# Patient Record
Sex: Female | Born: 1980 | Race: Black or African American | Hispanic: No | Smoking: Current every day smoker
Health system: Southern US, Community
[De-identification: ages and names within clinical notes are randomized; demographics above are authoritative.]

## PROBLEM LIST (undated history)

## (undated) DIAGNOSIS — D649 Anemia, unspecified: Secondary | ICD-10-CM

## (undated) DIAGNOSIS — Z973 Presence of spectacles and contact lenses: Secondary | ICD-10-CM

## (undated) DIAGNOSIS — F329 Major depressive disorder, single episode, unspecified: Secondary | ICD-10-CM

## (undated) DIAGNOSIS — N281 Cyst of kidney, acquired: Secondary | ICD-10-CM

## (undated) DIAGNOSIS — R222 Localized swelling, mass and lump, trunk: Secondary | ICD-10-CM

## (undated) DIAGNOSIS — F32A Depression, unspecified: Secondary | ICD-10-CM

## (undated) DIAGNOSIS — Z8619 Personal history of other infectious and parasitic diseases: Secondary | ICD-10-CM

## (undated) DIAGNOSIS — E7800 Pure hypercholesterolemia, unspecified: Secondary | ICD-10-CM

## (undated) DIAGNOSIS — R87629 Unspecified abnormal cytological findings in specimens from vagina: Secondary | ICD-10-CM

## (undated) DIAGNOSIS — K219 Gastro-esophageal reflux disease without esophagitis: Secondary | ICD-10-CM

## (undated) DIAGNOSIS — F419 Anxiety disorder, unspecified: Secondary | ICD-10-CM

## (undated) HISTORY — DX: Anxiety disorder, unspecified: F41.9

## (undated) HISTORY — PX: COLONOSCOPY: SHX174

## (undated) HISTORY — DX: Gastro-esophageal reflux disease without esophagitis: K21.9

## (undated) HISTORY — DX: Pure hypercholesterolemia, unspecified: E78.00

## (undated) HISTORY — PX: RESECTION OF ABDOMINAL MASS: SHX6450

## (undated) HISTORY — DX: Unspecified abnormal cytological findings in specimens from vagina: R87.629

## (undated) HISTORY — DX: Anemia, unspecified: D64.9

---

## 2010-03-25 HISTORY — PX: OTHER SURGICAL HISTORY: SHX169

## 2013-01-26 ENCOUNTER — Ambulatory Visit (INDEPENDENT_AMBULATORY_CARE_PROVIDER_SITE_OTHER): Payer: Medicaid Other | Admitting: Advanced Practice Midwife

## 2013-01-26 ENCOUNTER — Encounter: Payer: Self-pay | Admitting: Advanced Practice Midwife

## 2013-01-26 VITALS — BP 146/94 | HR 85 | Temp 97.5°F | Ht 64.0 in | Wt 253.0 lb

## 2013-01-26 DIAGNOSIS — Z6841 Body Mass Index (BMI) 40.0 and over, adult: Secondary | ICD-10-CM

## 2013-01-26 DIAGNOSIS — Z0000 Encounter for general adult medical examination without abnormal findings: Secondary | ICD-10-CM

## 2013-01-26 DIAGNOSIS — Z01419 Encounter for gynecological examination (general) (routine) without abnormal findings: Secondary | ICD-10-CM | POA: Insufficient documentation

## 2013-01-26 DIAGNOSIS — B009 Herpesviral infection, unspecified: Secondary | ICD-10-CM | POA: Insufficient documentation

## 2013-01-26 DIAGNOSIS — R319 Hematuria, unspecified: Secondary | ICD-10-CM

## 2013-01-26 DIAGNOSIS — N390 Urinary tract infection, site not specified: Secondary | ICD-10-CM

## 2013-01-26 LAB — POCT URINALYSIS DIPSTICK
Bilirubin, UA: NEGATIVE
Glucose, UA: NEGATIVE
Leukocytes, UA: NEGATIVE
Nitrite, UA: NEGATIVE
Protein, UA: NEGATIVE
Urobilinogen, UA: NEGATIVE

## 2013-01-26 NOTE — Progress Notes (Signed)
Patient is currently using birth control pills - but does not remember the name.  She also would like to check if she has a UTI.

## 2013-01-26 NOTE — Progress Notes (Addendum)
Patient ID: Ellen Booker, female   DOB: [birth year], 32 y.o.   MRN: [id number]  Chief Complaint  Patient presents with  . Gynecologic Exam    HPI Ellen Booker is a 32 y.o. female.  Here for annual exam and to establish care.  Gynecologic Exam The patient's primary symptoms include pelvic pain. Primary symptoms comment: Patient reports lower abdominal pain and hematuria 2 weeks ago w/ resolution. The problem has been resolved. She is not pregnant. Associated symptoms include abdominal pain and hematuria. Nothing aggravates the symptoms. She is sexually active. No, her partner does not have an STD. She uses oral contraceptives for contraception. Her menstrual history has been regular. Her past medical history is significant for a Cesarean section.   Patient reports recent weight gain of 15 lbs. Plans to exercise and diet for weight loss. Denies history of elevated BP.  Patient had her c-section for FTP, IOL for oligohydraminos. Has a 35 yo daughter at home. 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 Recently moved from [place] because of the cost of living.   Patient reports history of chronic BV and yeast infections. Reports resolution of BV following probiotic use. Still continues to get occasional yeast infections.  History reviewed. No pertinent past medical history.  Past Surgical History  Procedure Laterality Date  . Uterine fibroid surgery      Family History  Problem Relation Age of Onset  . Family history unknown: Yes    Social History History  Substance Use Topics  . Smoking status: Current Every Day Smoker -- 0.50 packs/day for 15 years    Types: Cigarettes  . Smokeless tobacco: Not on file  . Alcohol Use: Yes    No Known Allergies  Current Outpatient Prescriptions  Medication Sig Dispense Refill  . valACYclovir (VALTREX) 500 MG tablet Take 500 mg by mouth 2 (two) times daily.       No current facility-administered medications for this visit.    Review of  Systems Review of Systems  Constitutional: Negative.   HENT: Negative.   Eyes: Negative.   Respiratory: Negative.   Cardiovascular: Negative.   Gastrointestinal: Positive for abdominal pain.  Endocrine: Negative.   Genitourinary: Positive for hematuria and pelvic pain.  Musculoskeletal: Negative.   Skin: Negative.   Allergic/Immunologic: Negative.   Neurological: Negative.   Hematological: Negative.   Psychiatric/Behavioral: Negative.     Blood pressure 146/94, pulse 85, temperature 97.5 F (36.4 C), temperature source Oral, height 5\' 4"  (1.626 m), weight 253 lb (114.76 kg), last menstrual period 01/16/2013.  Repeat BP 127/81  Physical Exam Physical Exam  Constitutional: She is oriented to person, place, and time. She appears well-developed.  Eyes: Pupils are equal, round, and reactive to light.  Neck: Normal range of motion. Neck supple. No thyromegaly present.  Cardiovascular: Normal rate, regular rhythm and normal heart sounds.   Pulmonary/Chest: Effort normal and breath sounds normal.  Abdominal: Soft. Bowel sounds are normal.  Genitourinary: Vagina normal and uterus normal. No vaginal discharge found.  Musculoskeletal: Normal range of motion.  Neurological: She is alert and oriented to person, place, and time.  Skin: Skin is warm and dry.  Psychiatric: She has a normal mood and affect. Her behavior is normal. Judgment and thought content normal.    Data Reviewed Patient reports recent health panel labs drawn and WNL  Assessment    Patient Active Problem List   Diagnosis Date Noted  . 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 Well woman exam with routine  gynecological exam 01/26/2013  . BMI 40.0-44.9, adult 01/26/2013  . Elevated BP 01/26/2013   History of yeast infection Recent hematuria and pelvic pain w/ resolution of syptoms    Plan    Patient to RTC for BP check. If BP normal ok to refill OCP. Patient will bring in name of OCP.  Encouraged  weight loss and exercise Pap today, GC/CT Urine culture, patient to RTC if symptomatic Monitor BP, reviewed importance w/ patient.  Will renew Valtrex PRN. Reviewed methods to help prevent BV and yeast infections.   40 min spent with patient greater than 80% spent in counseling and coordination of care.      + UTI w/ culture. Called Macrobid to pharmacy 100 mg PO BID. Patient to use condoms while on OCP and antibiotic.   Ellen Booker 01/26/2013, 4:53 PM

## 2013-01-27 LAB — PAP IG, CT-NG, RFX HPV ASCU

## 2013-01-28 LAB — URINE CULTURE: Colony Count: 45000

## 2013-01-29 ENCOUNTER — Other Ambulatory Visit: Payer: Self-pay | Admitting: Advanced Practice Midwife

## 2013-01-29 DIAGNOSIS — N390 Urinary tract infection, site not specified: Secondary | ICD-10-CM | POA: Insufficient documentation

## 2013-01-29 MED ORDER — NITROFURANTOIN MONOHYD MACRO 100 MG PO CAPS: 100.0000 mg | ORAL_CAPSULE | Freq: Two times a day (BID) | ORAL | Status: DC

## 2013-01-29 MED ORDER — FLUCONAZOLE 150 MG PO TABS: 150.0000 mg | ORAL_TABLET | Freq: Once | ORAL | Status: DC

## 2013-01-29 NOTE — Addendum Note (Signed)
Addended byWilson Singer, Muzammil Bruins H on: 01/29/2013 05:53 PM   Modules accepted: Orders

## 2013-03-31 ENCOUNTER — Telehealth: Payer: Self-pay | Admitting: *Deleted

## 2013-03-31 ENCOUNTER — Ambulatory Visit (INDEPENDENT_AMBULATORY_CARE_PROVIDER_SITE_OTHER): Payer: Medicaid Other | Admitting: *Deleted

## 2013-03-31 VITALS — BP 133/81 | HR 92 | Temp 98.6°F

## 2013-03-31 DIAGNOSIS — Z0130 Encounter for examination of blood pressure without abnormal findings: Secondary | ICD-10-CM

## 2013-03-31 DIAGNOSIS — Z136 Encounter for screening for cardiovascular disorders: Secondary | ICD-10-CM

## 2013-03-31 MED ORDER — VALACYCLOVIR HCL 500 MG PO TABS: 500.0000 mg | ORAL_TABLET | Freq: Two times a day (BID) | ORAL | Status: DC

## 2013-03-31 MED ORDER — FLUCONAZOLE 150 MG PO TABS: 150.0000 mg | ORAL_TABLET | Freq: Once | ORAL | Status: DC

## 2013-03-31 NOTE — Progress Notes (Signed)
Pt in office today for BP check, 133/81

## 2013-05-31 ENCOUNTER — Other Ambulatory Visit: Payer: Self-pay | Admitting: *Deleted

## 2013-05-31 DIAGNOSIS — B379 Candidiasis, unspecified: Secondary | ICD-10-CM

## 2013-05-31 MED ORDER — FLUCONAZOLE 150 MG PO TABS: 150.0000 mg | ORAL_TABLET | Freq: Once | ORAL | Status: DC

## 2013-06-24 ENCOUNTER — Other Ambulatory Visit: Payer: Self-pay | Admitting: Advanced Practice Midwife

## 2013-06-24 DIAGNOSIS — IMO0001 Reserved for inherently not codable concepts without codable children: Secondary | ICD-10-CM

## 2013-06-24 MED ORDER — NORETHIN ACE-ETH ESTRAD-FE 1-20 MG-MCG PO TABS: 1.0000 | ORAL_TABLET | Freq: Every day | ORAL | Status: DC

## 2013-07-15 ENCOUNTER — Other Ambulatory Visit: Payer: Self-pay | Admitting: *Deleted

## 2013-07-15 DIAGNOSIS — IMO0001 Reserved for inherently not codable concepts without codable children: Secondary | ICD-10-CM

## 2013-07-15 DIAGNOSIS — B009 Herpesviral infection, unspecified: Secondary | ICD-10-CM

## 2013-07-15 MED ORDER — VALACYCLOVIR HCL 500 MG PO TABS: 500.0000 mg | ORAL_TABLET | Freq: Two times a day (BID) | ORAL | Status: DC

## 2013-07-15 MED ORDER — NORETHIN ACE-ETH ESTRAD-FE 1-20 MG-MCG PO TABS: 1.0000 | ORAL_TABLET | Freq: Every day | ORAL | Status: DC

## 2013-11-08 ENCOUNTER — Ambulatory Visit (INDEPENDENT_AMBULATORY_CARE_PROVIDER_SITE_OTHER): Payer: Medicaid Other | Admitting: Obstetrics

## 2013-11-08 ENCOUNTER — Other Ambulatory Visit: Payer: Self-pay | Admitting: Obstetrics

## 2013-11-08 ENCOUNTER — Encounter: Payer: Self-pay | Admitting: Obstetrics

## 2013-11-08 VITALS — BP 143/91 | HR 88 | Temp 98.4°F | Wt 257.0 lb

## 2013-11-08 DIAGNOSIS — N6459 Other signs and symptoms in breast: Secondary | ICD-10-CM

## 2013-11-08 DIAGNOSIS — N644 Mastodynia: Secondary | ICD-10-CM

## 2013-11-08 DIAGNOSIS — N6452 Nipple discharge: Secondary | ICD-10-CM | POA: Insufficient documentation

## 2013-11-08 DIAGNOSIS — N926 Irregular menstruation, unspecified: Secondary | ICD-10-CM | POA: Insufficient documentation

## 2013-11-08 DIAGNOSIS — N939 Abnormal uterine and vaginal bleeding, unspecified: Principal | ICD-10-CM

## 2013-11-08 NOTE — Progress Notes (Signed)
Patient ID: Ellen Booker, female   DOB: [birth year], 33 y.o.   MRN: [id number]  Chief Complaint  Patient presents with  . Breast Problem    breast leaking    HPI Ellen Booker is a 33 y.o. female.  Bleeding in between periods for last few months.  Also c/o breast pain and clear nipple discharge. HPI  History reviewed. No pertinent past medical history.  Past Surgical History  Procedure Laterality Date  . Uterine fibroid surgery      History reviewed. No pertinent family history.  Social History History  Substance Use Topics  . Smoking status: Current Every Day Smoker -- 0.50 packs/day for 15 years    Types: Cigarettes  . Smokeless tobacco: Not on file  . Alcohol Use: Yes    No Known Allergies  Current Outpatient Prescriptions  Medication Sig Dispense Refill  . norethindrone-ethinyl estradiol (JUNEL FE,GILDESS FE,LOESTRIN FE) 1-20 MG-MCG tablet Take 1 tablet by mouth daily.  1 Package  9  . valACYclovir (VALTREX) 500 MG tablet Take 1 tablet (500 mg total) by mouth 2 (two) times daily.  60 tablet  11  . fluconazole (DIFLUCAN) 150 MG tablet Take 1 tablet (150 mg total) by mouth once.  1 tablet  0   No current facility-administered medications for this visit.    Review of Systems Review of Systems Constitutional: negative for fatigue and weight loss Respiratory: negative for cough and wheezing Cardiovascular: negative for chest pain, fatigue and palpitations Gastrointestinal: negative for abdominal pain and change in bowel habits Genitourinary:  AUB, with more than 1 period per month Integument/breast: negative for nipple discharge Musculoskeletal:negative for myalgias Neurological: negative for gait problems and tremors Behavioral/Psych: negative for abusive relationship, depression Endocrine: negative for temperature intolerance     Blood pressure 143/91, pulse 88, temperature 98.4 F (36.9 C), weight 257 lb (116.574 kg), last menstrual period  10/19/2013.  Physical Exam Physical Exam General:   alert  Skin:   no rash or abnormalities  Lungs:   clear to auscultation bilaterally  Heart:   regular rate and rhythm, S1, S2 normal, no murmur, click, rub or gallop  Breasts:   normal without suspicious masses, skin or nipple changes or axillary nodes  Abdomen:  normal findings: no organomegaly, soft, non-tender and no hernia  Pelvis:  External genitalia: normal general appearance Urinary system: urethral meatus normal and bladder without fullness, nontender Vaginal: normal without tenderness, induration or masses Cervix: normal appearance Adnexa: normal bimanual exam Uterus: anteverted and non-tender, normal size      Data Reviewed Labs  Assessment    AUB. Mastodynia     Plan   Ultrasound ordered. Endometrial biopsy needed on next visit. F/U in 2 weeks.   Orders Placed This Encounter  Procedures  . WET PREP BY MOLECULAR PROBE  . GC/Chlamydia Probe Amp  . US Transvaginal Non-OB    Standing Status: Future     Number of Occurrences:      Standing Expiration Date: 01/09/2015    Order Specific Question:  Reason for Exam (SYMPTOM  OR DIAGNOSIS REQUIRED)    Answer:  AUB    Order Specific Question:  Preferred imaging location?    Answer:  [facility]  . US Pelvis Complete    Standing Status: Future     Number of Occurrences:      Standing Expiration Date: 01/09/2015    Order Specific Question:  Reason for Exam (SYMPTOM  OR DIAGNOSIS REQUIRED)    Answer:  AUB  Order Specific Question:  Preferred imaging location?    Answer[facility]  . MM Digital Screening    Standing Status: Future     Number of Occurrences:      Standing Expiration Date: 01/09/2015    Order Specific Question:  Reason for Exam (SYMPTOM  OR DIAGNOSIS REQUIRED)    Answer:  Breast pain and nipple discharge.    Order Specific Question:  Is the patient pregnant?    Answer:  No    Order Specific Question:  Preferred imaging location?     Answer[facility]  . POCT urine pregnancy   No orders of the defined types were placed in this encounter.        Faatima Tench A 11/08/2013, 5:36 PM

## 2013-11-09 LAB — WET PREP BY MOLECULAR PROBE
Candida species: NEGATIVE
GARDNERELLA VAGINALIS: NEGATIVE
Trichomonas vaginosis: NEGATIVE

## 2013-11-10 LAB — GC/CHLAMYDIA PROBE AMP
CT Probe RNA: NEGATIVE
GC Probe RNA: NEGATIVE

## 2013-11-11 ENCOUNTER — Telehealth: Payer: Self-pay | Admitting: *Deleted

## 2013-11-11 NOTE — Telephone Encounter (Signed)
Patient advised test results are normal.

## 2013-11-12 ENCOUNTER — Ambulatory Visit (HOSPITAL_COMMUNITY)
Admission: RE | Admit: 2013-11-12 | Discharge: 2013-11-12 | Disposition: A | Discharge status: Home / Self Care | Payer: Medicaid Other | Source: Ambulatory Visit | Attending: Obstetrics | Admitting: Obstetrics

## 2013-11-12 DIAGNOSIS — N938 Other specified abnormal uterine and vaginal bleeding: Secondary | ICD-10-CM | POA: Diagnosis not present

## 2013-11-12 DIAGNOSIS — N949 Unspecified condition associated with female genital organs and menstrual cycle: Secondary | ICD-10-CM | POA: Diagnosis present

## 2013-11-12 DIAGNOSIS — N926 Irregular menstruation, unspecified: Secondary | ICD-10-CM

## 2013-11-12 DIAGNOSIS — N925 Other specified irregular menstruation: Secondary | ICD-10-CM | POA: Insufficient documentation

## 2013-11-12 DIAGNOSIS — N939 Abnormal uterine and vaginal bleeding, unspecified: Secondary | ICD-10-CM

## 2013-12-01 ENCOUNTER — Ambulatory Visit: Payer: Medicaid Other | Admitting: Obstetrics

## 2013-12-01 ENCOUNTER — Ambulatory Visit: Payer: Medicaid Other | Admitting: Obstetrics & Gynecology

## 2013-12-08 ENCOUNTER — Ambulatory Visit (INDEPENDENT_AMBULATORY_CARE_PROVIDER_SITE_OTHER): Payer: Medicaid Other | Admitting: Obstetrics

## 2013-12-08 ENCOUNTER — Ambulatory Visit: Payer: Medicaid Other | Admitting: Obstetrics

## 2013-12-08 ENCOUNTER — Encounter: Payer: Self-pay | Admitting: Obstetrics

## 2013-12-08 VITALS — BP 155/91 | HR 92 | Temp 98.6°F | Ht 64.0 in | Wt 255.0 lb

## 2013-12-08 DIAGNOSIS — E669 Obesity, unspecified: Secondary | ICD-10-CM

## 2013-12-08 DIAGNOSIS — Z3041 Encounter for surveillance of contraceptive pills: Secondary | ICD-10-CM

## 2013-12-08 DIAGNOSIS — B3731 Acute candidiasis of vulva and vagina: Secondary | ICD-10-CM | POA: Insufficient documentation

## 2013-12-08 DIAGNOSIS — B373 Candidiasis of vulva and vagina: Secondary | ICD-10-CM

## 2013-12-08 DIAGNOSIS — I10 Essential (primary) hypertension: Secondary | ICD-10-CM

## 2013-12-08 MED ORDER — FLUCONAZOLE 150 MG PO TABS: 150.0000 mg | ORAL_TABLET | Freq: Once | ORAL | Status: DC

## 2013-12-09 ENCOUNTER — Other Ambulatory Visit: Payer: Self-pay | Admitting: Obstetrics

## 2013-12-09 ENCOUNTER — Encounter: Payer: Self-pay | Admitting: Obstetrics

## 2013-12-09 DIAGNOSIS — B9689 Other specified bacterial agents as the cause of diseases classified elsewhere: Secondary | ICD-10-CM

## 2013-12-09 DIAGNOSIS — N760 Acute vaginitis: Principal | ICD-10-CM

## 2013-12-09 DIAGNOSIS — I10 Essential (primary) hypertension: Secondary | ICD-10-CM | POA: Insufficient documentation

## 2013-12-09 DIAGNOSIS — E669 Obesity, unspecified: Secondary | ICD-10-CM | POA: Insufficient documentation

## 2013-12-09 LAB — WET PREP BY MOLECULAR PROBE
Candida species: POSITIVE — AB
GARDNERELLA VAGINALIS: POSITIVE — AB
TRICHOMONAS VAG: NEGATIVE

## 2013-12-09 MED ORDER — METRONIDAZOLE 500 MG PO TABS: 500.0000 mg | ORAL_TABLET | Freq: Two times a day (BID) | ORAL | Status: DC

## 2013-12-09 NOTE — Progress Notes (Addendum)
Subjective:    Ellen Booker is a 32 y.o. female who presents for contraception counseling. The patient has no complaints today. The patient is not currently sexually active. Pertinent past medical history: current smoker.  The information documented in the HPI was reviewed and verified.  Menstrual History: OB History   Grav Para Term Preterm Abortions TAB SAB Ect Mult Living   Patient's last menstrual period was 11/28/2013.    Past Medical History  Diagnosis Date   Helicobacter pylori (H. pylori)     Past Surgical History  Procedure Laterality Date   Uterine fibroid surgery      Current outpatient prescriptions:fluconazole (DIFLUCAN) 150 MG tablet, Take 1 tablet (150 mg total) by mouth once., Disp: 1 tablet, Rfl: 4;  norethindrone-ethinyl estradiol (JUNEL FE,GILDESS FE,LOESTRIN FE) 1-20 MG-MCG tablet, Take 1 tablet by mouth daily., Disp: 1 Package, Rfl: 9;  valACYclovir (VALTREX) 500 MG tablet, Take 1 tablet (500 mg total) by mouth 2 (two) times daily., Disp: 60 tablet, Rfl: 11 No Known Allergies  History  Substance Use Topics   Smoking status: Current Every Day Smoker -- 0.50 packs/day for 15 years    Types: Cigarettes   Smokeless tobacco: Never Used   Alcohol Use: Yes    History reviewed. No pertinent family history.     Review of Systems Constitutional: negative for weight loss Genitourinary:negative for abnormal menstrual periods and vaginal discharge   Objective:   BP 155/91  Pulse 92  Temp(Src) 98.6 F (37 C)  Ht  (1.626 m)  Wt 255 lb (115.667 kg)  BMI 43.75 kg/m2  LMP 11/28/2013   Lab Review Urine pregnancy test Labs reviewed yes Radiologic studies reviewed no    Assessment:    33 y.o., continuing OCP (estrogen/progesterone), contraindications smoker .  AUB Obesity Hypertension  Plan:   Stop OCP's Smoking cessation recommended.  All questions answered. Discussed healthy lifestyle modifications. Programmer, multimedia distributed. Combination E/P contraceptives not recommended with concurrent tobacco use.  Nonhormonal contraception, i. e. ParaGuard IUD would be a better choice.   Meds ordered this encounter  Medications   fluconazole (DIFLUCAN) 150 MG tablet    Sig: Take 1 tablet (150 mg total) by mouth once.    Dispense:  1 tablet    Refill:  4   Orders Placed This Encounter  Procedures   WET PREP BY MOLECULAR PROBE

## 2014-01-24 ENCOUNTER — Encounter: Payer: Self-pay | Admitting: Obstetrics

## 2014-02-01 ENCOUNTER — Encounter: Payer: Self-pay | Admitting: Obstetrics

## 2014-02-01 ENCOUNTER — Ambulatory Visit (INDEPENDENT_AMBULATORY_CARE_PROVIDER_SITE_OTHER): Payer: Medicaid Other | Admitting: Obstetrics

## 2014-02-01 VITALS — BP 137/85 | HR 80 | Temp 98.3°F | Ht 64.0 in | Wt 253.0 lb

## 2014-02-01 DIAGNOSIS — Z3041 Encounter for surveillance of contraceptive pills: Secondary | ICD-10-CM

## 2014-02-01 DIAGNOSIS — Z0000 Encounter for general adult medical examination without abnormal findings: Secondary | ICD-10-CM

## 2014-02-01 DIAGNOSIS — N946 Dysmenorrhea, unspecified: Secondary | ICD-10-CM | POA: Insufficient documentation

## 2014-02-01 DIAGNOSIS — B373 Candidiasis of vulva and vagina: Secondary | ICD-10-CM

## 2014-02-01 DIAGNOSIS — E669 Obesity, unspecified: Secondary | ICD-10-CM

## 2014-02-01 DIAGNOSIS — F17200 Nicotine dependence, unspecified, uncomplicated: Secondary | ICD-10-CM | POA: Insufficient documentation

## 2014-02-01 DIAGNOSIS — B3731 Acute candidiasis of vulva and vagina: Secondary | ICD-10-CM

## 2014-02-01 MED ORDER — PHENTERMINE HCL 37.5 MG PO CAPS: 37.5000 mg | ORAL_CAPSULE | ORAL | Status: DC

## 2014-02-01 MED ORDER — FLUCONAZOLE 150 MG PO TABS: 150.0000 mg | ORAL_TABLET | Freq: Once | ORAL | Status: DC

## 2014-02-01 MED ORDER — IBUPROFEN 800 MG PO TABS: 800.0000 mg | ORAL_TABLET | Freq: Three times a day (TID) | ORAL | Status: DC | PRN

## 2014-02-01 NOTE — Progress Notes (Signed)
Subjective:     Ellen Booker is a 33 y.o. female here for a routine exam.  Current complaints: Left inguinal pain with period .    Personal health questionnaire:  Is patient [name], have a family history of breast and/or ovarian cancer: no Is there a family history of uterine cancer diagnosed at age < 5250, gastrointestinal cancer, urinary tract cancer, family member who is a Personnel officerLynch syndrome-associated carrier: no Is the patient overweight and hypertensive, family history of diabetes, personal history of gestational diabetes or PCOS: no Is patient over 8555, have PCOS,  family history of premature CHD under age 165, diabetes, smoke, have hypertension or peripheral artery disease:  no At any time, has a partner hit, kicked or otherwise hurt or frightened you?: no Over the past 2 weeks, have you felt down, depressed or hopeless?: no Over the past 2 weeks, have you felt little interest or pleasure in doing things?:no   Gynecologic History Patient's last menstrual period was 01/24/2014. Contraception: none Last Pap: 2014. Results were: normal Last mammogram: n/a. Results were: n/a  Obstetric History OB History  Gravida Para Term Preterm AB SAB TAB Ectopic Multiple Living  1 1 1       1     # Outcome Date GA Lbr Len/2nd Weight Sex Delivery Anes PTL Lv  1 Term  [id number]    [id number]   Y      Past Medical History  Diagnosis Date  . Helicobacter pylori (H. pylori)     Past Surgical History  Procedure Laterality Date  . Uterine fibroid surgery      Current outpatient prescriptions: valACYclovir (VALTREX) 500 MG tablet, Take 1 tablet (500 mg total) by mouth 2 (two) times daily., Disp: 60 tablet, Rfl: 11;  fluconazole (DIFLUCAN) 150 MG tablet, Take 1 tablet (150 mg total) by mouth once., Disp: 1 tablet, Rfl: 4;  ibuprofen (ADVIL,MOTRIN) 800 MG tablet, Take 1 tablet (800 mg total) by mouth every 8 (eight) hours as needed., Disp: 30 tablet, Rfl: 5 phentermine 37.5 MG capsule, Take 1  capsule (37.5 mg total) by mouth every morning., Disp: 1 capsule, Rfl: 2 No Known Allergies  History  Substance Use Topics  . Smoking status: Current Every Day Smoker -- 0.50 packs/day for 15 years    Types: Cigarettes  . Smokeless tobacco: Never Used  . Alcohol Use: No    History reviewed. No pertinent family history.    Review of Systems  Constitutional: negative for fatigue and weight loss Respiratory: negative for cough and wheezing Cardiovascular: negative for chest pain, fatigue and palpitations Gastrointestinal: negative for abdominal pain and change in bowel habits Musculoskeletal:negative for myalgias Neurological: negative for gait problems and tremors Behavioral/Psych: negative for abusive relationship, depression Endocrine: negative for temperature intolerance   Genitourinary:negative for abnormal menstrual periods, genital lesions, hot flashes, sexual problems and vaginal discharge Integument/breast: negative for breast lump, breast tenderness, nipple discharge and skin lesion(s)    Objective:       BP 137/85 mmHg  Pulse 80  Temp(Src) 98.3 F (36.8 C)  Ht 5\' 4"  (1.626 m)  Wt 253 lb (114.76 kg)  BMI 43.41 kg/m2  LMP 01/24/2014 General:   alert  Skin:   no rash or abnormalities  Lungs:   clear to auscultation bilaterally  Heart:   regular rate and rhythm, S1, S2 normal, no murmur, click, rub or gallop  Breasts:   normal without suspicious masses, skin or nipple changes or axillary nodes  Abdomen:  normal findings: no  organomegaly, soft, non-tender and no hernia  Pelvis:  External genitalia: normal general appearance Urinary system: urethral meatus normal and bladder without fullness, nontender Vaginal: normal without tenderness, induration or masses Cervix: normal appearance Adnexa: normal bimanual exam Uterus: anteverted and non-tender, normal size   Lab Review Urine pregnancy test Labs reviewed yes Radiologic studies reviewed yes    Assessment:     Healthy female exam.    H/O AUB, with irregular cycles.  Dysmenorrhea  Obesity  Contraception.  Started on OCP's but has tobacco dependency.  Must D/C OCP's if she can't stop smoking.    Plan:    Education reviewed: calcium supplements, low fat, low cholesterol diet, safe sex/STD prevention, self breast exams, smoking cessation and weight bearing exercise. Contraception: OCP (estrogen/progesterone). Follow up in: 1 year.   Meds ordered this encounter  Medications  . ibuprofen (ADVIL,MOTRIN) 800 MG tablet    Sig: Take 1 tablet (800 mg total) by mouth every 8 (eight) hours as needed.    Dispense:  30 tablet    Refill:  5  . phentermine 37.5 MG capsule    Sig: Take 1 capsule (37.5 mg total) by mouth every morning.    Dispense:  1 capsule    Refill:  2  . fluconazole (DIFLUCAN) 150 MG tablet    Sig: Take 1 tablet (150 mg total) by mouth once.    Dispense:  1 tablet    Refill:  4   Orders Placed This Encounter  Procedures  . WET PREP BY MOLECULAR PROBE  . GC/Chlamydia Probe Amp

## 2014-02-02 ENCOUNTER — Other Ambulatory Visit: Payer: Self-pay | Admitting: Obstetrics

## 2014-02-02 DIAGNOSIS — B9689 Other specified bacterial agents as the cause of diseases classified elsewhere: Secondary | ICD-10-CM

## 2014-02-02 DIAGNOSIS — N760 Acute vaginitis: Principal | ICD-10-CM

## 2014-02-02 LAB — GC/CHLAMYDIA PROBE AMP
CT Probe RNA: NEGATIVE
GC Probe RNA: NEGATIVE

## 2014-02-02 LAB — WET PREP BY MOLECULAR PROBE
Candida species: NEGATIVE
GARDNERELLA VAGINALIS: NEGATIVE
Trichomonas vaginosis: NEGATIVE

## 2014-02-02 MED ORDER — METRONIDAZOLE 500 MG PO TABS: 500.0000 mg | ORAL_TABLET | Freq: Two times a day (BID) | ORAL | Status: DC

## 2014-02-03 LAB — PAP IG AND HPV HIGH-RISK: HPV DNA HIGH RISK: NOT DETECTED

## 2014-02-28 ENCOUNTER — Telehealth: Payer: Self-pay | Admitting: *Deleted

## 2014-02-28 DIAGNOSIS — B9689 Other specified bacterial agents as the cause of diseases classified elsewhere: Secondary | ICD-10-CM

## 2014-02-28 DIAGNOSIS — N760 Acute vaginitis: Principal | ICD-10-CM

## 2014-02-28 MED ORDER — METRONIDAZOLE 500 MG PO TABS: 500.0000 mg | ORAL_TABLET | Freq: Two times a day (BID) | ORAL | Status: DC

## 2014-02-28 NOTE — Telephone Encounter (Signed)
Patient states she is having a discharge with a foul odor. Patient states she believes it is BV. Per nursing protocol prescription to be sent to the phramacy for Metronidazole.  Patient aware and verbalized understanding.

## 2014-03-21 ENCOUNTER — Encounter: Payer: Self-pay | Admitting: *Deleted

## 2014-03-22 ENCOUNTER — Encounter: Payer: Self-pay | Admitting: Obstetrics & Gynecology

## 2014-04-27 ENCOUNTER — Ambulatory Visit: Payer: Medicaid Other | Admitting: Obstetrics

## 2014-04-27 ENCOUNTER — Telehealth: Payer: Self-pay | Admitting: *Deleted

## 2014-04-27 NOTE — Telephone Encounter (Signed)
Patient states she has an appointment today at 2:30 and she has just had a positive UPT. Patient wants to know if she can keep her appointment because she wants to discuss her options. Called patient advised to keep appoinment.

## 2014-05-19 ENCOUNTER — Ambulatory Visit (INDEPENDENT_AMBULATORY_CARE_PROVIDER_SITE_OTHER): Payer: Medicaid Other | Admitting: Obstetrics

## 2014-05-19 ENCOUNTER — Encounter: Payer: Self-pay | Admitting: Obstetrics

## 2014-05-19 VITALS — BP 140/92 | HR 92 | Temp 98.8°F | Ht 64.0 in | Wt 235.0 lb

## 2014-05-19 DIAGNOSIS — R109 Unspecified abdominal pain: Secondary | ICD-10-CM

## 2014-05-19 DIAGNOSIS — N946 Dysmenorrhea, unspecified: Secondary | ICD-10-CM

## 2014-05-19 LAB — POCT URINE PREGNANCY: PREG TEST UR: NEGATIVE

## 2014-05-20 ENCOUNTER — Encounter: Payer: Self-pay | Admitting: Obstetrics

## 2014-05-20 NOTE — Progress Notes (Signed)
Patient ID: Ellen Booker, female   DOB: 1980/12/31, 34 y.o.   MRN: [id number]  Chief Complaint  Patient presents with  . Problem    Abdominal Pain     HPI Ellen Booker is a 34 y.o. female.  Pelvic pain over the past few months.  H/O uterine fibroids.  S/P myomectomy.  H/O positive H. Pylori.  Pain is just before, during and after period. HPI  Past Medical History  Diagnosis Date  . Helicobacter pylori (H. pylori)     Past Surgical History  Procedure Laterality Date  . Uterine fibroid surgery      History reviewed. No pertinent family history.  Social History History  Substance Use Topics  . Smoking status: Current Every Day Smoker -- 0.50 packs/day for 15 years    Types: Cigarettes  . Smokeless tobacco: Never Used  . Alcohol Use: Yes    No Known Allergies  Current Outpatient Prescriptions  Medication Sig Dispense Refill  . fluconazole (DIFLUCAN) 150 MG tablet Take 1 tablet (150 mg total) by mouth once. 1 tablet 4  . metroNIDAZOLE (FLAGYL) 500 MG tablet Take 1 tablet (500 mg total) by mouth 2 (two) times daily. 14 tablet 2  . valACYclovir (VALTREX) 500 MG tablet Take 1 tablet (500 mg total) by mouth 2 (two) times daily. 60 tablet 11  . ibuprofen (ADVIL,MOTRIN) 800 MG tablet Take 1 tablet (800 mg total) by mouth every 8 (eight) hours as needed. (Patient not taking: Reported on 05/19/2014) 30 tablet 5  . phentermine 37.5 MG capsule Take 1 capsule (37.5 mg total) by mouth every morning. (Patient not taking: Reported on 05/19/2014) 1 capsule 2   No current facility-administered medications for this visit.    Review of Systems Review of Systems Constitutional: negative for fatigue and weight loss Respiratory: negative for cough and wheezing Cardiovascular: negative for chest pain, fatigue and palpitations Gastrointestinal: negative for abdominal pain and change in bowel habits Genitourinary:negative Integument/breast: negative for nipple  discharge Musculoskeletal:negative for myalgias Neurological: negative for gait problems and tremors Behavioral/Psych: negative for abusive relationship, depression Endocrine: negative for temperature intolerance     Blood pressure 140/92, pulse 92, temperature 98.8 F (37.1 C), height 5\' 4"  (1.626 m), weight 235 lb (106.595 kg), last menstrual period 05/02/2014.  Physical Exam Physical Exam General:   alert  Skin:   no rash or abnormalities  Lungs:   clear to auscultation bilaterally  Heart:   regular rate and rhythm, S1, S2 normal, no murmur, click, rub or gallop  Breasts:   normal without suspicious masses, skin or nipple changes or axillary nodes  Abdomen:  normal findings: no organomegaly, soft, non-tender and no hernia  Pelvis:  External genitalia: normal general appearance Urinary system: urethral meatus normal and bladder without fullness, nontender Vaginal: normal without tenderness, induration or masses Cervix: normal appearance Adnexa: normal bimanual exam Uterus: anteverted and non-tender, normal size      Data Reviewed Labs U/S  Assessment     Dysmenorrhea.  H/O fibroids.     Plan    Schedule ultrasound.   Orders Placed This Encounter  Procedures  . SureSwab, Vaginosis/Vaginitis Plus  . POCT urine pregnancy   No orders of the defined types were placed in this encounter.

## 2014-05-23 LAB — SURESWAB, VAGINOSIS/VAGINITIS PLUS
Atopobium vaginae: NOT DETECTED Log (cells/mL)
C. ALBICANS, DNA: NOT DETECTED
C. GLABRATA, DNA: NOT DETECTED
C. parapsilosis, DNA: NOT DETECTED
C. trachomatis RNA, TMA: NOT DETECTED
C. tropicalis, DNA: NOT DETECTED
LACTOBACILLUS SPECIES: NOT DETECTED Log (cells/mL)
MEGASPHAERA SPECIES: NOT DETECTED Log (cells/mL)
N. gonorrhoeae RNA, TMA: NOT DETECTED
T. VAGINALIS RNA, QL TMA: NOT DETECTED

## 2014-06-01 ENCOUNTER — Ambulatory Visit: Payer: Medicaid Other | Admitting: Obstetrics

## 2014-06-01 ENCOUNTER — Other Ambulatory Visit: Payer: Medicaid Other

## 2014-06-07 ENCOUNTER — Other Ambulatory Visit: Payer: Self-pay | Admitting: *Deleted

## 2014-06-07 DIAGNOSIS — B9689 Other specified bacterial agents as the cause of diseases classified elsewhere: Secondary | ICD-10-CM

## 2014-06-07 DIAGNOSIS — N760 Acute vaginitis: Principal | ICD-10-CM

## 2014-06-07 MED ORDER — METRONIDAZOLE 500 MG PO TABS: 500.0000 mg | ORAL_TABLET | Freq: Two times a day (BID) | ORAL | Status: DC

## 2014-06-07 NOTE — Progress Notes (Signed)
Refill request sent from pharmacy for Metronidazole. Per Dr Clearance CootsHarper, ok to sent Rx with 2 refills.

## 2014-06-08 ENCOUNTER — Ambulatory Visit (INDEPENDENT_AMBULATORY_CARE_PROVIDER_SITE_OTHER): Payer: Medicaid Other

## 2014-06-08 ENCOUNTER — Ambulatory Visit (INDEPENDENT_AMBULATORY_CARE_PROVIDER_SITE_OTHER): Payer: Medicaid Other | Admitting: Obstetrics

## 2014-06-08 VITALS — BP 122/80 | HR 75 | Wt 233.0 lb

## 2014-06-08 DIAGNOSIS — N946 Dysmenorrhea, unspecified: Secondary | ICD-10-CM

## 2014-06-08 DIAGNOSIS — R109 Unspecified abdominal pain: Secondary | ICD-10-CM

## 2014-06-09 ENCOUNTER — Encounter: Payer: Self-pay | Admitting: Obstetrics

## 2014-06-09 NOTE — Progress Notes (Signed)
Patient ID: Ellen QuintonVanessa Goehring, female   DOB: 10-28-1980, 34 y.o.   MRN: [id number]  Chief Complaint  Patient presents with  . Follow-up    ultrasound results    HPI Ellen Booker is a 34 y.o. female.  H/O dysmenorrhea.  Follow up after ultrasound.  HPI  Past Medical History  Diagnosis Date  . Helicobacter pylori (H. pylori)     Past Surgical History  Procedure Laterality Date  . Uterine fibroid surgery      History reviewed. No pertinent family history.  Social History History  Substance Use Topics  . Smoking status: Current Every Day Smoker -- 0.50 packs/day for 15 years    Types: Cigarettes  . Smokeless tobacco: Never Used  . Alcohol Use: Yes    No Known Allergies  Current Outpatient Prescriptions  Medication Sig Dispense Refill  . fluconazole (DIFLUCAN) 150 MG tablet Take 1 tablet (150 mg total) by mouth once. (Patient not taking: Reported on 06/08/2014) 1 tablet 4  . ibuprofen (ADVIL,MOTRIN) 800 MG tablet Take 1 tablet (800 mg total) by mouth every 8 (eight) hours as needed. (Patient not taking: Reported on 05/19/2014) 30 tablet 5  . metroNIDAZOLE (FLAGYL) 500 MG tablet Take 1 tablet (500 mg total) by mouth 2 (two) times daily. (Patient not taking: Reported on 06/08/2014) 14 tablet 2  . phentermine 37.5 MG capsule Take 1 capsule (37.5 mg total) by mouth every morning. (Patient not taking: Reported on 05/19/2014) 1 capsule 2  . valACYclovir (VALTREX) 500 MG tablet Take 1 tablet (500 mg total) by mouth 2 (two) times daily. (Patient not taking: Reported on 06/08/2014) 60 tablet 11   No current facility-administered medications for this visit.    Review of Systems Review of Systems Constitutional: negative for fatigue and weight loss Respiratory: negative for cough and wheezing Cardiovascular: negative for chest pain, fatigue and palpitations Gastrointestinal: negative for abdominal pain and change in bowel habits Genitourinary:negative Integument/breast: negative for  nipple discharge Musculoskeletal:negative for myalgias Neurological: negative for gait problems and tremors Behavioral/Psych: negative for abusive relationship, depression Endocrine: negative for temperature intolerance     Blood pressure 122/80, pulse 75, weight 233 lb (105.688 kg), last menstrual period 05/21/2014.  Physical Exam Physical Exam:  Deferred  100% of 10 min visit spent on counseling and coordination of care.   Data Reviewed Labs  Assessment     Dysmenorrhea.  Chronic abdominal pain.  Probable GI etiology    Plan    Continue  NSAIDS.  No orders of the defined types were placed in this encounter.   No orders of the defined types were placed in this encounter.

## 2014-07-01 ENCOUNTER — Other Ambulatory Visit: Payer: Self-pay | Admitting: *Deleted

## 2014-07-01 NOTE — Telephone Encounter (Signed)
Patient is requesting a refill on Phentermine.  Attempted to contact the patient and left message advising patient would forward request to Dr. Clearance CootsHarper and contact her as soon as he advised on what to do.

## 2014-07-01 NOTE — Telephone Encounter (Signed)
Denied.

## 2014-07-06 NOTE — Telephone Encounter (Signed)
Patient left message stating she was not contacted regarding her call Friday.  2:36 Call to patient- LM on VM- Attempt was made to call patient to let her know refill request was denied.

## 2014-07-06 NOTE — Telephone Encounter (Signed)
Attempted to contact patient and left message for patient to call the office.  

## 2014-07-25 ENCOUNTER — Telehealth: Payer: Self-pay | Admitting: *Deleted

## 2014-07-25 NOTE — Telephone Encounter (Signed)
Fax received requesting a refill of Diflucan. Please advise.

## 2014-07-26 ENCOUNTER — Other Ambulatory Visit: Payer: Self-pay | Admitting: Advanced Practice Midwife

## 2014-07-27 ENCOUNTER — Other Ambulatory Visit: Payer: Self-pay | Admitting: *Deleted

## 2014-07-27 DIAGNOSIS — B3731 Acute candidiasis of vulva and vagina: Secondary | ICD-10-CM

## 2014-07-27 DIAGNOSIS — B373 Candidiasis of vulva and vagina: Secondary | ICD-10-CM

## 2014-07-27 MED ORDER — FLUCONAZOLE 150 MG PO TABS: 150.0000 mg | ORAL_TABLET | Freq: Once | ORAL | Status: DC

## 2014-07-27 NOTE — Telephone Encounter (Signed)
Rx RF. Patient notified.

## 2014-07-27 NOTE — Telephone Encounter (Signed)
OK for refill.

## 2014-07-28 NOTE — Telephone Encounter (Signed)
Patient has not returned call to the office. Call to be re-filed. 

## 2014-09-14 ENCOUNTER — Ambulatory Visit (INDEPENDENT_AMBULATORY_CARE_PROVIDER_SITE_OTHER): Payer: Medicaid Other | Admitting: Obstetrics

## 2014-09-14 ENCOUNTER — Encounter: Payer: Self-pay | Admitting: Obstetrics

## 2014-09-14 VITALS — BP 138/87 | HR 75 | Temp 98.1°F | Ht 64.0 in | Wt 234.0 lb

## 2014-09-14 DIAGNOSIS — B3731 Acute candidiasis of vulva and vagina: Secondary | ICD-10-CM

## 2014-09-14 DIAGNOSIS — E289 Ovarian dysfunction, unspecified: Secondary | ICD-10-CM

## 2014-09-14 DIAGNOSIS — L680 Hirsutism: Secondary | ICD-10-CM

## 2014-09-14 DIAGNOSIS — B373 Candidiasis of vulva and vagina: Secondary | ICD-10-CM

## 2014-09-14 DIAGNOSIS — A499 Bacterial infection, unspecified: Secondary | ICD-10-CM

## 2014-09-14 DIAGNOSIS — N760 Acute vaginitis: Secondary | ICD-10-CM

## 2014-09-14 DIAGNOSIS — B9689 Other specified bacterial agents as the cause of diseases classified elsewhere: Secondary | ICD-10-CM

## 2014-09-14 MED ORDER — METRONIDAZOLE 500 MG PO TABS: 500.0000 mg | ORAL_TABLET | Freq: Two times a day (BID) | ORAL | Status: DC

## 2014-09-14 MED ORDER — CLOMIPHENE CITRATE 50 MG PO TABS: ORAL_TABLET | ORAL | Status: DC

## 2014-09-14 MED ORDER — FLUCONAZOLE 150 MG PO TABS: 150.0000 mg | ORAL_TABLET | Freq: Once | ORAL | Status: DC

## 2014-09-14 NOTE — Addendum Note (Signed)
Addended by: Henriette Combs on: 09/14/2014 05:25 PM   Modules accepted: Orders

## 2014-09-14 NOTE — Progress Notes (Signed)
Patient ID: Ellen Booker, female   DOB: [birth year], 34 y.o.   MRN: [id number]  Chief Complaint  Patient presents with  . Vaginitis    HPI Ellen Booker is a 34 y.o. female.  Malodorous vaginal discharge.  Denies itching or dysuria.  Continues to have LLQ pelvic pain for which ultrasound has been negative.  Pain is thought to relate to ovulation.  Patient concerned about recurrent BV.  Has also been trying unsuccessfully over the past year to get pregnant.  Clomid has worked in the past to achieve pregnancy, and wants to try it again. HPI  Past Medical History  Diagnosis Date  . Helicobacter pylori (H. pylori)     Past Surgical History  Procedure Laterality Date  . Uterine fibroid surgery      History reviewed. No pertinent family history.  Social History History  Substance Use Topics  . Smoking status: Current Every Day Smoker -- 0.50 packs/day for 15 years    Types: Cigarettes  . Smokeless tobacco: Never Used  . Alcohol Use: No    No Known Allergies  Current Outpatient Prescriptions  Medication Sig Dispense Refill  . fluconazole (DIFLUCAN) 150 MG tablet Take 1 tablet (150 mg total) by mouth once. 1 tablet 6  . metroNIDAZOLE (FLAGYL) 500 MG tablet Take 1 tablet (500 mg total) by mouth 2 (two) times daily. 14 tablet 6  . clomiPHENE (CLOMID) 50 MG tablet Take 1 tablet daily on days 5 - 9 after the 1st day of period.  May repeat x 1. 5 tablet 1  . ibuprofen (ADVIL,MOTRIN) 800 MG tablet Take 1 tablet (800 mg total) by mouth every 8 (eight) hours as needed. (Patient not taking: Reported on 05/19/2014) 30 tablet 5  . phentermine 37.5 MG capsule Take 1 capsule (37.5 mg total) by mouth every morning. (Patient not taking: Reported on 05/19/2014) 1 capsule 2  . valACYclovir (VALTREX) 500 MG tablet take 1 tablet by mouth twice a day (Patient not taking: Reported on 09/14/2014) 60 tablet 11   No current facility-administered medications for this visit.    Review of Systems Review of  Systems Constitutional: negative for fatigue and weight loss Respiratory: negative for cough and wheezing Cardiovascular: negative for chest pain, fatigue and palpitations Gastrointestinal: negative for abdominal pain and change in bowel habits Genitourinary:negative Integument/breast: negative for nipple discharge Musculoskeletal:negative for myalgias Neurological: negative for gait problems and tremors Behavioral/Psych: negative for abusive relationship, depression Endocrine: negative for temperature intolerance     Blood pressure 138/87, pulse 75, temperature 98.1 F (36.7 C), height  (1.626 m), weight 234 lb (106.142 kg), last menstrual period 09/02/2014.  Physical Exam Physical Exam General:   alert  Skin:   stubby facial hair from shaving  Lungs:   clear to auscultation bilaterally  Heart:   regular rate and rhythm, S1, S2 normal, no murmur, click, rub or gallop  Breasts:   normal without suspicious masses, skin or nipple changes or axillary nodes  Abdomen:  normal findings: no organomegaly, soft, non-tender and no hernia  Pelvis:  External genitalia: normal general appearance Urinary system: urethral meatus normal and bladder without fullness, nontender Vaginal: normal without tenderness, induration or masses.  Wallace Cullens, thin discharge Cervix: normal appearance Adnexa: normal bimanual exam Uterus: anteverted and non-tender, normal size      Data Reviewed Labs Ultrasound  Assessment     BV, recurrent Ovulatory dysfunction, probably related to PCOS.  Hirsutism    Plan    Metronidazole Rx, chronic treatment monthly after  the period x 3 Clomid Rx Metformin recommended but patient refused. F/U 5 months for annual  No orders of the defined types were placed in this encounter.   Meds ordered this encounter  Medications  . metroNIDAZOLE (FLAGYL) 500 MG tablet    Sig: Take 1 tablet (500 mg total) by mouth 2 (two) times daily.    Dispense:  14 tablet    Refill:   6  . clomiPHENE (CLOMID) 50 MG tablet    Sig: Take 1 tablet daily on days 5 - 9 after the 1st day of period.  May repeat x 1.    Dispense:  5 tablet    Refill:  1  . fluconazole (DIFLUCAN) 150 MG tablet    Sig: Take 1 tablet (150 mg total) by mouth once.    Dispense:  1 tablet    Refill:  6

## 2014-09-17 LAB — SURESWAB, VAGINOSIS/VAGINITIS PLUS
Atopobium vaginae: 6.7 Log (cells/mL)
BV CATEGORY: UNDETERMINED — AB
C. TROPICALIS, DNA: NOT DETECTED
C. albicans, DNA: DETECTED — AB
C. glabrata, DNA: NOT DETECTED
C. parapsilosis, DNA: NOT DETECTED
C. trachomatis RNA, TMA: NOT DETECTED
LACTOBACILLUS SPECIES: 7.1 Log (cells/mL)
MEGASPHAERA SPECIES: NOT DETECTED Log (cells/mL)
N. gonorrhoeae RNA, TMA: NOT DETECTED
T. vaginalis RNA, QL TMA: NOT DETECTED

## 2014-09-18 ENCOUNTER — Other Ambulatory Visit: Payer: Self-pay | Admitting: Obstetrics

## 2014-09-18 DIAGNOSIS — N760 Acute vaginitis: Secondary | ICD-10-CM

## 2014-09-18 DIAGNOSIS — B9689 Other specified bacterial agents as the cause of diseases classified elsewhere: Secondary | ICD-10-CM

## 2014-09-18 DIAGNOSIS — B373 Candidiasis of vulva and vagina: Secondary | ICD-10-CM

## 2014-09-18 DIAGNOSIS — B3731 Acute candidiasis of vulva and vagina: Secondary | ICD-10-CM

## 2014-09-18 MED ORDER — TINIDAZOLE 500 MG PO TABS: 1000.0000 mg | ORAL_TABLET | Freq: Every day | ORAL | Status: DC

## 2014-09-18 MED ORDER — FLUCONAZOLE 150 MG PO TABS: 150.0000 mg | ORAL_TABLET | Freq: Once | ORAL | Status: DC

## 2014-09-19 ENCOUNTER — Telehealth: Payer: Self-pay | Admitting: *Deleted

## 2014-09-19 NOTE — Telephone Encounter (Signed)
Pt called office for lab results.  Return call to pt.  Pt made aware of lab results.  Pt was given Metronidazole at time of visit.  Pt was made aware that she also had Diflucan ordered.  Pt advised to complete Metronidazole before taking Diflucan.

## 2014-10-17 ENCOUNTER — Encounter: Payer: Self-pay | Admitting: Obstetrics

## 2014-10-17 ENCOUNTER — Ambulatory Visit (INDEPENDENT_AMBULATORY_CARE_PROVIDER_SITE_OTHER): Payer: Medicaid Other | Admitting: Obstetrics

## 2014-10-17 VITALS — BP 134/88 | HR 75 | Temp 98.4°F | Ht 64.0 in

## 2014-10-17 DIAGNOSIS — N946 Dysmenorrhea, unspecified: Secondary | ICD-10-CM

## 2014-10-17 DIAGNOSIS — A499 Bacterial infection, unspecified: Secondary | ICD-10-CM

## 2014-10-17 DIAGNOSIS — Z3169 Encounter for other general counseling and advice on procreation: Secondary | ICD-10-CM

## 2014-10-17 DIAGNOSIS — B9689 Other specified bacterial agents as the cause of diseases classified elsewhere: Secondary | ICD-10-CM

## 2014-10-17 DIAGNOSIS — E669 Obesity, unspecified: Secondary | ICD-10-CM

## 2014-10-17 DIAGNOSIS — E289 Ovarian dysfunction, unspecified: Secondary | ICD-10-CM

## 2014-10-17 DIAGNOSIS — Z3202 Encounter for pregnancy test, result negative: Secondary | ICD-10-CM | POA: Diagnosis not present

## 2014-10-17 DIAGNOSIS — N760 Acute vaginitis: Secondary | ICD-10-CM | POA: Diagnosis not present

## 2014-10-17 DIAGNOSIS — R1030 Lower abdominal pain, unspecified: Secondary | ICD-10-CM

## 2014-10-17 DIAGNOSIS — Z113 Encounter for screening for infections with a predominantly sexual mode of transmission: Secondary | ICD-10-CM

## 2014-10-17 LAB — POCT URINALYSIS DIPSTICK
BILIRUBIN UA: NEGATIVE
Blood, UA: NEGATIVE
GLUCOSE UA: NEGATIVE
Ketones, UA: NEGATIVE
LEUKOCYTES UA: NEGATIVE
NITRITE UA: NEGATIVE
Protein, UA: NEGATIVE
Spec Grav, UA: <=1.005
Urobilinogen, UA: NEGATIVE
pH, UA: 5

## 2014-10-17 LAB — POCT URINE PREGNANCY: Preg Test, Ur: NEGATIVE

## 2014-10-17 MED ORDER — NAPROXEN SODIUM 550 MG PO TABS: 550.0000 mg | ORAL_TABLET | Freq: Two times a day (BID) | ORAL | Status: DC

## 2014-10-17 MED ORDER — OB COMPLETE PETITE 35-5-1-200 MG PO CAPS: 1.0000 | ORAL_CAPSULE | Freq: Every day | ORAL | Status: DC

## 2014-10-17 NOTE — Progress Notes (Signed)
Patient ID: Ellen Booker, female   DOB: 1980/09/20, 34 y.o.   MRN: [id number]  Chief Complaint  Patient presents with  . Abdominal Pain    HPI Ellen Booker is a 34 y.o. female.  Presents with lower abdominal pain and pain with intercourse.  On Clomid x 1 cycle.  HPI  Past Medical History  Diagnosis Date  . Helicobacter pylori (H. pylori)     Past Surgical History  Procedure Laterality Date  . Uterine fibroid surgery      Family History  Problem Relation Age of Onset  . HIV Father     Social History History  Substance Use Topics  . Smoking status: Current Every Day Smoker -- 0.50 packs/day for 15 years    Types: Cigarettes  . Smokeless tobacco: Never Used  . Alcohol Use: No    No Known Allergies  Current Outpatient Prescriptions  Medication Sig Dispense Refill  . clomiPHENE (CLOMID) 50 MG tablet Take 1 tablet daily on days 5 - 9 after the 1st day of period.  May repeat x 1. (Patient not taking: Reported on 10/17/2014) 5 tablet 1  . naproxen sodium (ANAPROX DS) 550 MG tablet Take 1 tablet (550 mg total) by mouth 2 (two) times daily with a meal. 30 tablet 11  . Prenat-FeCbn-FeAspGl-FA-Omega (OB COMPLETE PETITE) 35-5-1-200 MG CAPS Take 1 capsule by mouth daily before breakfast. 90 capsule 3  . valACYclovir (VALTREX) 500 MG tablet take 1 tablet by mouth twice a day (Patient not taking: Reported on 09/14/2014) 60 tablet 11   No current facility-administered medications for this visit.    Review of Systems Review of Systems Constitutional: negative for fatigue and weight loss Respiratory: negative for cough and wheezing Cardiovascular: negative for chest pain, fatigue and palpitations Gastrointestinal: positive for abdominal pain  Genitourinary:negative Integument/breast: negative for nipple discharge Musculoskeletal:negative for myalgias Neurological: negative for gait problems and tremors Behavioral/Psych: negative for abusive relationship, depression Endocrine:  negative for temperature intolerance     Blood pressure 134/88, pulse 75, temperature 98.4 F (36.9 C), height  (1.626 m), last menstrual period 09/20/2014.  Physical Exam Physical Exam            General:  Alert and no distress. Abdomen:  normal findings: no organomegaly, soft, non-tender and no hernia  Pelvis:  External genitalia: normal general appearance Urinary system: urethral meatus normal and bladder without fullness, nontender Vaginal: normal without tenderness, induration or masses Cervix: normal appearance Adnexa: normal bimanual exam Uterus: anteverted and non-tender, normal size      Data Reviewed UCG negative  Assessment     Pelvic pain H/O ovarian dysfunction, on Clomid.  Pain may be ovarian in origin but has now resolved. Dyspareunia.  Resolved.    Plan    Will follow clinically.  Anaprox DS Rx prn.   Orders Placed This Encounter  Procedures  . POCT urinalysis dipstick  . POCT urine pregnancy   Meds ordered this encounter  Medications  . naproxen sodium (ANAPROX DS) 550 MG tablet    Sig: Take 1 tablet (550 mg total) by mouth 2 (two) times daily with a meal.    Dispense:  30 tablet    Refill:  11  . Prenat-FeCbn-FeAspGl-FA-Omega (OB COMPLETE PETITE) 35-5-1-200 MG CAPS    Sig: Take 1 capsule by mouth daily before breakfast.    Dispense:  90 capsule    Refill:  3

## 2014-10-18 ENCOUNTER — Other Ambulatory Visit: Payer: Self-pay | Admitting: *Deleted

## 2014-10-18 DIAGNOSIS — Z3169 Encounter for other general counseling and advice on procreation: Secondary | ICD-10-CM

## 2014-10-18 MED ORDER — OB COMPLETE PETITE 35-5-1-200 MG PO CAPS: 1.0000 | ORAL_CAPSULE | Freq: Every day | ORAL | Status: DC

## 2014-10-19 ENCOUNTER — Other Ambulatory Visit: Payer: Self-pay | Admitting: Obstetrics

## 2014-10-19 DIAGNOSIS — A5403 Gonococcal cervicitis, unspecified: Secondary | ICD-10-CM

## 2014-10-19 LAB — GC/CHLAMYDIA PROBE AMP
CT Probe RNA: NEGATIVE
GC Probe RNA: POSITIVE — AB

## 2014-10-19 MED ORDER — CEFTRIAXONE SODIUM 250 MG IJ SOLR: 250.0000 mg | Freq: Once | INTRAMUSCULAR | Status: DC

## 2014-10-19 MED ORDER — AZITHROMYCIN 250 MG PO TABS: 1000.0000 mg | ORAL_TABLET | Freq: Once | ORAL | Status: DC

## 2014-10-21 ENCOUNTER — Ambulatory Visit: Payer: Medicaid Other

## 2014-10-21 ENCOUNTER — Other Ambulatory Visit: Payer: Self-pay | Admitting: *Deleted

## 2014-10-21 NOTE — Telephone Encounter (Signed)
Patient was given her test results earlier this morning and had a few questions. Patient had questions about symptoms and how long the infection may have been there. Patient advised that we are unable to tell her how long the infection has been there. Reviewed signs and symptoms. Patient asked if her partner would be able to be treated here. Patient advised that we would not be able to treat him here but he could be treated for free at the health department. Patient advised if left untreated it could cause PID and damage her reproductive organs. Patient verbalized understanding.

## 2014-10-24 ENCOUNTER — Emergency Department (HOSPITAL_COMMUNITY)
Admission: EM | Admit: 2014-10-24 | Discharge: 2014-10-25 | Disposition: A | Discharge status: Home / Self Care | Payer: Medicaid Other | Attending: Emergency Medicine | Admitting: Emergency Medicine

## 2014-10-24 ENCOUNTER — Encounter (HOSPITAL_COMMUNITY): Payer: Self-pay | Admitting: *Deleted

## 2014-10-24 DIAGNOSIS — Z720 Tobacco use: Secondary | ICD-10-CM | POA: Insufficient documentation

## 2014-10-24 DIAGNOSIS — Z791 Long term (current) use of non-steroidal anti-inflammatories (NSAID): Secondary | ICD-10-CM | POA: Diagnosis not present

## 2014-10-24 DIAGNOSIS — Z202 Contact with and (suspected) exposure to infections with a predominantly sexual mode of transmission: Secondary | ICD-10-CM | POA: Diagnosis present

## 2014-10-24 DIAGNOSIS — A549 Gonococcal infection, unspecified: Secondary | ICD-10-CM | POA: Insufficient documentation

## 2014-10-24 DIAGNOSIS — Z3202 Encounter for pregnancy test, result negative: Secondary | ICD-10-CM | POA: Diagnosis not present

## 2014-10-24 DIAGNOSIS — Z79899 Other long term (current) drug therapy: Secondary | ICD-10-CM | POA: Insufficient documentation

## 2014-10-24 LAB — POC URINE PREG, ED: Preg Test, Ur: NEGATIVE

## 2014-10-24 MED ORDER — CEFTRIAXONE SODIUM 250 MG IJ SOLR
250.0000 mg | Freq: Once | INTRAMUSCULAR | Status: AC
  Administered 2014-10-25: 250 mg via INTRAMUSCULAR
  Filled 2014-10-24: qty 250

## 2014-10-24 NOTE — ED Provider Notes (Signed)
CSN: [id number]     Arrival date & time 10/24/14  2025 History   First MD Initiated Contact with Patient 10/24/14 2142     Chief Complaint  Patient presents with  . SEXUALLY TRANSMITTED DISEASE     (Consider location/radiation/quality/duration/timing/severity/associated sxs/prior Treatment) HPI Comments: Patient presents today requesting treatment for Gonorrhea.  She states that she was seen at [facility] one week ago for abnormal vaginal bleeding.  At that time she had a negative pregnancy test and a pelvic exam.  She was also tested for Gonorrhea and Chlamydia and the Gonorrhea came back positive.  She was instructed to return to have a shot of Rocephin.  She states that she does not want another pelvic exam today.  She states that she does not want any further work up for abnormal vaginal bleeding.  She states, "I just want the shot."  She denies any vaginal bleeding at this time.  She states that the bleeding resolved four days ago.  She denies fever, chills, or urinary symptoms.  She does report mild intermittent pelvic pain, but also does not want work up for this.    The history is provided by the patient.    Past Medical History  Diagnosis Date  . Helicobacter pylori (H. pylori)    Past Surgical History  Procedure Laterality Date  . Uterine fibroid surgery    . C-section     Family History  Problem Relation Age of Onset  . HIV Father    History  Substance Use Topics  . Smoking status: Current Every Day Smoker -- 0.50 packs/day for 15 years    Types: Cigarettes  . Smokeless tobacco: Never Used  . Alcohol Use: No   OB History    Gravida Para Term Preterm AB TAB SAB Ectopic Multiple Living   1 1 1       1      Review of Systems  All other systems reviewed and are negative.     Allergies  Review of patient's allergies indicates no known allergies.  Home Medications   Prior to Admission medications   Medication Sig Start Date End Date Taking? Authorizing  Provider  Prenat-FeCbn-FeAspGl-FA-Omega (OB COMPLETE PETITE) 35-5-1-200 MG CAPS Take 1 capsule by mouth daily before breakfast. 10/18/14  Yes Brock Bad, MD  azithromycin (ZITHROMAX) 250 MG tablet Take 4 tablets (1,000 mg total) by mouth once. 10/19/14   Brock Bad, MD  cefTRIAXone (ROCEPHIN) 250 MG injection Inject 250 mg into the muscle once.  FOR IM use in LARGE MUSCLE MASS 10/19/14   Brock Bad, MD  clomiPHENE (CLOMID) 50 MG tablet Take 1 tablet daily on days 5 - 9 after the 1st day of period.  May repeat x 1. 09/14/14   Brock Bad, MD  naproxen sodium (ANAPROX DS) 550 MG tablet Take 1 tablet (550 mg total) by mouth 2 (two) times daily with a meal. 10/17/14   Brock Bad, MD  valACYclovir (VALTREX) 500 MG tablet take 1 tablet by mouth twice a day Patient not taking: Reported on 09/14/2014 07/26/14   Brock Bad, MD   BP 147/74 mmHg  Pulse 70  Temp(Src) 98.5 F (36.9 C) (Oral)  Resp 18  Ht 5\' 4"  (1.626 m)  Wt 232 lb (105.235 kg)  BMI 39.80 kg/m2  SpO2 98%  LMP 09/18/2014 Physical Exam  Constitutional: She appears well-developed and well-nourished.  HENT:  Head: Normocephalic and atraumatic.  Mouth/Throat: Oropharynx is clear and moist.  Neck: Normal range of motion. Neck supple.  Cardiovascular: Normal rate, regular rhythm and normal heart sounds.   Pulmonary/Chest: Effort normal and breath sounds normal.  Abdominal: Soft. Bowel sounds are normal. She exhibits no distension and no mass. There is no tenderness. There is no rebound and no guarding.  Genitourinary:  Patient declined pelvic exam  Nursing note and vitals reviewed.   ED Course  Procedures (including critical care time) Labs Review Labs Reviewed  POC URINE PREG, ED    Imaging Review No results found.   EKG Interpretation None      MDM   Final diagnoses:  None   Patient presents today requesting IM shot for Gonorrhea.  She was recently seen at [facility] one week ago  and had a GC/Chlamydia that came back positive for Gonorrhea.  She does not want any additional work up.  Urine pregnancy is negative.  Patient given Rocephin.  Stable for discharge.  Return precautions given.    Santiago Glad, PA-C 10/25/14 0035  Melene Plan, DO 10/25/14 2303

## 2014-10-24 NOTE — ED Notes (Signed)
The patient states that she was diagnosed with gonerrhea at [facility]; -pt states that she was supposed to go there and ger a shot but came here to get it instead; pt states that she has a prescription for some medicines

## 2014-10-24 NOTE — ED Provider Notes (Signed)
  HPI Comments: Ellen Booker is a 34 y.o. female who presents to the Emergency Department complaining of exposure to STD. Pt notes that she went to [facility] for a routine visit and she was dx with gonorrhea at that time. Pt was supposed to get treatment for her gonorrhea which she didn't get and she came to the ED instead. Pt also notes that she had a positive pregnancy test and that is what led her to go to the clinic because 1 month later she had a negative test. Pt also went to the clinic because of her AUB, she reports that she has been bleeding nonstop for 2 weeks and that her last heavy day was 5 days ago. Pt was informed to f/u with the clinic if her bleeding didn't resolve.   I personally performed the services described in this documentation, which was scribed in my presence. The recorded information has been reviewed and is accurate.   Earley Favor, NP 11/03/14 2015  Alvira Monday, MD 11/17/14 [id number]

## 2014-10-25 MED ORDER — STERILE WATER FOR INJECTION IJ SOLN
INTRAMUSCULAR | Status: AC
  Filled 2014-10-25: qty 10

## 2014-11-30 ENCOUNTER — Encounter: Payer: Self-pay | Admitting: Obstetrics

## 2014-11-30 ENCOUNTER — Ambulatory Visit (INDEPENDENT_AMBULATORY_CARE_PROVIDER_SITE_OTHER): Payer: Medicaid Other | Admitting: Obstetrics

## 2014-11-30 ENCOUNTER — Ambulatory Visit: Payer: Self-pay | Admitting: Obstetrics

## 2014-11-30 VITALS — BP 129/84 | HR 73 | Temp 98.4°F | Ht 64.0 in | Wt 237.0 lb

## 2014-11-30 DIAGNOSIS — K589 Irritable bowel syndrome without diarrhea: Secondary | ICD-10-CM

## 2014-11-30 DIAGNOSIS — R197 Diarrhea, unspecified: Secondary | ICD-10-CM | POA: Diagnosis not present

## 2014-11-30 DIAGNOSIS — B373 Candidiasis of vulva and vagina: Secondary | ICD-10-CM | POA: Diagnosis not present

## 2014-11-30 DIAGNOSIS — Z113 Encounter for screening for infections with a predominantly sexual mode of transmission: Secondary | ICD-10-CM | POA: Diagnosis not present

## 2014-11-30 DIAGNOSIS — R1030 Lower abdominal pain, unspecified: Secondary | ICD-10-CM

## 2014-11-30 DIAGNOSIS — B3731 Acute candidiasis of vulva and vagina: Secondary | ICD-10-CM

## 2014-11-30 DIAGNOSIS — N760 Acute vaginitis: Secondary | ICD-10-CM | POA: Diagnosis not present

## 2014-11-30 DIAGNOSIS — A499 Bacterial infection, unspecified: Secondary | ICD-10-CM | POA: Diagnosis not present

## 2014-11-30 DIAGNOSIS — B9689 Other specified bacterial agents as the cause of diseases classified elsewhere: Secondary | ICD-10-CM

## 2014-11-30 DIAGNOSIS — K5909 Other constipation: Secondary | ICD-10-CM | POA: Diagnosis not present

## 2014-11-30 DIAGNOSIS — K5904 Chronic idiopathic constipation: Secondary | ICD-10-CM

## 2014-11-30 MED ORDER — FLUCONAZOLE 150 MG PO TABS: 150.0000 mg | ORAL_TABLET | Freq: Once | ORAL | Status: DC

## 2014-11-30 MED ORDER — METRONIDAZOLE 500 MG PO TABS
500.0000 mg | ORAL_TABLET | Freq: Two times a day (BID) | ORAL | Status: DC
Start: 2014-11-30 — End: 2015-03-31

## 2014-11-30 MED ORDER — DICYCLOMINE HCL 20 MG PO TABS: 20.0000 mg | ORAL_TABLET | Freq: Four times a day (QID) | ORAL | Status: DC

## 2014-11-30 NOTE — Patient Instructions (Signed)
Bacterial Vaginosis Bacterial vaginosis is a vaginal infection that occurs when the normal balance of bacteria in the vagina is disrupted. It results from an overgrowth of certain bacteria. This is the most common vaginal infection in women of childbearing age. Treatment is important to prevent complications, especially in pregnant women, as it can cause a premature delivery. CAUSES  Bacterial vaginosis is caused by an increase in harmful bacteria that are normally present in smaller amounts in the vagina. Several different kinds of bacteria can cause bacterial vaginosis. However, the reason that the condition develops is not fully understood. RISK FACTORS Certain activities or behaviors can put you at an increased risk of developing bacterial vaginosis, including:  Having a new sex partner or multiple sex partners.  Douching.  Using an intrauterine device (IUD) for contraception. Women do not get bacterial vaginosis from toilet seats, bedding, swimming pools, or contact with objects around them. SIGNS AND SYMPTOMS  Some women with bacterial vaginosis have no signs or symptoms. Common symptoms include:  Grey vaginal discharge.  A fishlike odor with discharge, especially after sexual intercourse.  Itching or burning of the vagina and vulva.  Burning or pain with urination. DIAGNOSIS  Your health care provider will take a medical history and examine the vagina for signs of bacterial vaginosis. A sample of vaginal fluid may be taken. Your health care provider will look at this sample under a microscope to check for bacteria and abnormal cells. A vaginal pH test may also be done.  TREATMENT  Bacterial vaginosis may be treated with antibiotic medicines. These may be given in the form of a pill or a vaginal cream. A second round of antibiotics may be prescribed if the condition comes back after treatment.  HOME CARE INSTRUCTIONS   Only take over-the-counter or prescription medicines as  directed by your health care provider.  If antibiotic medicine was prescribed, take it as directed. Make sure you finish it even if you start to feel better.  Do not have sex until treatment is completed.  Tell all sexual partners that you have a vaginal infection. They should see their health care provider and be treated if they have problems, such as a mild rash or itching.  Practice safe sex by using condoms and only having one sex partner. SEEK MEDICAL CARE IF:   Your symptoms are not improving after 3 days of treatment.  You have increased discharge or pain.  You have a fever. MAKE SURE YOU:   Understand these instructions.  Will watch your condition.  Will get help right away if you are not doing well or get worse. FOR MORE INFORMATION  Centers for Disease Control and Prevention, Division of STD Prevention: SolutionApps.co.za American Sexual Health Association (ASHA): www.ashastd.org  Document Released: 03/11/2005 Document Revised: 12/30/2012 Document Reviewed: 10/21/2012 [facility] Patient Information 2015 [place], [place]. This information is not intended to replace advice given to you by your health care provider. Make sure you discuss any questions you have with your health care provider. Irritable Bowel Syndrome Irritable bowel syndrome (IBS) is caused by a disturbance of normal bowel function and is a common digestive disorder. You may also hear this condition called spastic colon, mucous colitis, and irritable colon. There is no cure for IBS. However, symptoms often gradually improve or disappear with a good diet, stress management, and medicine. This condition usually appears in late adolescence or early adulthood. Women develop it twice as often as men. CAUSES  After food has been digested and absorbed in  the small intestine, waste material is moved into the large intestine, or colon. In the colon, water and salts are absorbed from the undigested products coming from the small  intestine. The remaining residue, or fecal material, is held for elimination. Under normal circumstances, gentle, rhythmic contractions of the bowel walls push the fecal material along the colon toward the rectum. In IBS, however, these contractions are irregular and poorly coordinated. The fecal material is either retained too long, resulting in constipation, or expelled too soon, producing diarrhea. SIGNS AND SYMPTOMS  The most common symptom of IBS is abdominal pain. It is often in the lower left side of the abdomen, but it may occur anywhere in the abdomen. The pain comes from spasms of the bowel muscles happening too much and from the buildup of gas and fecal material in the colon. This pain:  Can range from sharp abdominal cramps to a dull, continuous ache.  Often worsens soon after eating.  Is often relieved by having a bowel movement or passing gas. Abdominal pain is usually accompanied by constipation, but it may also produce diarrhea. The diarrhea often occurs right after a meal or upon waking up in the morning. The stools are often soft, watery, and flecked with mucus. Other symptoms of IBS include:  Bloating.  Loss of appetite.  Heartburn.  Backache.  Dull pain in the arms or shoulders.  Nausea.  Burping.  Vomiting.  Gas. IBS may also cause symptoms that are unrelated to the digestive system, such as:  Fatigue.  Headaches.  Anxiety.  Shortness of breath.  Trouble concentrating.  Dizziness. These symptoms tend to come and go. DIAGNOSIS  The symptoms of IBS may seem like symptoms of other, more serious digestive disorders. Your health care provider may want to perform tests to exclude these disorders.  TREATMENT Many medicines are available to help correct bowel function or relieve bowel spasms and abdominal pain. Among the medicines available are:  Laxatives for severe constipation and to help restore normal bowel habits.  Specific antidiarrheal medicines  to treat severe or lasting diarrhea.  Antispasmodic agents to relieve intestinal cramps. Your health care provider may also decide to treat you with a mild tranquilizer or sedative during unusually stressful periods in your life. Your health care provider may also prescribe antidepressant medicine. The use of this medicine has been shown to reduce pain and other symptoms of IBS. Remember that if any medicine is prescribed for you, you should take it exactly as directed. Make sure your health care provider knows how well it worked for you. HOME CARE INSTRUCTIONS   Take all medicines as directed by your health care provider.  Avoid foods that are high in fat or oils, such as heavy cream, butter, frankfurters, sausage, and other fatty meats.  Avoid foods that make you go to the bathroom, such as fruit, fruit juice, and dairy products.  Cut out carbonated drinks, chewing gum, and "gassy" foods such as beans and cabbage. This may help relieve bloating and burping.  Eat foods with bran, and drink plenty of liquids with the bran foods. This helps relieve constipation.  Keep track of what foods seem to bring on your symptoms.  Avoid emotionally charged situations or circumstances that produce anxiety.  Start or continue exercising.  Get plenty of rest and sleep. Document Released: 03/11/2005 Document Revised: 03/16/2013 Document Reviewed: 10/30/2007 [facility] Patient Information 2015 [place], [place]. This information is not intended to replace advice given to you by your health care provider. Make  sure you discuss any questions you have with your health care provider.  

## 2014-11-30 NOTE — Progress Notes (Signed)
Patient ID: Ellen Booker, female   DOB: Aug 16, 1980, 34 y.o.   MRN: [id number]  Chief Complaint  Patient presents with  . TOC    HPI Ellen Booker is a 34 y.o. female.  H/O positive GC cervicitis, treated.  Presents for [facility] cultures today.  H/O chronic diarrhea, constipation and lower abdominal pain. HPI  Past Medical History  Diagnosis Date  . Helicobacter pylori (H. pylori)     Past Surgical History  Procedure Laterality Date  . Uterine fibroid surgery    . C-section      Family History  Problem Relation Age of Onset  . HIV Father     Social History Social History  Substance Use Topics  . Smoking status: Current Every Day Smoker -- 0.50 packs/day for 15 years    Types: Cigarettes  . Smokeless tobacco: Never Used  . Alcohol Use: No    No Known Allergies  Current Outpatient Prescriptions  Medication Sig Dispense Refill  . dicyclomine (BENTYL) 20 MG tablet Take 1 tablet (20 mg total) by mouth every 6 (six) hours. 120 tablet 5  . fluconazole (DIFLUCAN) 150 MG tablet Take 1 tablet (150 mg total) by mouth once. 1 tablet 2  . metroNIDAZOLE (FLAGYL) 500 MG tablet Take 1 tablet (500 mg total) by mouth 2 (two) times daily. 14 tablet 2  . Prenat-FeCbn-FeAspGl-FA-Omega (OB COMPLETE PETITE) 35-5-1-200 MG CAPS Take 1 capsule by mouth daily before breakfast. (Patient not taking: Reported on 11/30/2014) 30 capsule 11  . valACYclovir (VALTREX) 500 MG tablet take 1 tablet by mouth twice a day (Patient not taking: Reported on 09/14/2014) 60 tablet 11   No current facility-administered medications for this visit.    Review of Systems Review of Systems Constitutional: negative for fatigue and weight loss Respiratory: negative for cough and wheezing Cardiovascular: negative for chest pain, fatigue and palpitations Gastrointestinal: positive for abdominal pain and change in bowel habits Genitourinary: positive GC, treated Integument/breast: negative for nipple  discharge Musculoskeletal:negative for myalgias Neurological: negative for gait problems and tremors Behavioral/Psych: negative for abusive relationship, depression Endocrine: negative for temperature intolerance     Blood pressure 129/84, pulse 73, temperature 98.4 F (36.9 C), height 5\' 4"  (1.626 m), weight 237 lb (107.502 kg), last menstrual period 11/18/2014.  Physical Exam Physical Exam:           General:  Alert and no distress Abdomen:  normal findings: no organomegaly, soft, non-tender and no hernia  Pelvis:  External genitalia: normal general appearance Urinary system: urethral meatus normal and bladder without fullness, nontender Vaginal: normal without tenderness, induration or masses Cervix: normal appearance Adnexa: normal bimanual exam Uterus: anteverted and non-tender, normal size      Data Reviewed Labs Ultrasound  Assessment     Positive GC cervicitis, treated.      Plan    TOC cultures done today. Bentyl Rx Referred to Gastroenterologist for GI complaints, R/O IBS-diarrhea / constipation F/U for routine annual and pap  Orders Placed This Encounter  Procedures  . SureSwab, Vaginosis/Vaginitis Plus  . Ambulatory referral to Gastroenterology    Referral Priority:  Routine    Referral Type:  Consultation    Referral Reason:  Specialty Services Required    Number of Visits Requested:  1   Meds ordered this encounter  Medications  . dicyclomine (BENTYL) 20 MG tablet    Sig: Take 1 tablet (20 mg total) by mouth every 6 (six) hours.    Dispense:  120 tablet    Refill:  5  . metroNIDAZOLE (FLAGYL) 500 MG tablet    Sig: Take 1 tablet (500 mg total) by mouth 2 (two) times daily.    Dispense:  14 tablet    Refill:  2  . fluconazole (DIFLUCAN) 150 MG tablet    Sig: Take 1 tablet (150 mg total) by mouth once.    Dispense:  1 tablet    Refill:  2

## 2014-12-02 ENCOUNTER — Telehealth: Payer: Self-pay | Admitting: *Deleted

## 2014-12-02 NOTE — Telephone Encounter (Signed)
Pt called to office for test results.   Return call to pt.  Pt made aware that SureSwab has not yet resulted.  Pt was advised to contact office on Monday for results.

## 2014-12-03 LAB — SURESWAB, VAGINOSIS/VAGINITIS PLUS
Atopobium vaginae: 6.2 Log (cells/mL)
BV CATEGORY: UNDETERMINED — AB
C. GLABRATA, DNA: NOT DETECTED
C. TROPICALIS, DNA: NOT DETECTED
C. albicans, DNA: DETECTED — AB
C. parapsilosis, DNA: NOT DETECTED
C. trachomatis RNA, TMA: NOT DETECTED
LACTOBACILLUS SPECIES: 5.9 Log (cells/mL)
MEGASPHAERA SPECIES: NOT DETECTED Log (cells/mL)
N. GONORRHOEAE RNA, TMA: NOT DETECTED
T. vaginalis RNA, QL TMA: NOT DETECTED

## 2014-12-05 ENCOUNTER — Telehealth: Payer: Self-pay | Admitting: *Deleted

## 2014-12-05 NOTE — Telephone Encounter (Signed)
Patient is calling for her results. 2:07 Call to patient- let her know the TOC is negative. She is presently taking Metronidazole and will follow that with diflucan for yeast. She states she has extreme pain with intercourse and wants to know what the next step is because something has to be causing this pain she has been having since July. Told patient I would let Dr Clearance Coots know and have him address her issue.

## 2014-12-06 ENCOUNTER — Telehealth: Payer: Self-pay | Admitting: *Deleted

## 2014-12-06 NOTE — Telephone Encounter (Signed)
Patient is requesting an [place] to assess her pain.

## 2014-12-07 ENCOUNTER — Other Ambulatory Visit: Payer: Self-pay | Admitting: Obstetrics

## 2014-12-08 ENCOUNTER — Other Ambulatory Visit: Payer: Self-pay | Admitting: Obstetrics

## 2014-12-08 DIAGNOSIS — E669 Obesity, unspecified: Secondary | ICD-10-CM

## 2014-12-08 MED ORDER — PHENTERMINE HCL 37.5 MG PO CAPS: 37.5000 mg | ORAL_CAPSULE | ORAL | Status: DC

## 2015-01-02 ENCOUNTER — Encounter: Payer: Self-pay | Admitting: *Deleted

## 2015-01-09 ENCOUNTER — Ambulatory Visit: Payer: Medicaid Other | Admitting: Obstetrics

## 2015-01-27 ENCOUNTER — Encounter: Payer: Self-pay | Admitting: Obstetrics

## 2015-01-27 ENCOUNTER — Ambulatory Visit (INDEPENDENT_AMBULATORY_CARE_PROVIDER_SITE_OTHER): Payer: Medicaid Other | Admitting: Obstetrics

## 2015-01-27 VITALS — BP 137/83 | HR 82 | Wt 227.0 lb

## 2015-01-27 DIAGNOSIS — E669 Obesity, unspecified: Secondary | ICD-10-CM

## 2015-01-27 DIAGNOSIS — E282 Polycystic ovarian syndrome: Secondary | ICD-10-CM

## 2015-01-27 DIAGNOSIS — K589 Irritable bowel syndrome without diarrhea: Secondary | ICD-10-CM | POA: Diagnosis not present

## 2015-01-27 DIAGNOSIS — N839 Noninflammatory disorder of ovary, fallopian tube and broad ligament, unspecified: Secondary | ICD-10-CM

## 2015-01-27 DIAGNOSIS — L680 Hirsutism: Secondary | ICD-10-CM | POA: Diagnosis not present

## 2015-01-27 DIAGNOSIS — N946 Dysmenorrhea, unspecified: Secondary | ICD-10-CM | POA: Diagnosis not present

## 2015-01-27 LAB — PROGESTERONE: Progesterone: 13.2 ng/mL

## 2015-01-27 LAB — HEMOGLOBIN A1C
HEMOGLOBIN A1C: 5.7 % — AB (ref <5.7)
MEAN PLASMA GLUCOSE: 117 mg/dL — AB (ref <117)

## 2015-01-27 MED ORDER — METFORMIN HCL ER 500 MG PO TB24: 500.0000 mg | ORAL_TABLET | Freq: Every day | ORAL | Status: DC

## 2015-01-27 NOTE — Progress Notes (Signed)
Patient ID: Ellen Booker, female   DOB: 03/29/1980, 34 y.o.   MRN: [id number]  Chief Complaint  Patient presents with  . Follow-up    pt states that she is here for 21 day after cycle follow up    HPI Ellen Booker is a 34 y.o. female.  Presents for 21 day progesterone level to be drawn today.  Lower abdominal pain resolved with Bentyl.  Has been taking Phentermine for weight loss and has good results over the past month.  Still has concern about excess facial hair growth and acne, as that relates to PCOS, excess androgen production, obesity and development of Diabetes. HPI  Past Medical History  Diagnosis Date  . Helicobacter pylori (H. pylori)     Past Surgical History  Procedure Laterality Date  . Uterine fibroid surgery    . C-section      Family History  Problem Relation Age of Onset  . HIV Father     Social History Social History  Substance Use Topics  . Smoking status: Current Every Day Smoker -- 0.50 packs/day for 15 years    Types: Cigarettes  . Smokeless tobacco: Never Used  . Alcohol Use: No    No Known Allergies  Current Outpatient Prescriptions  Medication Sig Dispense Refill  . phentermine 37.5 MG capsule Take 1 capsule (37.5 mg total) by mouth every morning. 30 capsule 2  . dicyclomine (BENTYL) 20 MG tablet Take 1 tablet (20 mg total) by mouth every 6 (six) hours. (Patient not taking: Reported on 01/27/2015) 120 tablet 5  . metFORMIN (GLUCOPHAGE XR) 500 MG 24 hr tablet Take 1 tablet (500 mg total) by mouth daily with breakfast. 120 tablet 11  . metroNIDAZOLE (FLAGYL) 500 MG tablet Take 1 tablet (500 mg total) by mouth 2 (two) times daily. (Patient not taking: Reported on 01/27/2015) 14 tablet 2  . Prenat-FeCbn-FeAspGl-FA-Omega (OB COMPLETE PETITE) 35-5-1-200 MG CAPS Take 1 capsule by mouth daily before breakfast. (Patient not taking: Reported on 11/30/2014) 30 capsule 11  . valACYclovir (VALTREX) 500 MG tablet take 1 tablet by mouth twice a day (Patient not  taking: Reported on 09/14/2014) 60 tablet 11   No current facility-administered medications for this visit.    Review of Systems Review of Systems Constitutional: negative for fatigue and weight loss Respiratory: negative for cough and wheezing Cardiovascular: negative for chest pain, fatigue and palpitations Gastrointestinal: negative for abdominal pain and change in bowel habits Genitourinary:negative Integument/breast: positive for excess facial hair and acne.   Musculoskeletal:negative for myalgias Neurological: negative for gait problems and tremors Behavioral/Psych: negative for abusive relationship, depression Endocrine: negative for temperature intolerance     Blood pressure 137/83, pulse 82, weight 227 lb (102.967 kg), last menstrual period 01/06/2015.  Physical Exam Physical Exam : Deferred  100% of 10 min visit spent on counseling and coordination of care.   Data Reviewed Labs Ultrasound  Assessment     Ovulation Dysfunction.  Probably related to PCOS. Pelvic pain.  Probably IBS related. Obesity Hirsitism     Plan    Day 21 Progesterone HgBA1c F/U in 3 months   Orders Placed This Encounter  Procedures  . Progesterone  . HgB A1c   Meds ordered this encounter  Medications  . metFORMIN (GLUCOPHAGE XR) 500 MG 24 hr tablet    Sig: Take 1 tablet (500 mg total) by mouth daily with breakfast.    Dispense:  120 tablet    Refill:  11

## 2015-02-03 ENCOUNTER — Telehealth: Payer: Self-pay | Admitting: *Deleted

## 2015-02-03 NOTE — Telephone Encounter (Signed)
Patient contacted the office in regards to lab results and a prescription for Clomid.  Attempted to contact the patient and left message for patient to call the office.

## 2015-02-03 NOTE — Telephone Encounter (Signed)
Patient contacted the office requesting lab results and gave permission to leave a voicemail with lab results. Attempted to contact the patient and left message with lab results on voicemail. Advised patient to contact the office if she had any questions regarding her HgB A1C or her progesterone.

## 2015-02-07 ENCOUNTER — Ambulatory Visit: Payer: Medicaid Other | Admitting: Obstetrics

## 2015-02-10 NOTE — Telephone Encounter (Signed)
Contacted patient and left a message advising patient that Dr. Clearance CootsHarper sent in a prescription for the Metformin at the beginning of November and if for any reason she was having problems picking up the prescription to contact the office. As far as the Clomid goes she is going to need to make and appointment to discuss that with Dr. Clearance CootsHarper further based off her recent lab results.

## 2015-03-28 ENCOUNTER — Encounter: Payer: Self-pay | Admitting: Obstetrics

## 2015-03-28 ENCOUNTER — Ambulatory Visit (INDEPENDENT_AMBULATORY_CARE_PROVIDER_SITE_OTHER): Payer: Medicaid Other | Admitting: Obstetrics

## 2015-03-28 VITALS — BP 131/83 | HR 81 | Ht 64.0 in | Wt 234.0 lb

## 2015-03-28 DIAGNOSIS — Z01419 Encounter for gynecological examination (general) (routine) without abnormal findings: Secondary | ICD-10-CM

## 2015-03-28 DIAGNOSIS — Z0000 Encounter for general adult medical examination without abnormal findings: Secondary | ICD-10-CM | POA: Diagnosis not present

## 2015-03-28 DIAGNOSIS — F418 Other specified anxiety disorders: Secondary | ICD-10-CM

## 2015-03-28 DIAGNOSIS — Z3169 Encounter for other general counseling and advice on procreation: Secondary | ICD-10-CM

## 2015-03-28 DIAGNOSIS — K589 Irritable bowel syndrome without diarrhea: Secondary | ICD-10-CM

## 2015-03-28 DIAGNOSIS — N839 Noninflammatory disorder of ovary, fallopian tube and broad ligament, unspecified: Secondary | ICD-10-CM

## 2015-03-28 NOTE — Progress Notes (Signed)
Subjective:        Ellen Booker is a 35 y.o. female here for a routine exam.  Current complaints: chronic constipation, lack of energy and feels tired and gets very little done.  Still trying to conceive, without success.  Tried 2 rounds of Clomid.  Personal health questionnaire:  Is patient [name], have a family history of breast and/or ovarian cancer: no Is there a family history of uterine cancer diagnosed at age < 7550, gastrointestinal cancer, urinary tract cancer, family member who is a Personnel officerLynch syndrome-associated carrier: no Is the patient overweight and hypertensive, family history of diabetes, personal history of gestational diabetes, preeclampsia or PCOS: no Is patient over 3555, have PCOS,  family history of premature CHD under age 34, diabetes, smoke, have hypertension or peripheral artery disease:  no At any time, has a partner hit, kicked or otherwise hurt or frightened you?: no Over the past 2 weeks, have you felt down, depressed or hopeless?: no Over the past 2 weeks, have you felt little interest or pleasure in doing things?:yes   Gynecologic History Patient's last menstrual period was 03/24/2015. Contraception: none Last Pap: 2015. Results were: normal Last mammogram: n/a. Results were: n/a  Obstetric History OB History  Gravida Para Term Preterm AB SAB TAB Ectopic Multiple Living  1 1 1       1     # Outcome Date GA Lbr Len/2nd Weight Sex Delivery Anes PTL Lv  1 Term  [id number]    CS-LTranv   Y      Past Medical History  Diagnosis Date  . Helicobacter pylori (H. pylori)     Past Surgical History  Procedure Laterality Date  . Uterine fibroid surgery    . C-section       Current outpatient prescriptions:  .  dicyclomine (BENTYL) 20 MG tablet, Take 1 tablet (20 mg total) by mouth every 6 (six) hours., Disp: 120 tablet, Rfl: 5 .  metFORMIN (GLUCOPHAGE XR) 500 MG 24 hr tablet, Take 1 tablet (500 mg total) by mouth daily with breakfast., Disp: 120  tablet, Rfl: 11 .  metroNIDAZOLE (FLAGYL) 500 MG tablet, Take 1 tablet (500 mg total) by mouth 2 (two) times daily. (Patient not taking: Reported on 01/27/2015), Disp: 14 tablet, Rfl: 2 .  phentermine 37.5 MG capsule, Take 1 capsule (37.5 mg total) by mouth every morning. (Patient not taking: Reported on 03/28/2015), Disp: 30 capsule, Rfl: 2 .  Prenat-FeCbn-FeAspGl-FA-Omega (OB COMPLETE PETITE) 35-5-1-200 MG CAPS, Take 1 capsule by mouth daily before breakfast. (Patient not taking: Reported on 11/30/2014), Disp: 30 capsule, Rfl: 11 .  valACYclovir (VALTREX) 500 MG tablet, take 1 tablet by mouth twice a day (Patient not taking: Reported on 09/14/2014), Disp: 60 tablet, Rfl: 11 No Known Allergies  Social History  Substance Use Topics  . Smoking status: Current Every Day Smoker -- 0.50 packs/day for 15 years    Types: Cigarettes  . Smokeless tobacco: Never Used  . Alcohol Use: No    Family History  Problem Relation Age of Onset  . HIV Father       Review of Systems  Constitutional: negative for fatigue and weight loss Respiratory: negative for cough and wheezing Cardiovascular: negative for chest pain, fatigue and palpitations Gastrointestinal: negative for abdominal pain and change in bowel habits Musculoskeletal:negative for myalgias Neurological: negative for gait problems and tremors Behavioral/Psych: negative for abusive relationship, depression Endocrine: negative for temperature intolerance   Genitourinary:negative for abnormal menstrual periods, genital lesions, hot flashes, sexual  problems and vaginal discharge Integument/breast: negative for breast lump, breast tenderness, nipple discharge and skin lesion(s)    Objective:       BP 131/83 mmHg  Pulse 81  Ht 5\' 4"  (1.626 m)  Wt 234 lb (106.142 kg)  BMI 40.15 kg/m2  LMP 03/24/2015 General:   alert  Skin:   no rash or abnormalities  Lungs:   clear to auscultation bilaterally  Heart:   regular rate and rhythm, S1, S2 normal,  no murmur, click, rub or gallop  Breasts:   normal without suspicious masses, skin or nipple changes or axillary nodes  Abdomen:  normal findings: no organomegaly, soft, non-tender and no hernia  Pelvis:  External genitalia: normal general appearance Urinary system: urethral meatus normal and bladder without fullness, nontender Vaginal: normal without tenderness, induration or masses Cervix: normal appearance Adnexa: normal bimanual exam Uterus: anteverted and non-tender, normal size   Lab Review Urine pregnancy test Labs reviewed yes Radiologic studies reviewed yes   Assessment:    Healthy female exam.    Plan:    Education reviewed: calcium supplements, depression evaluation, low fat, low cholesterol diet, self breast exams and weight bearing exercise. Contraception: none. Follow up in: 1 year.   No orders of the defined types were placed in this encounter.   Orders Placed This Encounter  Procedures  . SureSwab Bacterial Vaginosis/itis  . Ambulatory referral to Gastroenterology    Referral Priority:  Routine    Referral Type:  Consultation    Referral Reason:  Specialty Services Required    Number of Visits Requested:  1  . Ambulatory referral to Psychiatry    Referral Priority:  Routine    Referral Type:  Psychiatric    Referral Reason:  Specialty Services Required    Requested Specialty:  Psychiatry    Number of Visits Requested:  1

## 2015-03-29 ENCOUNTER — Telehealth: Payer: Self-pay

## 2015-03-29 ENCOUNTER — Telehealth: Payer: Self-pay | Admitting: Gastroenterology

## 2015-03-29 NOTE — Telephone Encounter (Signed)
[facility] 2/23 8:30AM - LET PATIENT KNOW

## 2015-03-30 LAB — PAP, TP IMAGING W/ HPV RNA, RFLX HPV TYPE 16,18/45: HPV mRNA, High Risk: NOT DETECTED

## 2015-03-30 LAB — SURESWAB BACTERIAL VAGINOSIS/ITIS
Atopobium vaginae: NOT DETECTED Log (cells/mL)
BV CATEGORY: UNDETERMINED — AB
C. ALBICANS, DNA: NOT DETECTED
C. GLABRATA, DNA: NOT DETECTED
C. parapsilosis, DNA: NOT DETECTED
C. tropicalis, DNA: NOT DETECTED
Gardnerella vaginalis: 7.8 Log (cells/mL)
LACTOBACILLUS SPECIES: 5.3 Log (cells/mL)
MEGASPHAERA SPECIES: NOT DETECTED Log (cells/mL)
T. vaginalis RNA, QL TMA: NOT DETECTED

## 2015-03-31 ENCOUNTER — Other Ambulatory Visit: Payer: Self-pay | Admitting: Obstetrics

## 2015-03-31 DIAGNOSIS — B9689 Other specified bacterial agents as the cause of diseases classified elsewhere: Secondary | ICD-10-CM

## 2015-03-31 DIAGNOSIS — N760 Acute vaginitis: Principal | ICD-10-CM

## 2015-03-31 MED ORDER — METRONIDAZOLE 500 MG PO TABS: 500.0000 mg | ORAL_TABLET | Freq: Two times a day (BID) | ORAL | Status: DC

## 2015-04-10 ENCOUNTER — Other Ambulatory Visit: Payer: Self-pay | Admitting: *Deleted

## 2015-04-10 DIAGNOSIS — B379 Candidiasis, unspecified: Secondary | ICD-10-CM

## 2015-04-10 MED ORDER — FLUCONAZOLE 150 MG PO TABS: 150.0000 mg | ORAL_TABLET | Freq: Once | ORAL | Status: DC

## 2015-04-10 NOTE — Progress Notes (Signed)
Patient has recently been on antibiotics for bv and is now having symptoms of a yeast infection. Per Dr. Clearance CootsHarper okay to send Diflucan to the pharmacy. Patient aware.

## 2015-04-27 ENCOUNTER — Ambulatory Visit: Payer: Medicaid Other | Admitting: Obstetrics

## 2015-05-18 ENCOUNTER — Encounter: Payer: Self-pay | Admitting: Gastroenterology

## 2015-05-18 ENCOUNTER — Ambulatory Visit (INDEPENDENT_AMBULATORY_CARE_PROVIDER_SITE_OTHER): Payer: Medicaid Other | Admitting: Gastroenterology

## 2015-05-18 VITALS — BP 138/78 | HR 70 | Ht 64.0 in | Wt 235.0 lb

## 2015-05-18 DIAGNOSIS — R1032 Left lower quadrant pain: Secondary | ICD-10-CM

## 2015-05-18 DIAGNOSIS — R140 Abdominal distension (gaseous): Secondary | ICD-10-CM | POA: Diagnosis not present

## 2015-05-18 DIAGNOSIS — K5901 Slow transit constipation: Secondary | ICD-10-CM | POA: Diagnosis not present

## 2015-05-18 NOTE — Progress Notes (Signed)
Gastroenterology and Hepatology Consult Note:  History: Ellen Booker 05/18/2015  Referring physician: Brock Bad, MD  Reason for consult/chief complaint: Abdominal Pain and Change in bowel habits   Subjective HPI:  This is a 35 year old woman for for about a year of chronic left lower quadrant pain and constipation. The pain is frequent throughout the day, it is certainly worse after meals and before BM. Somewhat relieved after BM, but she tends toward constipation. Over the last year the stools have become less frequent with a need to strain. She denies rectal bleeding. Canasa and her husband have been trying to conceive for at least the last year, she has 2 unsuccessful rounds of Clomid this year. She also complains of chronic abdominal bloating persists even after 30 pounds a purposeful weight loss last year. She denies dysphagia odynophagia and nausea vomiting or early satiety.   ROS:  Review of Systems  Constitutional: Negative for appetite change and unexpected weight change.  HENT: Negative for mouth sores and voice change.   Eyes: Negative for pain and redness.  Respiratory: Negative for cough and shortness of breath.   Cardiovascular: Negative for chest pain and palpitations.  Genitourinary: Negative for dysuria and hematuria.  Musculoskeletal: Negative for myalgias and arthralgias.  Skin: Negative for pallor and rash.  Neurological: Negative for weakness and headaches.  Hematological: Negative for adenopathy.     Past Medical History: Past Medical History  Diagnosis Date  . Helicobacter pylori (H. pylori)      Past Surgical History: Past Surgical History  Procedure Laterality Date  . Uterine fibroid surgery    . C-section       Family History: Family History  Problem Relation Age of Onset  . HIV Father   . Uterine cancer      Social History: Social History   Social History  . Marital Status: Single    Spouse Name: N/A  . Number of  Children: N/A  . Years of Education: N/A   Social History Main Topics  . Smoking status: Current Every Day Smoker -- 0.50 packs/day for 15 years    Types: Cigarettes  . Smokeless tobacco: Never Used  . Alcohol Use: No  . Drug Use: No  . Sexual Activity:    Partners: Male    Birth Control/ Protection: None   Other Topics Concern  . None   Social History Narrative    Allergies: No Known Allergies  Outpatient Meds: Current Outpatient Prescriptions  Medication Sig Dispense Refill  . dicyclomine (BENTYL) 20 MG tablet Take 1 tablet (20 mg total) by mouth every 6 (six) hours. 120 tablet 5  . metFORMIN (GLUCOPHAGE XR) 500 MG 24 hr tablet Take 1 tablet (500 mg total) by mouth daily with breakfast. 120 tablet 11   No current facility-administered medications for this visit.      ___________________________________________________________________ Objective  Exam: Husband present for the entire encounter  BP 138/78 mmHg  Pulse 70  Ht  (1.626 m)  Wt 235 lb (106.595 kg)  BMI 40.32 kg/m2  LMP 05/16/2015  General: this is a obese African-American female patient not acutely ill  Eyes: sclera anicteric, no redness  ENT: oral mucosa moist without lesions, no cervical or supraclavicular lymphadenopathy, good dentition  CV: RRR without murmur, S1/S2, no JVD,, no  peripheral edema  Resp: clear to auscultation bilaterally, normal RR and effort noted  GI: soft, mild LLQ tenderness, with active bowel sounds. No guarding or palpable organomegaly noted  Skin; warm and dry, no rash  or jaundice noted  Neuro: awake, alert and oriented x 3. Normal gross motor function and fluent speech  Labs:  None to review  Radiologic Studies:  None to review  Assessment: Encounter Diagnoses  Name Primary?  Marland Kitchen LLQ abdominal pain Yes  . Slow transit constipation   . Abdominal bloating     Sounds most like IBS, less likely obstructive or pelvic floor dysfunction. I don't think it is  affecting her ability to conceive. While the dicyclomine has helped her cramps, it is probably worsening constipation. Plan:  Colonoscopy. If negative, probably a trial of entities (S. IBS dietary advice.  Thank you for the courtesy of this consult.  Please call me with any questions or concerns.  Charlie Pitter III

## 2015-05-18 NOTE — Patient Instructions (Addendum)
Food Guidelines for a sensitive stomach  Many people have difficulty digesting certain foods, causing a variety of distressing and embarrassing symptoms such as abdominal pain, bloating and gas.  These foods may need to be avoided or consumed in small amounts.  Here are some tips that might be helpful for you.  1.   Lactose intolerance is the difficulty or complete inability to digest lactose, the natural sugar in milk and anything made from milk.  This condition is harmless, common, and can begin any time during life.  Some people can digest a modest amount of lactose while others cannot tolerate any.  Also, not all dairy products contain equal amounts of lactose.  For example, hard cheeses such as parmesan have less lactose than soft cheeses such as cheddar.  Yogurt has less lactose than milk or cheese.  Many packaged foods (even many brands of bread) have milk, so read ingredient lists carefully.  It is difficult to test for lactose intolerance, so just try avoiding lactose as much as possible for a week and see what happens with your symptoms.  If you seem to be lactose intolerant, the best plan is to avoid it (but make sure you get calcium from another source).  The next best thing is to use lactase enzyme supplements, available over the counter everywhere.  Just know that many lactose intolerant people need to take several tablets with each serving of dairy to avoid symptoms.  Lastly, a lot of restaurant food is made with milk or butter.  Many are things you might not suspect, such as mashed potatoes, rice and pasta (cooked with butter) and "grilled" items.  If you are lactose intolerant, it never hurts to ask your server what has milk or butter.  2.   Fiber is an important part of your diet, but not all fiber is well-tolerated.  Insoluble fiber such as bran is often consumed by normal gut bacteria and converted into gas.  Soluble fiber such as oats, squash, carrots and green beans are typically  tolerated better.  3.   Some types of carbohydrates can be poorly digested.  Examples include: fructose (apples, cherries, pears, raisins and other dried fruits), fructans (onions, zucchini, large amounts of wheat), sorbitol/mannitol/xylitol and sucralose/Splenda (common artificial sweeteners), and raffinose (lentils, broccoli, cabbage, asparagus, brussel sprouts, many types of beans).  Do a Programmer, multimedia for [place] and you will find helpful information. Beano, a dietary supplement, will often help with raffinose-containing foods.  As with lactase tablets, you may need several per serving.  4.   Whenever possible, avoid processed food&meats and chemical additives.  High fructose corn syrup, a common sweetener, may be difficult to digest.  Eggs and soy (comes from the soybean, and added to many foods now) are the other most common bloating/gassy foods.  - Dr. [facility]   You have been scheduled for a colonoscopy. Please follow written instructions given to you at your visit today.  Please pick up your prep supplies at the pharmacy within the next 1-3 days. If you use inhalers (even only as needed), please bring them with you on the day of your procedure. Your physician has requested that you go to www.startemmi.com and enter the access code given to you at your visit today. This web site gives a general overview about your procedure. However, you should still follow specific instructions given to you by our office regarding your preparation for the procedure.

## 2015-05-25 ENCOUNTER — Encounter: Payer: Medicaid Other | Admitting: Gastroenterology

## 2015-06-05 ENCOUNTER — Telehealth: Payer: Self-pay | Admitting: Gastroenterology

## 2015-06-07 NOTE — Telephone Encounter (Signed)
Left message for patient to call back  

## 2015-06-07 NOTE — Telephone Encounter (Signed)
I have left voicemail to advise patient that miralax prep medications (dulcolax, miralax) are over the counter medications that can be purchased from the laxative section of her drug store.

## 2015-06-07 NOTE — Telephone Encounter (Signed)
Patient returned phone call. She states that it is ok to leave a detailed message.

## 2015-06-09 ENCOUNTER — Encounter: Payer: Medicaid Other | Admitting: Gastroenterology

## 2015-06-19 ENCOUNTER — Ambulatory Visit (AMBULATORY_SURGERY_CENTER): Payer: Medicaid Other | Admitting: Gastroenterology

## 2015-06-19 ENCOUNTER — Encounter: Payer: Self-pay | Admitting: Gastroenterology

## 2015-06-19 VITALS — BP 108/67 | HR 70 | Temp 98.7°F | Resp 13 | Ht 64.0 in | Wt 235.0 lb

## 2015-06-19 DIAGNOSIS — R1032 Left lower quadrant pain: Secondary | ICD-10-CM

## 2015-06-19 MED ORDER — SODIUM CHLORIDE 0.9 % IV SOLN: 500.0000 mL | INTRAVENOUS | Status: DC

## 2015-06-19 NOTE — Patient Instructions (Signed)
YOU HAD AN ENDOSCOPIC PROCEDURE TODAY AT THE [facility]:   Refer to the procedure report that was given to you for any specific questions about what was found during the examination.  If the procedure report does not answer your questions, please call your gastroenterologist to clarify.  If you requested that your care partner not be given the details of your procedure findings, then the procedure report has been included in a sealed envelope for you to review at your convenience later.  YOU SHOULD EXPECT: Some feelings of bloating in the abdomen. Passage of more gas than usual.  Walking can help get rid of the air that was put into your GI tract during the procedure and reduce the bloating. If you had a lower endoscopy (such as a colonoscopy or flexible sigmoidoscopy) you may notice spotting of blood in your stool or on the toilet paper. If you underwent a bowel prep for your procedure, you may not have a normal bowel movement for a few days.  Please Note:  You might notice some irritation and congestion in your nose or some drainage.  This is from the oxygen used during your procedure.  There is no need for concern and it should clear up in a day or so.  SYMPTOMS TO REPORT IMMEDIATELY:   Following lower endoscopy (colonoscopy or flexible sigmoidoscopy):  Excessive amounts of blood in the stool  Significant tenderness or worsening of abdominal pains  Swelling of the abdomen that is new, acute  Fever of 100F or higher   For urgent or emergent issues, a gastroenterologist can be reached at any hour by calling [phone].   DIET: Your first meal following the procedure should be a small meal and then it is ok to progress to your normal diet. Heavy or fried foods are harder to digest and may make you feel nauseous or bloated.  Likewise, meals heavy in dairy and vegetables can increase bloating.  Drink plenty of fluids but you should avoid alcoholic beverages for 24  hours.  ACTIVITY:  You should plan to take it easy for the rest of today and you should NOT DRIVE or use heavy machinery until tomorrow (because of the sedation medicines used during the test).    FOLLOW UP: Our staff will call the number listed on your records the next business day following your procedure to check on you and address any questions or concerns that you may have regarding the information given to you following your procedure. If we do not reach you, we will leave a message.  However, if you are feeling well and you are not experiencing any problems, there is no need to return our call.  We will assume that you have returned to your regular daily activities without incident.  If any biopsies were taken you will be contacted by phone or by letter within the next 1-3 weeks.  Please call us at [phone] if you have not heard about the biopsies in 3 weeks.    SIGNATURES/CONFIDENTIALITY: You and/or your care partner have signed paperwork which will be entered into your electronic medical record.  These signatures attest to the fact that that the information above on your After Visit Summary has been reviewed and is understood.  Full responsibility of the confidentiality of this discharge information lies with you and/or your care-partner.   Resume medications.  Call to follow up with doctor in 1 month.

## 2015-06-19 NOTE — Op Note (Signed)
[facility] Patient Name: Ellen Booker Procedure Date: 06/19/2015 10:26 AM MRN: [id number] Endoscopist: Sherilyn Cooter L. Myrtie Neither , MD Age: 35 Referring MD:  Date of Birth: 1980-11-11 Gender: Female Procedure:                Colonoscopy Indications:              Abdominal pain in the left lower quadrant,                            Constipation Medicines:                Monitored Anesthesia Care Procedure:                Pre-Anesthesia Assessment:                           - Prior to the procedure, a History and Physical                            was performed, and patient medications and                            allergies were reviewed. The patient's tolerance of                            previous anesthesia was also reviewed. The risks                            and benefits of the procedure and the sedation                            options and risks were discussed with the patient.                            All questions were answered, and informed consent                            was obtained. Prior Anticoagulants: The patient has                            taken no previous anticoagulant or antiplatelet                            agents. ASA Grade Assessment: II - A patient with                            mild systemic disease. After reviewing the risks                            and benefits, the patient was deemed in                            satisfactory condition to undergo the procedure.  After obtaining informed consent, the colonoscope                            was passed under direct vision. Throughout the                            procedure, the patient's blood pressure, pulse, and                            oxygen saturations were monitored continuously. The                            Model CF-HQ190L [id number]) scope was introduced                            through the anus and advanced to the the cecum,    identified by appendiceal orifice and ileocecal                            valve. The colonoscopy was performed without                            difficulty. The patient tolerated the procedure                            well. The quality of the bowel preparation was                            good. The ileocecal valve, appendiceal orifice, and                            rectum were photographed. The bowel preparation                            used was Miralax. Scope In: 10:47:02 AM Scope Out: 10:57:47 AM Scope Withdrawal Time: 0 hours 5 minutes 28 seconds  Total Procedure Duration: 0 hours 10 minutes 45 seconds  Findings:      The perianal and digital rectal examinations were normal.      The entire examined colon appeared normal on direct and retroflexion       views. Complications:            No immediate complications. Estimated Blood Loss:     Estimated blood loss: none. Impression:               - The entire examined colon is normal on direct and                            retroflexion views.                           - No specimens collected. Recommendation:           - Patient has a contact number available for  emergencies. The signs and symptoms of potential                            delayed complications were discussed with the                            patient. Return to normal activities tomorrow.                            Written discharge instructions were provided to the                            patient.                           - Resume previous diet.                           - Continue present medications.                           - No recommendation at this time regarding repeat                            colonoscopy due to no evidence of mucosal or other                            abnormalities on today's exam.                           - Return to my office in 1 month.                           - Consider a trial of Linzess for  IBS if no                            contraindication in pregnancy (patient attempting                            to conceive). Procedure Code(s):        --- Professional ---                           [id number], Colonoscopy, flexible; diagnostic, including                            collection of specimen(s) by brushing or washing,                            when performed (separate procedure) CPT copyright 2016 American Medical Association. All rights reserved. Ellen Booker L. Myrtie Neitheranis, MD 06/19/2015 11:04:52 AM This report has been signed electronically. Number of Addenda: 0 Referring MD:      Bing Neighborsharles A. Clearance CootsHarper

## 2015-06-19 NOTE — Progress Notes (Signed)
Pt alert and talking, vss. Report given to RN

## 2015-06-20 ENCOUNTER — Telehealth: Payer: Self-pay

## 2015-06-20 ENCOUNTER — Telehealth: Payer: Self-pay | Admitting: Gastroenterology

## 2015-06-20 MED ORDER — LINACLOTIDE 145 MCG PO CAPS: 145.0000 ug | ORAL_CAPSULE | Freq: Every day | ORAL | Status: DC

## 2015-06-20 NOTE — Telephone Encounter (Signed)
Patty,    Please let Erie NoeVanessa know that I looked up Linzess, a medication I was considering using for her IBS, and there is no contraindication in pregnancy. She is not pregnant now, but she and her husband have been actively trying to conceive. Therefore, I would like to try this medication for her IBS. I have prescribed the lower of the 2 doses, 145 g once daily. Take it once a day, about a half hour prior to either her breakfast or supper meal. Give us a call in a few weeks with an update.

## 2015-06-20 NOTE — Telephone Encounter (Signed)
Left a message for patient to call back. 

## 2015-06-20 NOTE — Telephone Encounter (Signed)
Patient notified of recommendations. She will call back with update.

## 2015-06-20 NOTE — Telephone Encounter (Signed)
  Follow up Call-  Call back number 06/19/2015  Post procedure Call Back phone  # [phone]  Permission to leave phone message Yes    Patient was called for follow up after her procedure on 06/19/2015. No answer at the number given for follow up phone call. A message was left on the answering machine.

## 2015-08-17 ENCOUNTER — Telehealth: Payer: Self-pay | Admitting: *Deleted

## 2015-08-17 NOTE — Telephone Encounter (Signed)
Patient is requesting Metrogel and Diflucan. She is out of refills.

## 2015-08-22 ENCOUNTER — Other Ambulatory Visit: Payer: Self-pay | Admitting: Obstetrics

## 2015-08-23 ENCOUNTER — Ambulatory Visit: Payer: Medicaid Other | Admitting: Gastroenterology

## 2015-08-24 ENCOUNTER — Ambulatory Visit: Payer: Medicaid Other | Admitting: Gastroenterology

## 2015-09-19 ENCOUNTER — Encounter: Payer: Self-pay | Admitting: Obstetrics

## 2015-09-19 ENCOUNTER — Ambulatory Visit (INDEPENDENT_AMBULATORY_CARE_PROVIDER_SITE_OTHER): Payer: Medicaid Other | Admitting: Obstetrics

## 2015-09-19 VITALS — BP 137/89 | HR 93 | Wt 243.0 lb

## 2015-09-19 DIAGNOSIS — E289 Ovarian dysfunction, unspecified: Secondary | ICD-10-CM | POA: Diagnosis not present

## 2015-09-19 DIAGNOSIS — Z719 Counseling, unspecified: Secondary | ICD-10-CM

## 2015-09-19 DIAGNOSIS — Z3169 Encounter for other general counseling and advice on procreation: Secondary | ICD-10-CM

## 2015-09-19 DIAGNOSIS — Z1389 Encounter for screening for other disorder: Secondary | ICD-10-CM

## 2015-09-19 DIAGNOSIS — E669 Obesity, unspecified: Secondary | ICD-10-CM

## 2015-09-19 LAB — POCT URINALYSIS DIPSTICK
Bilirubin, UA: NEGATIVE
GLUCOSE UA: NEGATIVE
Ketones, UA: NEGATIVE
LEUKOCYTES UA: NEGATIVE
NITRITE UA: NEGATIVE
PH UA: 5
RBC UA: NEGATIVE
Spec Grav, UA: 1.025
UROBILINOGEN UA: NEGATIVE

## 2015-09-19 MED ORDER — PHENTERMINE HCL 37.5 MG PO CAPS: 37.5000 mg | ORAL_CAPSULE | ORAL | Status: DC

## 2015-09-19 MED ORDER — OB COMPLETE PETITE 35-5-1-200 MG PO CAPS: 1.0000 | ORAL_CAPSULE | Freq: Every day | ORAL | Status: DC

## 2015-09-19 NOTE — Progress Notes (Signed)
Patient ID: Ellen Booker, female   DOB: Sep 12, 1980, 35 y.o.   MRN: [id number]  Chief Complaint  Patient presents with  . Consulut    Pregnancy options    HPI Ellen Booker is a 35 y.o. female.  Concerned about weight gain.  Still trying to get pregnant.  Recently evaluated by GI for abdominal pain and bloating, and started on Linzess, with improvement. HPI  Past Medical History  Diagnosis Date  . Helicobacter pylori (H. pylori)     Past Surgical History  Procedure Laterality Date  . Uterine fibroid surgery    . C-section      Family History  Problem Relation Age of Onset  . HIV Father   . Uterine cancer      Social History Social History  Substance Use Topics  . Smoking status: Current Every Day Smoker -- 0.50 packs/day for 15 years    Types: Cigarettes  . Smokeless tobacco: Never Used  . Alcohol Use: No    No Known Allergies  Current Outpatient Prescriptions  Medication Sig Dispense Refill  . Linaclotide (LINZESS) 145 MCG CAPS capsule Take 1 capsule (145 mcg total) by mouth daily before breakfast. 30 capsule 0  . metFORMIN (GLUCOPHAGE XR) 500 MG 24 hr tablet Take 1 tablet (500 mg total) by mouth daily with breakfast. 120 tablet 11  . metroNIDAZOLE (FLAGYL) 500 MG tablet take 1 tablet by mouth twice a day 14 tablet 2  . phentermine 37.5 MG capsule Take 1 capsule (37.5 mg total) by mouth every morning. 30 capsule 2  . Prenat-FeCbn-FeAspGl-FA-Omega (OB COMPLETE PETITE) 35-5-1-200 MG CAPS Take 1 capsule by mouth daily before breakfast. 30 capsule 11   No current facility-administered medications for this visit.    Review of Systems Review of Systems Constitutional: positive for weight gain Respiratory: negative for cough and wheezing Cardiovascular: negative for chest pain, fatigue and palpitations Gastrointestinal: positive for abdominal pain and change in bowel habits Genitourinary:negative Integument/breast: negative for nipple  discharge Musculoskeletal:negative for myalgias Neurological: negative for gait problems and tremors Behavioral/Psych: negative for abusive relationship, depression Endocrine: negative for temperature intolerance     Blood pressure 137/89, pulse 93, weight 243 lb (110.224 kg), last menstrual period 08/30/2015.  Physical Exam Physical Exam:  Deferred  100% of 10 min visit spent on counseling and coordination of care.   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 POCT urinalysis dipstick   Meds ordered this encounter  Medications  . phentermine 37.5 MG capsule    Sig: Take 1 capsule (37.5 mg total) by mouth every morning.    Dispense:  30 capsule    Refill:  2  . Prenat-FeCbn-FeAspGl-FA-Omega (OB COMPLETE PETITE) 35-5-1-200 MG CAPS    Sig: Take 1 capsule by mouth daily before breakfast.    Dispense:  30 capsule    Refill:  11

## 2015-12-25 ENCOUNTER — Other Ambulatory Visit: Payer: Self-pay | Admitting: Obstetrics

## 2015-12-25 DIAGNOSIS — B3731 Acute candidiasis of vulva and vagina: Secondary | ICD-10-CM

## 2015-12-25 DIAGNOSIS — B373 Candidiasis of vulva and vagina: Secondary | ICD-10-CM

## 2016-01-01 ENCOUNTER — Other Ambulatory Visit: Payer: Self-pay | Admitting: Obstetrics

## 2016-01-04 ENCOUNTER — Other Ambulatory Visit: Payer: Self-pay | Admitting: Obstetrics

## 2016-01-05 ENCOUNTER — Telehealth: Payer: Self-pay | Admitting: *Deleted

## 2016-01-05 NOTE — Telephone Encounter (Signed)
Patient wants to know if she can have another Rx for clomid. She states she took it at the wrong time in her cycle and she wants to try again.

## 2016-01-11 NOTE — Telephone Encounter (Signed)
No further Rx for Clomid. She will need referral to MFM

## 2016-02-01 NOTE — Telephone Encounter (Signed)
LM on VM- that we can do referral when patient is ready

## 2016-02-28 ENCOUNTER — Other Ambulatory Visit: Payer: Self-pay | Admitting: Obstetrics

## 2016-03-04 ENCOUNTER — Encounter: Payer: Self-pay | Admitting: Obstetrics

## 2016-03-04 ENCOUNTER — Ambulatory Visit (INDEPENDENT_AMBULATORY_CARE_PROVIDER_SITE_OTHER): Payer: Medicaid Other | Admitting: Obstetrics

## 2016-03-04 ENCOUNTER — Encounter: Payer: Self-pay | Admitting: *Deleted

## 2016-03-04 VITALS — BP 119/82 | HR 92 | Wt 241.0 lb

## 2016-03-04 DIAGNOSIS — R102 Pelvic and perineal pain: Secondary | ICD-10-CM | POA: Diagnosis not present

## 2016-03-04 DIAGNOSIS — R399 Unspecified symptoms and signs involving the genitourinary system: Secondary | ICD-10-CM | POA: Diagnosis not present

## 2016-03-04 DIAGNOSIS — IMO0001 Reserved for inherently not codable concepts without codable children: Secondary | ICD-10-CM

## 2016-03-04 DIAGNOSIS — F17200 Nicotine dependence, unspecified, uncomplicated: Secondary | ICD-10-CM

## 2016-03-04 LAB — POCT URINALYSIS DIPSTICK
BILIRUBIN UA: NEGATIVE
GLUCOSE UA: NEGATIVE
KETONES UA: NEGATIVE
Leukocytes, UA: NEGATIVE
Nitrite, UA: NEGATIVE
Protein, UA: NEGATIVE
RBC UA: NEGATIVE
SPEC GRAV UA: 1.015
Urobilinogen, UA: NEGATIVE
pH, UA: 6

## 2016-03-04 MED ORDER — IBUPROFEN 800 MG PO TABS: 800.0000 mg | ORAL_TABLET | Freq: Three times a day (TID) | ORAL | 5 refills | Status: DC | PRN

## 2016-03-04 NOTE — Progress Notes (Signed)
Subjective:        Ellen Booker is a 35 y.o. female here for a routine exam.  Current complaints: Left sided pelvic pain for the past 2 months, worsening over the past few weeks.Marland Kitchen.  PSH significant for exploratory laparotomy and lysis of adhesions after C/S.  Personal health questionnaire:  Is patient [name], have a family history of breast and/or ovarian cancer: no Is there a family history of uterine cancer diagnosed at age < 1850, gastrointestinal cancer, urinary tract cancer, family member who is a Personnel officerLynch syndrome-associated carrier: no Is the patient overweight and hypertensive, family history of diabetes, personal history of gestational diabetes, preeclampsia or PCOS: no Is patient over 2755, have PCOS,  family history of premature CHD under age 34, diabetes, smoke, have hypertension or peripheral artery disease:  no At any time, has a partner hit, kicked or otherwise hurt or frightened you?: no Over the past 2 weeks, have you felt down, depressed or hopeless?: no Over the past 2 weeks, have you felt little interest or pleasure in doing things?:no   Gynecologic History Patient's last menstrual period was 02/19/2016. Contraception: none Last Pap: 2017. Results were: normal Last mammogram: n/a. Results were: n/a  Obstetric History OB History  Gravida Para Term Preterm AB Living  1 1 1     1   SAB TAB Ectopic Multiple Live Births          1    # Outcome Date GA Lbr Len/2nd Weight Sex Delivery Anes PTL Lv  1 Term  [id number]    CS-LTranv   LIV      Past Medical History:  Diagnosis Date  . Helicobacter pylori (H. pylori)     Past Surgical History:  Procedure Laterality Date  . c-section    . UTERINE FIBROID SURGERY       Current Outpatient Prescriptions:  .  metFORMIN (GLUCOPHAGE XR) 500 MG 24 hr tablet, Take 1 tablet (500 mg total) by mouth daily with breakfast., Disp: 120 tablet, Rfl: 11 .  Prenat-FeCbn-FeAspGl-FA-Omega (OB COMPLETE PETITE) 35-5-1-200 MG CAPS,  Take 1 capsule by mouth daily before breakfast., Disp: 30 capsule, Rfl: 11 .  Linaclotide (LINZESS) 145 MCG CAPS capsule, Take 1 capsule (145 mcg total) by mouth daily before breakfast., Disp: 30 capsule, Rfl: 0 .  phentermine 37.5 MG capsule, Take 1 capsule (37.5 mg total) by mouth every morning. (Patient not taking: Reported on 03/04/2016), Disp: 30 capsule, Rfl: 2 No Known Allergies  Social History  Substance Use Topics  . Smoking status: Current Every Day Smoker    Packs/day: 0.50    Years: 15.00    Types: Cigarettes  . Smokeless tobacco: Never Used  . Alcohol use No    Family History  Problem Relation Age of Onset  . HIV Father   . Uterine cancer        Review of Systems  Constitutional: negative for fatigue and weight loss Respiratory: negative for cough and wheezing Cardiovascular: negative for chest pain, fatigue and palpitations Gastrointestinal: negative for abdominal pain and change in bowel habits Musculoskeletal:negative for myalgias Neurological: negative for gait problems and tremors Behavioral/Psych: negative for abusive relationship, depression Endocrine: negative for temperature intolerance    Genitourinary:positive for pelvic pain and urinary frequency Integument/breast: negative for breast lump, breast tenderness, nipple discharge and skin lesion(s)    Objective:       BP 119/82   Pulse 92   Wt 241 lb (109.3 kg)   LMP 02/19/2016   BMI  41.37 kg/m  General:   alert  Skin:   no rash or abnormalities  Lungs:   clear to auscultation bilaterally  Heart:   regular rate and rhythm, S1, S2 normal, no murmur, click, rub or gallop  Breasts:   normal without suspicious masses, skin or nipple changes or axillary nodes  Abdomen:  normal findings: no organomegaly, soft, non-tender and no hernia  Pelvis:  External genitalia: normal general appearance Urinary system: urethral meatus normal and bladder without fullness, nontender Vaginal: normal without  tenderness, induration or masses Cervix: normal appearance Adnexa: left adnexal tenderness Uterus: anteverted and tender   Lab Review Urine pregnancy test Labs reviewed yes Radiologic studies reviewed yes  50% of 15 min visit spent on counseling and coordination of care.    Assessment:    Pelvic pain  Obesity  UTI symptoms   Tobacco dependence  Ovulation disorder, possible PCOS   Plan:    Need to obtain medical records with surgical history  Education reviewed: depression evaluation, low fat, low cholesterol diet, safe sex/STD prevention, self breast exams, smoking cessation and weight bearing exercise. Contraception: none. Follow up in: 1 week.    No orders of the defined types were placed in this encounter.  Orders Placed This Encounter  Procedures  . US Pelvis Complete    Standing Status:   Future    Standing Expiration Date:   05/05/2017    Order Specific Question:   Reason for Exam (SYMPTOM  OR DIAGNOSIS REQUIRED)    Answer:   Pelvic pain    Order Specific Question:   Preferred imaging location?    [facility]  . US Transvaginal Non-OB    Standing Status:   Future    Standing Expiration Date:   05/05/2017    Order Specific Question:   Reason for Exam (SYMPTOM  OR DIAGNOSIS REQUIRED)    Answer:   Pelvic pain    Order Specific Question:   Preferred imaging location?    [facility]  . POCT urinalysis dipstick

## 2016-03-06 LAB — URINE CULTURE

## 2016-03-07 ENCOUNTER — Other Ambulatory Visit: Payer: Self-pay | Admitting: Obstetrics

## 2016-03-07 DIAGNOSIS — B373 Candidiasis of vulva and vagina: Secondary | ICD-10-CM

## 2016-03-07 DIAGNOSIS — N760 Acute vaginitis: Principal | ICD-10-CM

## 2016-03-07 DIAGNOSIS — B3731 Acute candidiasis of vulva and vagina: Secondary | ICD-10-CM

## 2016-03-07 DIAGNOSIS — B9689 Other specified bacterial agents as the cause of diseases classified elsewhere: Secondary | ICD-10-CM

## 2016-03-07 LAB — NUSWAB BV AND CANDIDA, NAA
Atopobium vaginae: HIGH Score — AB
CANDIDA ALBICANS, NAA: POSITIVE — AB
Candida glabrata, NAA: NEGATIVE
MEGASPHAERA 1: HIGH {score} — AB

## 2016-03-07 MED ORDER — METRONIDAZOLE 500 MG PO TABS: 500.0000 mg | ORAL_TABLET | Freq: Two times a day (BID) | ORAL | 2 refills | Status: DC

## 2016-03-07 MED ORDER — FLUCONAZOLE 150 MG PO TABS: 150.0000 mg | ORAL_TABLET | Freq: Once | ORAL | 2 refills | Status: AC

## 2016-03-11 ENCOUNTER — Ambulatory Visit: Payer: Self-pay | Admitting: Obstetrics

## 2016-03-14 ENCOUNTER — Encounter: Payer: Self-pay | Admitting: *Deleted

## 2016-03-14 ENCOUNTER — Ambulatory Visit (INDEPENDENT_AMBULATORY_CARE_PROVIDER_SITE_OTHER): Payer: Medicaid Other

## 2016-03-14 ENCOUNTER — Ambulatory Visit (INDEPENDENT_AMBULATORY_CARE_PROVIDER_SITE_OTHER): Payer: Medicaid Other | Admitting: Obstetrics

## 2016-03-14 ENCOUNTER — Encounter: Payer: Self-pay | Admitting: Obstetrics

## 2016-03-14 VITALS — BP 129/78 | HR 76 | Wt 248.0 lb

## 2016-03-14 DIAGNOSIS — R102 Pelvic and perineal pain: Secondary | ICD-10-CM

## 2016-03-14 NOTE — Progress Notes (Signed)
Patient ID: Ellen Booker, female   DOB: 1980-04-10, 35 y.o.   MRN: [id number]  Chief Complaint  Patient presents with  . Follow-up    US Results    HPI Ellen Booker is a 35 y.o. female.  Pelvic pain. HPI  Past Medical History:  Diagnosis Date  . Helicobacter pylori (H. pylori)     Past Surgical History:  Procedure Laterality Date  . c-section    . UTERINE FIBROID SURGERY      Family History  Problem Relation Age of Onset  . HIV Father   . Uterine cancer      Social History Social History  Substance Use Topics  . Smoking status: Current Every Day Smoker    Packs/day: 0.50    Years: 15.00    Types: Cigarettes  . Smokeless tobacco: Never Used  . Alcohol use No    No Known Allergies  Current Outpatient Prescriptions  Medication Sig Dispense Refill  . ibuprofen (ADVIL,MOTRIN) 800 MG tablet Take 1 tablet (800 mg total) by mouth every 8 (eight) hours as needed. 30 tablet 5  . metFORMIN (GLUCOPHAGE XR) 500 MG 24 hr tablet Take 1 tablet (500 mg total) by mouth daily with breakfast. 120 tablet 11  . metroNIDAZOLE (FLAGYL) 500 MG tablet Take 1 tablet (500 mg total) by mouth 2 (two) times daily. 14 tablet 2  . Prenat-FeCbn-FeAspGl-FA-Omega (OB COMPLETE PETITE) 35-5-1-200 MG CAPS Take 1 capsule by mouth daily before breakfast. 30 capsule 11  . Linaclotide (LINZESS) 145 MCG CAPS capsule Take 1 capsule (145 mcg total) by mouth daily before breakfast. (Patient not taking: Reported on 03/14/2016) 30 capsule 0  . phentermine 37.5 MG capsule Take 1 capsule (37.5 mg total) by mouth every morning. (Patient not taking: Reported on 03/04/2016) 30 capsule 2   No current facility-administered medications for this visit.     Review of Systems Review of Systems Constitutional: negative for fatigue and weight loss Respiratory: negative for cough and wheezing Cardiovascular: negative for chest pain, fatigue and palpitations Gastrointestinal: negative for abdominal pain and change in  bowel habits Genitourinary:positive for pelvic pain Integument/breast: negative for nipple discharge Musculoskeletal:negative for myalgias Neurological: negative for gait problems and tremors Behavioral/Psych: negative for abusive relationship, depression Endocrine: negative for temperature intolerance      Blood pressure 129/78, pulse 76, weight 248 lb (112.5 kg), last menstrual period 02/19/2016.  Physical Exam Physical Exam:  Deferred  50% of 10 min visit spent on counseling and coordination of care.    Data Reviewed Ultrasound:  WNL's  Assessment     Pelvic pain Morbid obesity    Plan    Will follow clinically. Patient to make F/U appointment with GI for possible IBS etiology.  No orders of the defined types were placed in this encounter.  No orders of the defined types were placed in this encounter.

## 2016-03-20 ENCOUNTER — Ambulatory Visit: Payer: Medicaid Other | Admitting: Obstetrics

## 2016-03-29 ENCOUNTER — Encounter (HOSPITAL_COMMUNITY): Payer: Self-pay

## 2016-03-29 ENCOUNTER — Emergency Department (HOSPITAL_COMMUNITY)
Admission: EM | Admit: 2016-03-29 | Discharge: 2016-03-29 | Disposition: A | Discharge status: Home / Self Care | Payer: Medicaid Other | Attending: Emergency Medicine | Admitting: Emergency Medicine

## 2016-03-29 DIAGNOSIS — N72 Inflammatory disease of cervix uteri: Secondary | ICD-10-CM | POA: Insufficient documentation

## 2016-03-29 DIAGNOSIS — F17210 Nicotine dependence, cigarettes, uncomplicated: Secondary | ICD-10-CM | POA: Diagnosis not present

## 2016-03-29 DIAGNOSIS — I10 Essential (primary) hypertension: Secondary | ICD-10-CM | POA: Diagnosis not present

## 2016-03-29 DIAGNOSIS — R1030 Lower abdominal pain, unspecified: Secondary | ICD-10-CM | POA: Diagnosis present

## 2016-03-29 LAB — CBC
HCT: 35.9 % — ABNORMAL LOW (ref 36.0–46.0)
Hemoglobin: 11.7 g/dL — ABNORMAL LOW (ref 12.0–15.0)
MCH: 28.5 pg (ref 26.0–34.0)
MCHC: 32.6 g/dL (ref 30.0–36.0)
MCV: 87.3 fL (ref 78.0–100.0)
PLATELETS: 365 10*3/uL (ref 150–400)
RBC: 4.11 MIL/uL (ref 3.87–5.11)
RDW: 14.2 % (ref 11.5–15.5)
WBC: 10.8 10*3/uL — AB (ref 4.0–10.5)

## 2016-03-29 LAB — I-STAT BETA HCG BLOOD, ED (MC, WL, AP ONLY): I-stat hCG, quantitative: <5 m[IU]/mL (ref <5)

## 2016-03-29 LAB — URINALYSIS, ROUTINE W REFLEX MICROSCOPIC
Bilirubin Urine: NEGATIVE
GLUCOSE, UA: NEGATIVE mg/dL
HGB URINE DIPSTICK: NEGATIVE
KETONES UR: NEGATIVE mg/dL
LEUKOCYTES UA: NEGATIVE
NITRITE: NEGATIVE
PH: 6 (ref 5.0–8.0)
PROTEIN: NEGATIVE mg/dL
Specific Gravity, Urine: 1.01 (ref 1.005–1.030)

## 2016-03-29 LAB — WET PREP, GENITAL
Clue Cells Wet Prep HPF POC: NONE SEEN
SPERM: NONE SEEN
Trich, Wet Prep: NONE SEEN
WBC WET PREP: NONE SEEN
YEAST WET PREP: NONE SEEN

## 2016-03-29 LAB — COMPREHENSIVE METABOLIC PANEL
ALT: 14 U/L (ref 14–54)
AST: 20 U/L (ref 15–41)
Albumin: 3.9 g/dL (ref 3.5–5.0)
Alkaline Phosphatase: 49 U/L (ref 38–126)
Anion gap: 7 (ref 5–15)
BILIRUBIN TOTAL: 0.2 mg/dL — AB (ref 0.3–1.2)
BUN: 9 mg/dL (ref 6–20)
CALCIUM: 8.8 mg/dL — AB (ref 8.9–10.3)
CHLORIDE: 105 mmol/L (ref 101–111)
CO2: 25 mmol/L (ref 22–32)
CREATININE: 0.68 mg/dL (ref 0.44–1.00)
GFR calc Af Amer: >60 mL/min (ref >60)
Glucose, Bld: 105 mg/dL — ABNORMAL HIGH (ref 65–99)
Potassium: 4 mmol/L (ref 3.5–5.1)
Sodium: 137 mmol/L (ref 135–145)
Total Protein: 7 g/dL (ref 6.5–8.1)

## 2016-03-29 LAB — LIPASE, BLOOD: LIPASE: 34 U/L (ref 11–51)

## 2016-03-29 MED ORDER — DICYCLOMINE HCL 10 MG PO CAPS
10.0000 mg | ORAL_CAPSULE | Freq: Once | ORAL | Status: AC
  Administered 2016-03-29: 10 mg via ORAL
  Filled 2016-03-29: qty 1

## 2016-03-29 MED ORDER — OXYCODONE HCL 5 MG PO TABS
5.0000 mg | ORAL_TABLET | Freq: Once | ORAL | Status: AC
  Administered 2016-03-29: 5 mg via ORAL
  Filled 2016-03-29: qty 1

## 2016-03-29 MED ORDER — ACETAMINOPHEN 500 MG PO TABS
1000.0000 mg | ORAL_TABLET | Freq: Once | ORAL | Status: AC
  Administered 2016-03-29: 1000 mg via ORAL
  Filled 2016-03-29: qty 2

## 2016-03-29 MED ORDER — DIAZEPAM 5 MG PO TABS
5.0000 mg | ORAL_TABLET | Freq: Once | ORAL | Status: AC
  Administered 2016-03-29: 5 mg via ORAL
  Filled 2016-03-29: qty 1

## 2016-03-29 NOTE — ED Provider Notes (Signed)
[facility]-EMERGENCY DEPT Provider Note   CSN: [id number] Arrival date & time: 03/29/16  4098     History   Chief Complaint Chief Complaint  Patient presents with  . Abdominal Pain    HPI Ellen Booker is a 36 y.o. female.  36 yo F with a cc of lower abdominal pain.  Going on for a couple months.  Seen by GI with negative colonoscopy, seen by OB about two weeks ago.  Normal pelvic bedside US.  Patient continuing to have pain, worse to the suprapubic region.  Colicky.  Having normal BM's per patient.    The history is provided by the patient.  Abdominal Pain   This is a new problem. The current episode started 1 to 2 hours ago. The problem occurs constantly. The problem has not changed since onset.The pain is located in the suprapubic region. The quality of the pain is colicky. The pain is at a severity of 8/10. The pain is moderate. Pertinent negatives include fever, diarrhea, nausea, vomiting, constipation, dysuria, headaches, arthralgias and myalgias. Nothing aggravates the symptoms. Nothing relieves the symptoms.    Past Medical History:  Diagnosis Date  . Helicobacter pylori (H. pylori)     Patient Active Problem List   Diagnosis Date Noted  . Dysmenorrhea 02/01/2014  . Tobacco dependency 02/01/2014  . Obesity 12/09/2013  . Essential hypertension, benign 12/09/2013  . Candidiasis of vulva and vagina 12/08/2013  . Bilateral nipple discharge 11/08/2013  . 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 Unspecified disorder of menstruation and other abnormal bleeding from female genital tract 11/08/2013  . Irregular menstrual cycle 11/08/2013  . UTI (lower urinary tract infection) 01/29/2013  . 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 Well woman exam with routine gynecological exam 01/26/2013  . BMI 40.0-44.9, adult (HCC) 01/26/2013  . Elevated BP 01/26/2013    Past Surgical History:  Procedure Laterality Date  . c-section    . UTERINE FIBROID SURGERY      OB History      Gravida Para Term Preterm AB Living   1 1 1     1    SAB TAB Ectopic Multiple Live Births           1       Home Medications    Prior to Admission medications   Medication Sig Start Date End Date Taking? Authorizing Provider  ibuprofen (ADVIL,MOTRIN) 800 MG tablet Take 1 tablet (800 mg total) by mouth every 8 (eight) hours as needed. Patient taking differently: Take 800 mg by mouth every 8 (eight) hours as needed for headache, mild pain or moderate pain.  03/04/16  Yes Brock Bad, MD  metFORMIN (GLUCOPHAGE XR) 500 MG 24 hr tablet Take 1 tablet (500 mg total) by mouth daily with breakfast. 01/27/15  Yes Brock Bad, MD  vitamin B-12 (CYANOCOBALAMIN) 1000 MCG tablet Take 1,000 mcg by mouth every morning.   Yes Historical Provider, MD  Linaclotide Karlene Einstein) 145 MCG CAPS capsule Take 1 capsule (145 mcg total) by mouth daily before breakfast. Patient not taking: Reported on 03/29/2016 06/20/15   Charlie Pitter III, MD  metroNIDAZOLE (FLAGYL) 500 MG tablet Take 1 tablet (500 mg total) by mouth 2 (two) times daily. Patient not taking: Reported on 03/29/2016 03/07/16   Brock Bad, MD  phentermine 37.5 MG capsule Take 1 capsule (37.5 mg total) by mouth every morning. Patient not taking: Reported on 03/29/2016 09/19/15   Brock Bad, MD  Prenat-FeCbn-FeAspGl-FA-Omega (OB COMPLETE PETITE) 35-5-1-200 MG  CAPS Take 1 capsule by mouth daily before breakfast. Patient not taking: Reported on 03/29/2016 09/19/15   Brock Badharles A Harper, MD    Family History Family History  Problem Relation Age of Onset  . HIV Father   . Uterine cancer      Social History Social History  Substance Use Topics  . Smoking status: Current Every Day Smoker    Packs/day: 0.50    Years: 15.00    Types: Cigarettes  . Smokeless tobacco: Never Used  . Alcohol use No     Allergies   Patient has no known allergies.   Review of Systems Review of Systems  Constitutional: Negative for chills and fever.  HENT:  Negative for congestion and rhinorrhea.   Eyes: Negative for redness and visual disturbance.  Respiratory: Negative for shortness of breath and wheezing.   Cardiovascular: Negative for chest pain and palpitations.  Gastrointestinal: Positive for abdominal pain. Negative for constipation, diarrhea, nausea and vomiting.  Genitourinary: Positive for vaginal bleeding. Negative for dysuria and urgency.  Musculoskeletal: Negative for arthralgias and myalgias.  Skin: Negative for pallor and wound.  Neurological: Negative for dizziness and headaches.     Physical Exam Updated Vital Signs BP 152/85 (BP Location: Left Arm)   Pulse 94   Temp 97.8 F (36.6 C) (Oral)   Resp 18   LMP 03/21/2016   SpO2 98%   Physical Exam  Constitutional: She is oriented to person, place, and time. She appears well-developed and well-nourished. No distress.  HENT:  Head: Normocephalic and atraumatic.  Eyes: EOM are normal. Pupils are equal, round, and reactive to light.  Neck: Normal range of motion. Neck supple.  Cardiovascular: Normal rate and regular rhythm.  Exam reveals no gallop and no friction rub.   No murmur heard. Pulmonary/Chest: Effort normal. She has no wheezes. She has no rales.  Abdominal: Soft. She exhibits no distension and no mass. There is tenderness (worst just above the inguinal ligaments suprapubically). There is no guarding.  Genitourinary: Uterus is tender. Cervix exhibits motion tenderness and discharge (bloody). Right adnexum displays no mass, no tenderness and no fullness. Left adnexum displays no mass, no tenderness and no fullness.  Musculoskeletal: She exhibits no edema or tenderness.  Neurological: She is alert and oriented to person, place, and time.  Skin: Skin is warm and dry. She is not diaphoretic.  Psychiatric: She has a normal mood and affect. Her behavior is normal.  Nursing note and vitals reviewed.    ED Treatments / Results  Labs (all labs ordered are listed, but  only abnormal results are displayed) Labs Reviewed  COMPREHENSIVE METABOLIC PANEL - Abnormal; Notable for the following:       Result Value   Glucose, Bld 105 (*)    Calcium 8.8 (*)    Total Bilirubin 0.2 (*)    All other components within normal limits  CBC - Abnormal; Notable for the following:    WBC 10.8 (*)    Hemoglobin 11.7 (*)    HCT 35.9 (*)    All other components within normal limits  URINALYSIS, ROUTINE W REFLEX MICROSCOPIC - Abnormal; Notable for the following:    Color, Urine STRAW (*)    Bacteria, UA RARE (*)    Squamous Epithelial / LPF 0-5 (*)    All other components within normal limits  WET PREP, GENITAL  LIPASE, BLOOD  RPR  HIV ANTIBODY (ROUTINE TESTING)  I-STAT BETA HCG BLOOD, ED ([facility], [facility], [facility] ONLY)  GC/CHLAMYDIA PROBE AMP (  [place]) NOT AT [facility]    EKG  EKG Interpretation None       Radiology No results found.  Procedures Procedures (including critical care time)  Medications Ordered in ED Medications  acetaminophen (TYLENOL) tablet 1,000 mg (1,000 mg Oral Given 03/29/16 0927)  oxyCODONE (Oxy IR/ROXICODONE) immediate release tablet 5 mg (5 mg Oral Given 03/29/16 0927)  diazepam (VALIUM) tablet 5 mg (5 mg Oral Given 03/29/16 0927)  dicyclomine (BENTYL) capsule 10 mg (10 mg Oral Given 03/29/16 1610)     Initial Impression / Assessment and Plan / ED Course  I have reviewed the triage vital signs and the nursing notes.  Pertinent labs & imaging results that were available during my care of the patient were reviewed by me and considered in my medical decision making (see chart for details).  Clinical Course     36 yo F with lower abdominal pain.  Very low on exam, suspect pelvic etiology.  Seen at OB/GYN with bacterial vaginosis. They did not check a GC and chlamydia which the patient is high risk for since she has recently been treated for the same about 6 months ago. I will redo the pelvic exam. As the symptoms have been going on for 2 months that he  not suspect that imaging is warranted.  The patient had cervical motion tenderness on pelvic exam. I offered STD treatment which she declined at this time. States she has one partner that is her husband.   UA is negative for infection. Patient's wet prep is also negative. Discharge home. GYN and GI follow-up.  11:38 AM:  I have discussed the diagnosis/risks/treatment options with the patient and family and believe the pt to be eligible for discharge home to follow-up with GYN/GI. We also discussed returning to the ED immediately if new or worsening sx occur. We discussed the sx which are most concerning (e.g., sudden worsening pain, fever, inability to tolerate by mouth) that necessitate immediate return. Medications administered to the patient during their visit and any new prescriptions provided to the patient are listed below.  Medications given during this visit Medications  acetaminophen (TYLENOL) tablet 1,000 mg (1,000 mg Oral Given 03/29/16 0927)  oxyCODONE (Oxy IR/ROXICODONE) immediate release tablet 5 mg (5 mg Oral Given 03/29/16 0927)  diazepam (VALIUM) tablet 5 mg (5 mg Oral Given 03/29/16 0927)  dicyclomine (BENTYL) capsule 10 mg (10 mg Oral Given 03/29/16 9604)     The patient appears reasonably screen and/or stabilized for discharge and I doubt any other medical condition or other [facility] requiring further screening, evaluation, or treatment in the ED at this time prior to discharge.    Final Clinical Impressions(s) / ED Diagnoses   Final diagnoses:  Cervicitis    New Prescriptions New Prescriptions   No medications on file     Melene Plan, DO 03/29/16 1138

## 2016-03-29 NOTE — ED Triage Notes (Signed)
Pt here 2 weeks ago for same.  Having abdominal pain starting last night. Worse this am.  Pt denies n/v or urinary symptoms.  Has not had dx in past.  Referred to gastro.  To have appt on Monday.

## 2016-03-29 NOTE — Discharge Instructions (Signed)
Take 4 over the counter ibuprofen tablets 3 times a day or 2 over-the-counter naproxen tablets twice a day for pain. Also take tylenol 1000mg(2 extra strength) four times a day.    

## 2016-03-29 NOTE — ED Notes (Signed)
Pt made aware of need for urine specimen 

## 2016-03-30 LAB — HIV ANTIBODY (ROUTINE TESTING W REFLEX): HIV SCREEN 4TH GENERATION: NONREACTIVE

## 2016-03-30 LAB — RPR: RPR: NONREACTIVE

## 2016-04-01 ENCOUNTER — Encounter: Payer: Self-pay | Admitting: Gastroenterology

## 2016-04-01 ENCOUNTER — Other Ambulatory Visit: Payer: Medicaid Other

## 2016-04-01 ENCOUNTER — Ambulatory Visit (INDEPENDENT_AMBULATORY_CARE_PROVIDER_SITE_OTHER): Payer: Medicaid Other | Admitting: Gastroenterology

## 2016-04-01 VITALS — BP 120/70 | HR 89 | Ht 64.0 in | Wt 247.0 lb

## 2016-04-01 DIAGNOSIS — R1032 Left lower quadrant pain: Secondary | ICD-10-CM

## 2016-04-01 LAB — GC/CHLAMYDIA PROBE AMP (~~LOC~~) NOT AT ARMC
Chlamydia: NEGATIVE
Neisseria Gonorrhea: NEGATIVE

## 2016-04-01 MED ORDER — HYOSCYAMINE SULFATE 0.125 MG SL SUBL: 0.1250 mg | SUBLINGUAL_TABLET | Freq: Four times a day (QID) | SUBLINGUAL | 0 refills | Status: DC | PRN

## 2016-04-01 NOTE — Progress Notes (Signed)
[facility] Progress Note  Chief Complaint: Left lower quadrant pain  Subjective  History:  Ellen Booker returns to see me for left lower quadrant pain. I saw her for this in March 2017. At that time she also tended toward constipation, and it seemed most like IBS. Colonoscopy was normal, she did not improve much on a trial of Linzess.  Her bowel habits seem to have since regulated, and she has 2 or maybe 3 formed stools per day. Only about once a month that she have constipation and take the Linzess. There is no rectal bleeding. She is still bloated, has recently gained a few pounds, and is bothered by fairly constant left lower quadrant pain. It will bother her most of the day for 5 or 6 days straight, then maybe not for a day or 2, then begin again. It seems unrelated to eating, time of day, position or bowel movement. She was seen by her gynecologist a few weeks ago, it looks like a pelvic ultrasound was done that was unremarkable. She was told this was probably IBS. She has since been back in the ED for an acute exacerbation of his pain that was bandlike across the whole lower abdomen. A pelvic exam by the ED physician found cervical motion tenderness and some bloody cervical discharge. They wanted to treat her empirically for an STD, but the patient declined. 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 She had been checked on previous visits, and the patient reports that she was treated for an active gonorrhea infection contracted from her husband within the last 2 years. She her husband is still actively trying to conceive but have been unsuccessful, and she is no longer on Clomid.  ROS: Cardiovascular:  no chest pain Respiratory: no dyspnea  The patient's Past Medical, Family and Social History were reviewed and are on file in the EMR.  Objective:  Med list reviewed  Vital signs  in last 24 hrs: Vitals:   04/01/16 1550  BP: 120/70  Pulse: 89    Physical Exam    HEENT: sclera anicteric, oral mucosa moist without lesions  Neck: supple, no thyromegaly, JVD or lymphadenopathy  Cardiac: RRR without murmurs, S1S2 heard, no peripheral edema  Pulm: clear to auscultation bilaterally, normal RR and effort noted  Abdomen: soft, Obese, moderate LLQ tenderness, with active bowel sounds. No guarding or palpable hepatosplenomegaly.  Skin; warm and dry, no jaundice or rash  Recent Labs:  See ED visit labs  Radiologic studies: Recent pelvic ultrasound or gynecologist office negative   @ASSESSMENTPLANBEGIN @ Assessment: Encounter Diagnosis  Name Primary?  Marland Kitchen LLQ pain Yes   This is starting to sound less and less like IBS. Her bowel habits are now regulated, her pain is worrisome for chronic gynecologic causes such as PID or scar tissue. She recalls being told many years ago she could not conceive because of "scar tissue". She was ultimately able to have a child about 7 years ago and underwent C-section. She required surgery for a pelvic lesion that they were concerned might be malignant. She says it turned out to be just scar tissue.   Plan:  A trial of hyoscyamine in case this might be IBS CT scan abdomen and pelvis to make sure there is no other chronic structural or inflammatory cause. If the meds are not helpful in the CT is normal, she must return to gynecology for consideration of  laparoscopy.  Total time 30 minutes, over half spent in counseling and coordination of care.   Shellia CarwinHenry L Danis III   Charles Harper, MD

## 2016-04-01 NOTE — Patient Instructions (Signed)
If you are age 35 or older, your body mass index should be between 23-30. Your Body mass index is 42.4 kg/m. If this is out of the aforementioned range listed, please consider follow up with your Primary Care Provider.  If you are age 89 or younger, your body mass index should be between 19-25. Your Body mass index is 42.4 kg/m. If this is out of the aformentioned range listed, please consider follow up with your Primary Care Provider.   Your physician has requested that you go to the basement for the following lab work before leaving today: UA   You have been scheduled for a CT scan of the abdomen and pelvis at [facility] ([address]---this is in the same building as Press [facility]).   You are scheduled on 04-04-2016 at 3pm. You should arrive 15 minutes prior to your appointment time for registration. Please follow the written instructions below on the day of your exam:  WARNING: IF YOU ARE ALLERGIC TO IODINE/X-RAY DYE, PLEASE NOTIFY RADIOLOGY IMMEDIATELY AT [phone]! YOU WILL BE GIVEN A 13 HOUR PREMEDICATION PREP.  1) Do not eat or drink anything after 11am (4 hours prior to your test) 2) You have been given 2 bottles of oral contrast to drink. The solution may taste better if refrigerated, but do NOT add ice or any other liquid to this solution. Shake well before drinking.    Drink 1 bottle of contrast @ 1pm (2 hours prior to your exam)  Drink 1 bottle of contrast @ 2pm (1 hour prior to your exam)  You may take any medications as prescribed with a small amount of water except for the following: Metformin, Glucophage, Glucovance, Avandamet, Riomet, Fortamet, Actoplus Met, Janumet, Glumetza or Metaglip. The above medications must be held the day of the exam AND 48 hours after the exam.  The purpose of you drinking the oral contrast is to aid in the visualization of your intestinal tract. The contrast solution may cause some diarrhea. Before your exam is started, you  will be given a small amount of fluid to drink. Depending on your individual set of symptoms, you may also receive an intravenous injection of x-ray contrast/dye. Plan on being at [facility] for 30 minutes or longer, depending on the type of exam you are having performed.  This test typically takes 30-45 minutes to complete.  If you have any questions regarding your exam or if you need to reschedule, you may call the CT department at [phone] between the hours of 8:00 am and 5:00 pm, Monday-Friday.  ________________________________________________________________________  Thank you for choosing [facility]  Dr Wilfrid Lund III

## 2016-04-02 LAB — PREGNANCY, URINE: PREG TEST UR: NEGATIVE

## 2016-04-04 ENCOUNTER — Telehealth: Payer: Self-pay

## 2016-04-04 ENCOUNTER — Ambulatory Visit (INDEPENDENT_AMBULATORY_CARE_PROVIDER_SITE_OTHER)
Admission: RE | Admit: 2016-04-04 | Discharge: 2016-04-04 | Disposition: A | Discharge status: Home / Self Care | Payer: Medicaid Other | Source: Ambulatory Visit | Attending: Gastroenterology | Admitting: Gastroenterology

## 2016-04-04 DIAGNOSIS — R1032 Left lower quadrant pain: Secondary | ICD-10-CM

## 2016-04-04 MED ORDER — IOPAMIDOL (ISOVUE-300) INJECTION 61%
100.0000 mL | Freq: Once | INTRAVENOUS | Status: AC | PRN
  Administered 2016-04-04: 100 mL via INTRAVENOUS

## 2016-04-04 NOTE — Telephone Encounter (Signed)
Dr. Ruffin FrederickPop, radiologist, called to give you a heads up on patient's CT scan. Questionable nodule in pelvic area, said that if her pain was cyclical then most likely endometrium. Report is ready for your review. Thanks.

## 2016-04-13 ENCOUNTER — Other Ambulatory Visit: Payer: Self-pay | Admitting: Obstetrics

## 2016-04-13 DIAGNOSIS — E669 Obesity, unspecified: Secondary | ICD-10-CM

## 2016-04-17 ENCOUNTER — Ambulatory Visit (INDEPENDENT_AMBULATORY_CARE_PROVIDER_SITE_OTHER): Payer: Medicaid Other | Admitting: Obstetrics

## 2016-04-17 ENCOUNTER — Encounter: Payer: Self-pay | Admitting: Obstetrics

## 2016-04-17 VITALS — BP 139/82 | HR 97 | Ht 64.0 in | Wt 252.0 lb

## 2016-04-17 DIAGNOSIS — R102 Pelvic and perineal pain: Secondary | ICD-10-CM | POA: Diagnosis not present

## 2016-04-17 MED ORDER — OXYCODONE-ACETAMINOPHEN 10-325 MG PO TABS: 1.0000 | ORAL_TABLET | ORAL | 0 refills | Status: DC | PRN

## 2016-04-17 NOTE — Progress Notes (Addendum)
Ellen Booker is a 36 y.o.who presents for evaluation of pelvic pain. The pain is described as sharp and stabbing, and is 10/10 in intensity. Pain is located in the LLQ area without radiation. Onset was sudden occurring a few  hours  ago. Symptoms have been gradually worsening since. Aggravating factors: none. Alleviating factors: narcotic analgesics. Associated symptoms: none. The patient denies constipation, diarrhea and dysuria. Risk factors for pelvic/abdominal pain include prior pelvic surgery.  Menstrual History: OB History   Grav Para Term Preterm Abortions TAB SAB Ect Mult Living   0 0 0 0 0 0 0 0 0 0        Patient's last menstrual period was 01 /22 /2018    Past Medical History:  Diagnosis Date   Helicobacter pylori (H. pylori)     Past Surgical History:  Procedure Laterality Date   c-section     UTERINE FIBROID SURGERY       Current Outpatient Prescriptions:    buPROPion (WELLBUTRIN XL) 150 MG 24 hr tablet, take 1 tablet by mouth once daily for anxiety or depression, Disp: , Rfl: 0   ibuprofen (ADVIL,MOTRIN) 800 MG tablet, Take 1 tablet (800 mg total) by mouth every 8 (eight) hours as needed. (Patient taking differently: Take 800 mg by mouth every 8 (eight) hours as needed for headache, mild pain or moderate pain. ), Disp: 30 tablet, Rfl: 5   lamoTRIgine (LAMICTAL) 25 MG tablet, take 1 tablet by mouth daily for 2 weeks then INCREASE TO 2 TABS DAILY, Disp: , Rfl: 0   metFORMIN (GLUCOPHAGE-XR) 500 MG 24 hr tablet, take 1 tablet by mouth once daily WITH BREAKFAST, Disp: 120 tablet, Rfl: 11   metroNIDAZOLE (FLAGYL) 500 MG tablet, Take 500 mg by mouth 2 (two) times daily., Disp: , Rfl:    Prenat-FeCbn-FeAspGl-FA-Omega (OB COMPLETE PETITE) 35-5-1-200 MG CAPS, Take 1 capsule by mouth daily before breakfast., Disp: 30 capsule, Rfl: 11   vitamin B-12 (CYANOCOBALAMIN) 1000 MCG tablet, Take 1,000 mcg by mouth every morning., Disp: , Rfl:    hyoscyamine (LEVSIN SL) 0.125 MG SL tablet,  Place 1 tablet (0.125 mg total) under the tongue every 6 (six) hours as needed. (Patient not taking: Reported on 04/17/2016), Disp: 30 tablet, Rfl: 0   Linaclotide (LINZESS) 145 MCG CAPS capsule, Take 1 capsule (145 mcg total) by mouth daily before breakfast. (Patient not taking: Reported on 04/01/2016), Disp: 30 capsule, Rfl: 0   oxyCODONE-acetaminophen (PERCOCET) 10-325 MG tablet, Take 1 tablet by mouth every 4 (four) hours as needed for pain., Disp: 30 tablet, Rfl: 0 No Known Allergies  Social History  Substance Use Topics   Smoking status: Current Every Day Smoker    Packs/day: 0.50    Years: 15.00    Types: Cigarettes   Smokeless tobacco: Never Used   Alcohol use No    Family History  Problem Relation Age of Onset   HIV Father    Uterine cancer         Review of Systems Constitutional: negative for fatigue and weight loss Respiratory: negative for cough and wheezing Cardiovascular: negative for chest pain, fatigue and palpitations Gastrointestinal: negative for abdominal pain and change in bowel habits Genitourinary:positive for pelvic pain Integument/breast: negative for nipple discharge Musculoskeletal:negative for myalgias Neurological: negative for gait problems and tremors Behavioral/Psych: negative for abusive relationship, depression Endocrine: negative for temperature intolerance         Objective:  Physical Examination:  BP 139/82   Pulse 97   Ht 5\' 4"  (1.626  m)   Wt 252 lb (114.3 kg)   LMP 04/15/2016   BMI 43.26 kg/m  General:   Alert and no distress  Skin:   no rash or abnormalities  Lungs:   clear to auscultation bilaterally  Heart:   regular rate and rhythm, S1, S2 normal, no murmur, click, rub or gallop  Breasts: (  normal without suspicious masses, skin or nipple changes or axillary nodes  Abdomen:  normal findings: no organomegaly, soft, non-tender and no hernia  Pelvis:  External genitalia: normal general appearance Urinary system: urethral meatus  normal and bladder without fullness, nontender Vaginal: normal without tenderness, induration or masses Cervix: normal appearance Adnexa: tenderness left inguinal area.  No masses appreciated Uterus: anteverted and non-tender, normal size    Lab Review Labs: Urine pregnancy test, GC/Chlamydia DNA probe of cervico/vaginal secretions, Genital culture and Wet mount of vaginal secretions all negative   Imaging Ultrasound:  WNL's ( see report ) Ultrasound - Pelvic Vaginal, CT - Abdominal with contrast, CT - Pelvis ( see reports ) CT Abdomen Pelvis W Contrast (Accession [id number]) (Order [id number])  Imaging  Date: 04/04/2016 [facility] [address] Released By: Vira Agarishara R Thomas, NT Authorizing: Sherrilyn RistHenry L Danis III, MD  Exam Information   Status Exam Begun  Exam Ended   Final [99] 04/04/2016  3:11 PM 04/04/2016  3:24 PM  PACS Images   Show images for CT Abdomen Pelvis W Contrast  Study Result   CLINICAL DATA:  Left lower quadrant pain, mid lower pelvic pain for about 6 months, abdominal bloating   EXAM: CT ABDOMEN AND PELVIS WITH CONTRAST   TECHNIQUE: Multidetector CT imaging of the abdomen and pelvis was performed using the standard protocol following bolus administration of intravenous contrast.   CONTRAST:  100mL ISOVUE-300 IOPAMIDOL (ISOVUE-300) INJECTION 61%   COMPARISON:  None.   FINDINGS: Lower chest: Lung bases shows no acute findings.   Hepatobiliary: No focal liver abnormality is seen. No gallstones, gallbladder wall thickening, or biliary dilatation.   Pancreas: Unremarkable. No pancreatic ductal dilatation or surrounding inflammatory changes.   Spleen: Normal in size without focal abnormality.   Adrenals/Urinary Tract: No adrenal gland mass. Enhanced kidneys are symmetrical in size. There is a cyst in upper pole of the right kidney measures 4.5 cm. No hydronephrosis or hydroureter.   The urinary bladder is under distended grossly  unremarkable.   Stomach/Bowel: There is no small bowel obstruction. No gastric outlet obstruction. Oral contrast material was given to the patient.   No thickened or dilated small bowel loops. Normal appendix is noted in axial image 64. No pericecal inflammation. The terminal ileum is unremarkable. No distal colitis or diverticulitis. No distal colonic obstruction.   Vascular/Lymphatic: No retroperitoneal or mesenteric adenopathy. Abdominal aorta is unremarkable. No aortic aneurysm.   Reproductive: The uterus is anteflexed normal size. The right ovary is normal. There is a follicle within left ovary measures 2 cm.   Other: No ascites or free abdominal air.   In axial image 65 there is somewhat irregular soft tissue nodule in left anterior deep pelvic wall just medial/ above the left rectus muscle measures about 3.2 cm. This is best seen in sagittal image 73. There is mild stranding of adjacent subcutaneous fat. This is suspicious for ectopic endometrioma, cheloid or desmoid tumor. Clinical correlation is necessary. Further correlation with tissue biopsy is suggested as clinically warranted.   Musculoskeletal: No destructive bony lesions are noted. Sagittal images of the spine shows mild degenerative  changes lower thoracic spine. No inguinal adenopathy.   IMPRESSION: 1. In axial image 65 there is somewhat irregular soft tissue nodule in left anterior deep pelvic wall just medial/ above the left rectus muscle measures about 3.2 cm. This is best seen in sagittal image 73. There is mild stranding of adjacent subcutaneous fat. This is suspicious for ectopic endometrioma, cheloid or desmoid tumor. Clinical correlation is necessary. Further correlation with tissue biopsy is suggested as clinically warranted. 2. Normal appendix.  No pericecal inflammation. 3. There is a cyst in upper pole the right kidney measures 4.5 cm. 4. No small bowel or colonic obstruction. 5. Normal anteflexed  uterus. There is a follicle within left ovary measures 2 cm. Normal right ovary. No pelvic adenopathy.     Electronically Signed   By: Natasha Mead M.D.   On: 04/04/2016 15:46      Pelvic pain assessment form reviewed  50% of 20 min visit spent on counseling and coordination of care.    Assessment:    Pelvic pain.  Questionable etiology.   Plan:     Referred to Gyn Oncology for review and management     No orders of the defined types were placed in this encounter.  Meds ordered this encounter  Medications   lamoTRIgine (LAMICTAL) 25 MG tablet    Sig: take 1 tablet by mouth daily for 2 weeks then INCREASE TO 2 TABS DAILY    Refill:  0   buPROPion (WELLBUTRIN XL) 150 MG 24 hr tablet    Sig: take 1 tablet by mouth once daily for anxiety or depression    Refill:  0   oxyCODONE-acetaminophen (PERCOCET) 10-325 MG tablet    Sig: Take 1 tablet by mouth every 4 (four) hours as needed for pain.    Dispense:  30 tablet    Refill:  0

## 2016-04-17 NOTE — Progress Notes (Signed)
Pt presents for f/u after u/s performed 04/04/16 d/t LLQ/pelvic pain and tenderness.

## 2016-04-18 ENCOUNTER — Encounter: Payer: Self-pay | Admitting: Gynecologic Oncology

## 2016-04-18 NOTE — Progress Notes (Signed)
Dr. Andrey Farmerossi reviewed the CT images on 04/17/16 and recommended referral to General Surgery.  Dr. Clearance CootsHarper notified of these recommendations.  [facility] for Dr. Clearance CootsHarper at [phone].

## 2016-05-10 ENCOUNTER — Ambulatory Visit: Payer: Self-pay | Admitting: General Surgery

## 2016-05-14 ENCOUNTER — Encounter (HOSPITAL_BASED_OUTPATIENT_CLINIC_OR_DEPARTMENT_OTHER): Payer: Self-pay | Admitting: *Deleted

## 2016-05-14 NOTE — Progress Notes (Addendum)
NPO AFTER MN.  ARRIVE AT 1045.  NEEDS HG AND URINE PREG.  WILL TAKE LAMICTAL AND PROZAC AM DOS W/ SIPS OF WATER AND IF NEEDED TAKE OXYCODONE. PT VERBALIZED UNDERSTANDING TO DO HIBICLENS SHOWERS HS BEFORE AND AM DOS.  PT STATED THAT FOR PAIN RELIEVE SHE HAS TO TAKE OXYCODONE AND 800 MG IBUPROFEN TOGETHER.  ADVISED PT TO CALL OFFICE AND SPEAK TO DR Sheliah HatchKINSINGER NURSE FOR RECOMMENDATION SINCE SHE IS STOP IBUPROFEN PRIOR TO SURGERY.

## 2016-05-15 ENCOUNTER — Other Ambulatory Visit: Payer: Self-pay

## 2016-05-15 DIAGNOSIS — R102 Pelvic and perineal pain: Secondary | ICD-10-CM

## 2016-05-15 NOTE — Telephone Encounter (Signed)
Patient called in, requesting refill on Percocet until her surgery on 05-21-16, advised that I would route to provider for further review and follow up.

## 2016-05-16 MED ORDER — OXYCODONE-ACETAMINOPHEN 10-325 MG PO TABS: 1.0000 | ORAL_TABLET | ORAL | 0 refills | Status: DC | PRN

## 2016-05-17 ENCOUNTER — Telehealth: Payer: Self-pay

## 2016-05-17 NOTE — Telephone Encounter (Signed)
Attempted to contact about rx sent, no answer, left vm to call.

## 2016-05-20 ENCOUNTER — Telehealth: Payer: Self-pay

## 2016-05-20 NOTE — Telephone Encounter (Signed)
Advised patient that rx is here in office for pick up.

## 2016-05-21 ENCOUNTER — Encounter (HOSPITAL_BASED_OUTPATIENT_CLINIC_OR_DEPARTMENT_OTHER): Payer: Self-pay | Admitting: Anesthesiology

## 2016-05-21 ENCOUNTER — Encounter (HOSPITAL_BASED_OUTPATIENT_CLINIC_OR_DEPARTMENT_OTHER)
Admission: RE | Disposition: A | Discharge status: Home / Self Care | Payer: Self-pay | Source: Ambulatory Visit | Attending: General Surgery

## 2016-05-21 ENCOUNTER — Ambulatory Visit (HOSPITAL_BASED_OUTPATIENT_CLINIC_OR_DEPARTMENT_OTHER): Payer: Medicaid Other | Admitting: Anesthesiology

## 2016-05-21 ENCOUNTER — Ambulatory Visit (HOSPITAL_BASED_OUTPATIENT_CLINIC_OR_DEPARTMENT_OTHER)
Admission: RE | Admit: 2016-05-21 | Discharge: 2016-05-21 | Disposition: A | Discharge status: Home / Self Care | Payer: Medicaid Other | Source: Ambulatory Visit | Attending: General Surgery | Admitting: General Surgery

## 2016-05-21 DIAGNOSIS — Z6841 Body Mass Index (BMI) 40.0 and over, adult: Secondary | ICD-10-CM | POA: Insufficient documentation

## 2016-05-21 DIAGNOSIS — F329 Major depressive disorder, single episode, unspecified: Secondary | ICD-10-CM | POA: Insufficient documentation

## 2016-05-21 DIAGNOSIS — N808 Other endometriosis: Secondary | ICD-10-CM | POA: Insufficient documentation

## 2016-05-21 DIAGNOSIS — Z79899 Other long term (current) drug therapy: Secondary | ICD-10-CM | POA: Diagnosis not present

## 2016-05-21 DIAGNOSIS — I10 Essential (primary) hypertension: Secondary | ICD-10-CM | POA: Insufficient documentation

## 2016-05-21 DIAGNOSIS — R1900 Intra-abdominal and pelvic swelling, mass and lump, unspecified site: Secondary | ICD-10-CM | POA: Diagnosis present

## 2016-05-21 DIAGNOSIS — Z7984 Long term (current) use of oral hypoglycemic drugs: Secondary | ICD-10-CM | POA: Insufficient documentation

## 2016-05-21 DIAGNOSIS — F17210 Nicotine dependence, cigarettes, uncomplicated: Secondary | ICD-10-CM | POA: Diagnosis not present

## 2016-05-21 HISTORY — DX: Personal history of other infectious and parasitic diseases: Z86.19

## 2016-05-21 HISTORY — DX: Localized swelling, mass and lump, trunk: R22.2

## 2016-05-21 HISTORY — DX: Depression, unspecified: F32.A

## 2016-05-21 HISTORY — PX: EXCISION MASS ABDOMINAL: SHX6701

## 2016-05-21 HISTORY — DX: Presence of spectacles and contact lenses: Z97.3

## 2016-05-21 HISTORY — DX: Cyst of kidney, acquired: N28.1

## 2016-05-21 HISTORY — DX: Major depressive disorder, single episode, unspecified: F32.9

## 2016-05-21 LAB — POCT HEMOGLOBIN-HEMACUE: HEMOGLOBIN: 12.8 g/dL (ref 12.0–15.0)

## 2016-05-21 LAB — POCT PREGNANCY, URINE: Preg Test, Ur: NEGATIVE

## 2016-05-21 SURGERY — EXCISION, MASS, TORSO
Anesthesia: General | Site: Abdomen

## 2016-05-21 MED ORDER — HYDROCODONE-ACETAMINOPHEN 5-325 MG PO TABS: 1.0000 | ORAL_TABLET | Freq: Four times a day (QID) | ORAL | 0 refills | Status: DC | PRN

## 2016-05-21 MED ORDER — MIDAZOLAM HCL 5 MG/5ML IJ SOLN
INTRAMUSCULAR | Status: DC | PRN
  Administered 2016-05-21 (×2): 1 mg via INTRAVENOUS

## 2016-05-21 MED ORDER — HYDROMORPHONE HCL 1 MG/ML IJ SOLN
0.2500 mg | INTRAMUSCULAR | Status: DC | PRN
  Administered 2016-05-21: 0.5 mg via INTRAVENOUS
  Filled 2016-05-21: qty 0.5

## 2016-05-21 MED ORDER — SUCCINYLCHOLINE CHLORIDE 20 MG/ML IJ SOLN
INTRAMUSCULAR | Status: DC | PRN
  Administered 2016-05-21: 80 mg via INTRAVENOUS

## 2016-05-21 MED ORDER — ROCURONIUM BROMIDE 100 MG/10ML IV SOLN
INTRAVENOUS | Status: DC | PRN
  Administered 2016-05-21: 30 mg via INTRAVENOUS
  Administered 2016-05-21: 20 mg via INTRAVENOUS

## 2016-05-21 MED ORDER — PROMETHAZINE HCL 25 MG/ML IJ SOLN
6.2500 mg | INTRAMUSCULAR | Status: DC | PRN
  Filled 2016-05-21: qty 1

## 2016-05-21 MED ORDER — GABAPENTIN 300 MG PO CAPS
ORAL_CAPSULE | ORAL | Status: AC
  Filled 2016-05-21: qty 1

## 2016-05-21 MED ORDER — PROPOFOL 10 MG/ML IV BOLUS
INTRAVENOUS | Status: AC
  Filled 2016-05-21: qty 20

## 2016-05-21 MED ORDER — LIDOCAINE HCL (CARDIAC) 20 MG/ML IV SOLN
INTRAVENOUS | Status: DC | PRN
  Administered 2016-05-21: 100 mg via INTRAVENOUS

## 2016-05-21 MED ORDER — ROCURONIUM BROMIDE 50 MG/5ML IV SOSY
PREFILLED_SYRINGE | INTRAVENOUS | Status: AC
  Filled 2016-05-21: qty 5

## 2016-05-21 MED ORDER — BUPIVACAINE-EPINEPHRINE 0.5% -1:200000 IJ SOLN
INTRAMUSCULAR | Status: DC | PRN
  Administered 2016-05-21: 20 mL

## 2016-05-21 MED ORDER — CEFAZOLIN SODIUM-DEXTROSE 2-4 GM/100ML-% IV SOLN
INTRAVENOUS | Status: AC
  Filled 2016-05-21: qty 100

## 2016-05-21 MED ORDER — FENTANYL CITRATE (PF) 100 MCG/2ML IJ SOLN
INTRAMUSCULAR | Status: AC
  Filled 2016-05-21: qty 2

## 2016-05-21 MED ORDER — SUCCINYLCHOLINE CHLORIDE 200 MG/10ML IV SOSY
PREFILLED_SYRINGE | INTRAVENOUS | Status: AC
  Filled 2016-05-21: qty 10

## 2016-05-21 MED ORDER — KETOROLAC TROMETHAMINE 30 MG/ML IJ SOLN
30.0000 mg | Freq: Once | INTRAMUSCULAR | Status: DC | PRN
  Filled 2016-05-21: qty 1

## 2016-05-21 MED ORDER — PROPOFOL 10 MG/ML IV BOLUS
INTRAVENOUS | Status: AC
  Filled 2016-05-21: qty 40

## 2016-05-21 MED ORDER — DEXAMETHASONE SODIUM PHOSPHATE 4 MG/ML IJ SOLN
INTRAMUSCULAR | Status: DC | PRN
  Administered 2016-05-21: 10 mg via INTRAVENOUS

## 2016-05-21 MED ORDER — CELECOXIB 200 MG PO CAPS
ORAL_CAPSULE | ORAL | Status: AC
  Filled 2016-05-21: qty 2

## 2016-05-21 MED ORDER — KETOROLAC TROMETHAMINE 30 MG/ML IJ SOLN
INTRAMUSCULAR | Status: DC | PRN
  Administered 2016-05-21: 30 mg via INTRAVENOUS

## 2016-05-21 MED ORDER — PROPOFOL 10 MG/ML IV BOLUS
INTRAVENOUS | Status: DC | PRN
  Administered 2016-05-21: 50 mg via INTRAVENOUS
  Administered 2016-05-21: 70 mg via INTRAVENOUS
  Administered 2016-05-21: 50 mg via INTRAVENOUS
  Administered 2016-05-21: 230 mg via INTRAVENOUS

## 2016-05-21 MED ORDER — CHLORHEXIDINE GLUCONATE CLOTH 2 % EX PADS
6.0000 | MEDICATED_PAD | Freq: Once | CUTANEOUS | Status: DC
  Filled 2016-05-21: qty 6

## 2016-05-21 MED ORDER — MIDAZOLAM HCL 2 MG/2ML IJ SOLN
INTRAMUSCULAR | Status: AC
  Filled 2016-05-21: qty 2

## 2016-05-21 MED ORDER — ONDANSETRON HCL 4 MG/2ML IJ SOLN
INTRAMUSCULAR | Status: AC
  Filled 2016-05-21: qty 2

## 2016-05-21 MED ORDER — CELECOXIB 400 MG PO CAPS
400.0000 mg | ORAL_CAPSULE | ORAL | Status: AC
  Administered 2016-05-21: 400 mg via ORAL
  Filled 2016-05-21: qty 1

## 2016-05-21 MED ORDER — FENTANYL CITRATE (PF) 100 MCG/2ML IJ SOLN
INTRAMUSCULAR | Status: DC | PRN
  Administered 2016-05-21 (×4): 25 ug via INTRAVENOUS
  Administered 2016-05-21: 50 ug via INTRAVENOUS
  Administered 2016-05-21 (×2): 25 ug via INTRAVENOUS

## 2016-05-21 MED ORDER — METOCLOPRAMIDE HCL 5 MG/ML IJ SOLN
INTRAMUSCULAR | Status: AC
  Filled 2016-05-21: qty 2

## 2016-05-21 MED ORDER — ONDANSETRON HCL 4 MG/2ML IJ SOLN
INTRAMUSCULAR | Status: DC | PRN
  Administered 2016-05-21: 4 mg via INTRAVENOUS

## 2016-05-21 MED ORDER — CEFAZOLIN SODIUM-DEXTROSE 2-4 GM/100ML-% IV SOLN
2.0000 g | INTRAVENOUS | Status: AC
  Administered 2016-05-21: 2 g via INTRAVENOUS
  Filled 2016-05-21: qty 100

## 2016-05-21 MED ORDER — EPHEDRINE SULFATE 50 MG/ML IJ SOLN
INTRAMUSCULAR | Status: DC | PRN
  Administered 2016-05-21: 10 mg via INTRAVENOUS

## 2016-05-21 MED ORDER — ACETAMINOPHEN 500 MG PO TABS
ORAL_TABLET | ORAL | Status: AC
  Filled 2016-05-21: qty 2

## 2016-05-21 MED ORDER — DEXAMETHASONE SODIUM PHOSPHATE 10 MG/ML IJ SOLN
INTRAMUSCULAR | Status: AC
  Filled 2016-05-21: qty 1

## 2016-05-21 MED ORDER — SUGAMMADEX SODIUM 200 MG/2ML IV SOLN
INTRAVENOUS | Status: DC | PRN
  Administered 2016-05-21: 200 mg via INTRAVENOUS

## 2016-05-21 MED ORDER — METOCLOPRAMIDE HCL 5 MG/ML IJ SOLN
INTRAMUSCULAR | Status: DC | PRN
  Administered 2016-05-21: 10 mg via INTRAVENOUS

## 2016-05-21 MED ORDER — ACETAMINOPHEN 500 MG PO TABS
1000.0000 mg | ORAL_TABLET | ORAL | Status: AC
  Administered 2016-05-21: 1000 mg via ORAL
  Filled 2016-05-21: qty 2

## 2016-05-21 MED ORDER — HYDROMORPHONE HCL 2 MG/ML IJ SOLN
INTRAMUSCULAR | Status: AC
  Filled 2016-05-21: qty 1

## 2016-05-21 MED ORDER — GABAPENTIN 300 MG PO CAPS
300.0000 mg | ORAL_CAPSULE | ORAL | Status: AC
  Administered 2016-05-21: 300 mg via ORAL
  Filled 2016-05-21: qty 1

## 2016-05-21 MED ORDER — LACTATED RINGERS IV SOLN
INTRAVENOUS | Status: DC
  Administered 2016-05-21 (×2): via INTRAVENOUS
  Filled 2016-05-21: qty 1000

## 2016-05-21 MED ORDER — IBUPROFEN 800 MG PO TABS
800.0000 mg | ORAL_TABLET | Freq: Three times a day (TID) | ORAL | 0 refills | Status: DC | PRN
Start: 2016-05-21 — End: 2016-06-18

## 2016-05-21 SURGICAL SUPPLY — 48 items
BLADE CLIPPER SURG (BLADE) IMPLANT
BLADE HEX COATED 2.75 (ELECTRODE) ×3 IMPLANT
BLADE SURG 15 STRL LF DISP TIS (BLADE) ×1 IMPLANT
BLADE SURG 15 STRL SS (BLADE) ×2
BNDG GAUZE ELAST 4 BULKY (GAUZE/BANDAGES/DRESSINGS) IMPLANT
CANISTER SUCT 1200ML W/VALVE (MISCELLANEOUS) ×3 IMPLANT
CHLORAPREP W/TINT 26ML (MISCELLANEOUS) ×3 IMPLANT
CLOSURE WOUND 1/2 X4 (GAUZE/BANDAGES/DRESSINGS) ×2
COVER BACK TABLE 60X90IN (DRAPES) ×3 IMPLANT
COVER MAYO STAND STRL (DRAPES) ×3 IMPLANT
DECANTER SPIKE VIAL GLASS SM (MISCELLANEOUS) IMPLANT
DRAPE LAPAROTOMY 100X72 PEDS (DRAPES) ×3 IMPLANT
DRAPE UTILITY XL STRL (DRAPES) ×3 IMPLANT
DRSG TEGADERM 4X4.75 (GAUZE/BANDAGES/DRESSINGS) IMPLANT
ELECT REM PT RETURN 9FT ADLT (ELECTROSURGICAL) ×3
ELECTRODE REM PT RTRN 9FT ADLT (ELECTROSURGICAL) ×1 IMPLANT
GAUZE SPONGE 4X4 12PLY STRL (GAUZE/BANDAGES/DRESSINGS) ×3 IMPLANT
GLOVE BIOGEL PI IND STRL 7.0 (GLOVE) ×1 IMPLANT
GLOVE BIOGEL PI INDICATOR 7.0 (GLOVE) ×2
GLOVE SURG SS PI 7.0 STRL IVOR (GLOVE) ×3 IMPLANT
GOWN STRL REUS W/ TWL LRG LVL3 (GOWN DISPOSABLE) ×1 IMPLANT
GOWN STRL REUS W/TWL LRG LVL3 (GOWN DISPOSABLE) ×2
KIT RM TURNOVER CYSTO AR (KITS) ×3 IMPLANT
NEEDLE HYPO 25X1 1.5 SAFETY (NEEDLE) ×3 IMPLANT
NS IRRIG 500ML POUR BTL (IV SOLUTION) ×3 IMPLANT
PACK BASIN DAY SURGERY FS (CUSTOM PROCEDURE TRAY) ×3 IMPLANT
PENCIL BUTTON HOLSTER BLD 10FT (ELECTRODE) ×3 IMPLANT
SLEEVE SCD COMPRESS KNEE MED (MISCELLANEOUS) ×3 IMPLANT
SPONGE GAUZE 4X4 12PLY STER LF (GAUZE/BANDAGES/DRESSINGS) IMPLANT
SPONGE LAP 18X18 X RAY DECT (DISPOSABLE) ×3 IMPLANT
SPONGE LAP 4X18 X RAY DECT (DISPOSABLE) IMPLANT
STRIP CLOSURE SKIN 1/2X4 (GAUZE/BANDAGES/DRESSINGS) ×4 IMPLANT
SUCTION FRAZIER TIP 10 FR DISP (SUCTIONS) IMPLANT
SUT MNCRL AB 4-0 PS2 18 (SUTURE) IMPLANT
SUT SILK 3 0 TIES 17X18 (SUTURE)
SUT SILK 3-0 18XBRD TIE BLK (SUTURE) IMPLANT
SUT VIC AB 2-0 SH 27 (SUTURE)
SUT VIC AB 2-0 SH 27XBRD (SUTURE) IMPLANT
SUT VIC AB 3-0 SH 27 (SUTURE)
SUT VIC AB 3-0 SH 27X BRD (SUTURE) IMPLANT
SWAB CULTURE ESWAB REG 1ML (MISCELLANEOUS) IMPLANT
SWAB CULTURE LIQ STUART DBL (MISCELLANEOUS) IMPLANT
SYR CONTROL 10ML LL (SYRINGE) ×3 IMPLANT
TAPE HYPAFIX 4 X10 (GAUZE/BANDAGES/DRESSINGS) ×3 IMPLANT
TOWEL OR 17X24 6PK STRL BLUE (TOWEL DISPOSABLE) ×3 IMPLANT
TUBE CONNECTING 12'X1/4 (SUCTIONS)
TUBE CONNECTING 12X1/4 (SUCTIONS) IMPLANT
YANKAUER SUCT BULB TIP NO VENT (SUCTIONS) ×3 IMPLANT

## 2016-05-21 NOTE — H&P (Signed)
Ellen Booker is an 36 y.o. female.   Chief Complaint:  Abdominal mass HPI: 36 yo female with history of endometriosis who underwent open endometriosis resection years ago who presented with a mass along the mid abdominal wall. She has daily pain not related to periods and occasionally related to movement.  Past Medical History:  Diagnosis Date  . Abdominal wall mass    left side  . Depression   . History of Helicobacter pylori infection    per pt 2016  . Renal cyst, right    per CT 04-04-2016  . Wears glasses     Past Surgical History:  Procedure Laterality Date  . CESAREAN SECTION  2010  . COLONOSCOPY  last one 06-19-2015  . EXCISION ABDOMINAL WALL MASS  2012   right side    Family History  Problem Relation Age of Onset  . HIV Father   . Uterine cancer     Social History:  reports that she has been smoking Cigarettes.  She has a 9.50 pack-year smoking history. She has never used smokeless tobacco. She reports that she does not drink alcohol or use drugs.  Allergies: No Known Allergies  Medications Prior to Admission  Medication Sig Dispense Refill  . FLUoxetine (PROZAC) 10 MG tablet Take 10 mg by mouth every morning.    Marland Kitchen ibuprofen (ADVIL,MOTRIN) 800 MG tablet Take 1 tablet (800 mg total) by mouth every 8 (eight) hours as needed. (Patient taking differently: Take 800 mg by mouth every 8 (eight) hours as needed for headache, mild pain or moderate pain. ) 30 tablet 5  . lamoTRIgine (LAMICTAL) 25 MG tablet take 1 tablet by mouth daily for 2 weeks then INCREASE TO 2 TABS DAILY--  now takes 1 tablets in am and  2 tablets in pm  0  . metFORMIN (GLUCOPHAGE-XR) 500 MG 24 hr tablet take 1 tablet by mouth once daily WITH BREAKFAST (Patient taking differently: take 1 tablet by mouth once daily WITH BREAKFAST   (not taken for diabetes,  for inferlity)) 120 tablet 11  . metroNIDAZOLE (FLAGYL) 500 MG tablet Take 500 mg by mouth 2 (two) times daily as needed. Takes when taking antibiotic's     . Prenat-FeCbn-FeAspGl-FA-Omega (OB COMPLETE PETITE) 35-5-1-200 MG CAPS Take 1 capsule by mouth daily before breakfast. 30 capsule 11  . vitamin B-12 (CYANOCOBALAMIN) 1000 MCG tablet Take 1,000 mcg by mouth every morning.    Marland Kitchen oxyCODONE-acetaminophen (PERCOCET) 10-325 MG tablet Take 1 tablet by mouth every 4 (four) hours as needed for pain. 30 tablet 0    No results found for this or any previous visit (from the past 48 hour(s)). No results found.  Review of Systems  Constitutional: Negative for chills and fever.  HENT: Negative for hearing loss.   Eyes: Negative for blurred vision and double vision.  Respiratory: Negative for cough and hemoptysis.   Cardiovascular: Negative for chest pain and palpitations.  Gastrointestinal: Positive for abdominal pain. Negative for nausea and vomiting.  Genitourinary: Negative for dysuria and urgency.  Musculoskeletal: Negative for myalgias and neck pain.  Skin: Negative for itching and rash.  Neurological: Negative for dizziness, tingling and headaches.  Endo/Heme/Allergies: Does not bruise/bleed easily.  Psychiatric/Behavioral: Negative for depression and suicidal ideas.    Blood pressure (!) 148/78, pulse 81, temperature 98.5 F (36.9 C), temperature source Oral, resp. rate 16, height 5\' 4"  (1.626 m), weight 112.5 kg (248 lb), last menstrual period 05/14/2016, SpO2 96 %. Physical Exam  Vitals reviewed. Constitutional: She is  oriented to person, place, and time. She appears well-developed and well-nourished.  HENT:  Head: Normocephalic and atraumatic.  Eyes: Conjunctivae and EOM are normal. Pupils are equal, round, and reactive to light.  Neck: Normal range of motion. Neck supple.  Cardiovascular: Normal rate and regular rhythm.   Respiratory: Effort normal and breath sounds normal.  GI: Soft. Bowel sounds are normal. She exhibits no distension. There is no tenderness.  Lower abdominal firmness  Musculoskeletal: Normal range of motion.   Neurological: She is alert and oriented to person, place, and time.  Skin: Skin is warm and dry.  Psychiatric: She has a normal mood and affect. Her behavior is normal.     Assessment/Plan 36 yo female with abdominal wall mass -excision of mass -possible fascial resection with reconstruction   Rodman PickleLuke Aaron Kinsinger, MD 05/21/2016, 11:05 AM

## 2016-05-21 NOTE — Anesthesia Procedure Notes (Signed)
Procedure Name: Intubation Date/Time: 05/21/2016 12:18 PM Performed by: Justice Rocher Pre-anesthesia Checklist: Patient identified, Emergency Drugs available, Suction available and Patient being monitored Patient Re-evaluated:Patient Re-evaluated prior to inductionOxygen Delivery Method: Circle system utilized Preoxygenation: Pre-oxygenation with 100% oxygen Intubation Type: IV induction Ventilation: Mask ventilation without difficulty Laryngoscope Size: Mac and 4 Grade View: Grade II Tube type: Oral Number of attempts: 1 Airway Equipment and Method: Stylet and Oral airway Placement Confirmation: ETT inserted through vocal cords under direct vision,  positive ETCO2 and breath sounds checked- equal and bilateral Secured at: 23 cm Tube secured with: Tape Dental Injury: Teeth and Oropharynx as per pre-operative assessment

## 2016-05-21 NOTE — Anesthesia Preprocedure Evaluation (Signed)
Anesthesia Evaluation  Patient identified by MRN, date of birth, ID band Patient awake    Reviewed: Allergy & Precautions, NPO status , Patient's Chart, lab work & pertinent test results  Airway Mallampati: II  TM Distance: <3 FB Neck ROM: Full    Dental no notable dental hx.    Pulmonary Current Smoker,    Pulmonary exam normal breath sounds clear to auscultation       Cardiovascular hypertension, Normal cardiovascular exam Rhythm:Regular Rate:Normal     Neuro/Psych negative neurological ROS  negative psych ROS   GI/Hepatic negative GI ROS, Neg liver ROS,   Endo/Other  Morbid obesity  Renal/GU negative Renal ROS  negative genitourinary   Musculoskeletal negative musculoskeletal ROS (+)   Abdominal   Peds negative pediatric ROS (+)  Hematology negative hematology ROS (+)   Anesthesia Other Findings   Reproductive/Obstetrics negative OB ROS                             Anesthesia Physical Anesthesia Plan  ASA: III  Anesthesia Plan: General   Post-op Pain Management:    Induction: Intravenous  Airway Management Planned: LMA and Oral ETT  Additional Equipment:   Intra-op Plan:   Post-operative Plan: Extubation in OR  Informed Consent: I have reviewed the patients History and Physical, chart, labs and discussed the procedure including the risks, benefits and alternatives for the proposed anesthesia with the patient or authorized representative who has indicated his/her understanding and acceptance.   Dental advisory given  Plan Discussed with: CRNA and Surgeon  Anesthesia Plan Comments:         Anesthesia Quick Evaluation

## 2016-05-21 NOTE — Op Note (Signed)
Preoperative diagnosis: abdominal wall mass  Postoperative diagnosis: same   Procedure: excision of 4cm abdominal wall mass involving fascia and peritoneum with fascial reconstruction Surgeon: Feliciana RossettiLuke Kinsinger, M.D.  Asst: none  Anesthesia: general  Indications for procedure: Ellen GratesVanessa Jacobs is a 36 y.o. year old female with symptoms of abdominal pain and findings of 4cm mass on CT scan along the left rectus muscle.  Description of procedure: The patient was brought into the operative suite. Anesthesia was administered with General endotracheal anesthesia. WHO checklist was applied. The patient was then placed in supine position. The area was prepped and draped in the usual sterile fashion.  Next,  previous Pfannenstiel skin incision was reopened. Cautery was used to dissect down through subcutaneous tissues and tract superiorly. Masses palpated in the area. Generous margin was created in the anterior aspect around the mass. Further dissection was used to dissect towards the posterior aspect of the mass fascia was encountered and required resection of the anterior fascia directly underlying the mass. Dissection did enter into the peritoneal cavity a small portion of the peritoneum was removed in order to remove the mass en bloc. Mass then removed en bloc, short stitch was placed along the deep or posterior margin, long stitch was placed along the superior margin.  Next hemostasis was applied with cautery. A 3-0 Vicryl suture was then used to close the peritoneum in a transverse fashion. Next a 0 PDS was used in running fashion to close the fascia and transverse fashion. At this time irrigation was used to clean the wound as best possible. Hemostasis was reapplied to small area of the fat. 0.5% Marcaine with epinephrine was infiltrated into the area. 3-0 Vicryl was used to close the deep dermal space. Finally, 4-0 Monocryl was used to close skin septic or fashion. Steri-Strips and bandage put in place  for dressing. Patient tolerated procedure well was brought to PACU in stable condition. All counts were correct.  Findings: 3 cm firm mass involving anterior fascia and peritoneum in one area.  Specimen: Abdominal wall mass  Implant: None   Blood loss: 30 mL  Local anesthesia: 20ml 0.5% marcaine w epinephrine  Complications: none  Feliciana RossettiLuke Kinsinger, M.D. General, Bariatric, & Minimally Invasive Surgery [facility]

## 2016-05-21 NOTE — Transfer of Care (Signed)
Immediate Anesthesia Transfer of Care Note  Patient: Ellen Booker  Procedure(s) Performed: Procedure(s) (LRB): EXCISION ABDOMINAL WALL MASS (N/A)  Patient Location: PACU  Anesthesia Type: General  Level of Consciousness: awake, sedated, patient cooperative and responds to stimulation  Airway & Oxygen Therapy: Patient Spontanous Breathing and Patient connected to face mask oxygen  Post-op Assessment: Report given to PACU RN, Post -op Vital signs reviewed and stable and Patient moving all extremities  Post vital signs: Reviewed and stable  Complications: No apparent anesthesia complications

## 2016-05-21 NOTE — Discharge Instructions (Signed)

## 2016-05-22 ENCOUNTER — Encounter (HOSPITAL_BASED_OUTPATIENT_CLINIC_OR_DEPARTMENT_OTHER): Payer: Self-pay | Admitting: General Surgery

## 2016-05-22 NOTE — Anesthesia Postprocedure Evaluation (Signed)
Anesthesia Post Note  Patient: Ellen Booker  Procedure(s) Performed: Procedure(s) (LRB): EXCISION ABDOMINAL WALL MASS (N/A)  Patient location during evaluation: PACU Anesthesia Type: General Level of consciousness: sedated and patient cooperative Pain management: pain level controlled Vital Signs Assessment: post-procedure vital signs reviewed and stable Respiratory status: spontaneous breathing Cardiovascular status: stable Anesthetic complications: no       Last Vitals:  Vitals:   05/21/16 1430 05/21/16 1500  BP: 136/71 104/71  Pulse: 88 83  Resp: 17 16  Temp:  36.9 C    Last Pain:  Vitals:   05/21/16 1500  TempSrc:   PainSc: 2                  Lewie LoronJohn Krish Bailly

## 2016-05-25 ENCOUNTER — Telehealth: Payer: Self-pay | Admitting: General Surgery

## 2016-05-25 NOTE — Telephone Encounter (Signed)
She is s/p excision of 4cm abdominal wall mass involving fascia and peritoneum with fascial reconstruction 05/21/16 by Dr. Sheliah HatchKinsinger.  She called today requesting a refill on her pain medication.  I explained to her that it was our policy not to refill narcotic pain medication after office hours or on weekends.  I suggested she take 600 mg of Ibuprofen three times a day with meals, put an ice pack on the area, and call the office Monday morning to get a refill on her pain medication.

## 2016-06-17 ENCOUNTER — Emergency Department (HOSPITAL_COMMUNITY)
Admission: EM | Admit: 2016-06-17 | Discharge: 2016-06-18 | Disposition: A | Discharge status: Home / Self Care | Payer: Medicaid Other | Attending: Emergency Medicine | Admitting: Emergency Medicine

## 2016-06-17 ENCOUNTER — Emergency Department (HOSPITAL_COMMUNITY): Payer: Medicaid Other

## 2016-06-17 ENCOUNTER — Encounter (HOSPITAL_COMMUNITY): Payer: Self-pay | Admitting: Emergency Medicine

## 2016-06-17 DIAGNOSIS — F17210 Nicotine dependence, cigarettes, uncomplicated: Secondary | ICD-10-CM | POA: Diagnosis not present

## 2016-06-17 DIAGNOSIS — R188 Other ascites: Secondary | ICD-10-CM | POA: Diagnosis not present

## 2016-06-17 DIAGNOSIS — I10 Essential (primary) hypertension: Secondary | ICD-10-CM | POA: Insufficient documentation

## 2016-06-17 DIAGNOSIS — R102 Pelvic and perineal pain: Secondary | ICD-10-CM

## 2016-06-17 DIAGNOSIS — Z7984 Long term (current) use of oral hypoglycemic drugs: Secondary | ICD-10-CM | POA: Insufficient documentation

## 2016-06-17 DIAGNOSIS — R109 Unspecified abdominal pain: Secondary | ICD-10-CM | POA: Diagnosis present

## 2016-06-17 LAB — COMPREHENSIVE METABOLIC PANEL
ALK PHOS: 65 U/L (ref 38–126)
ALT: 14 U/L (ref 14–54)
ANION GAP: 4 — AB (ref 5–15)
AST: 15 U/L (ref 15–41)
Albumin: 3.7 g/dL (ref 3.5–5.0)
BILIRUBIN TOTAL: 0.4 mg/dL (ref 0.3–1.2)
BUN: 9 mg/dL (ref 6–20)
CALCIUM: 9.3 mg/dL (ref 8.9–10.3)
CO2: 28 mmol/L (ref 22–32)
Chloride: 107 mmol/L (ref 101–111)
Creatinine, Ser: 0.7 mg/dL (ref 0.44–1.00)
GFR calc non Af Amer: >60 mL/min (ref >60)
Glucose, Bld: 94 mg/dL (ref 65–99)
Potassium: 4 mmol/L (ref 3.5–5.1)
Sodium: 139 mmol/L (ref 135–145)
TOTAL PROTEIN: 6.8 g/dL (ref 6.5–8.1)

## 2016-06-17 LAB — CBC WITH DIFFERENTIAL/PLATELET
Basophils Absolute: 0 10*3/uL (ref 0.0–0.1)
Basophils Relative: 0 %
EOS ABS: 0.3 10*3/uL (ref 0.0–0.7)
Eosinophils Relative: 2 %
HEMATOCRIT: 33.8 % — AB (ref 36.0–46.0)
HEMOGLOBIN: 11.1 g/dL — AB (ref 12.0–15.0)
LYMPHS ABS: 4.7 10*3/uL — AB (ref 0.7–4.0)
Lymphocytes Relative: 34 %
MCH: 28.2 pg (ref 26.0–34.0)
MCHC: 32.8 g/dL (ref 30.0–36.0)
MCV: 86 fL (ref 78.0–100.0)
MONOS PCT: 9 %
Monocytes Absolute: 1.2 10*3/uL — ABNORMAL HIGH (ref 0.1–1.0)
NEUTROS ABS: 7.4 10*3/uL (ref 1.7–7.7)
NEUTROS PCT: 55 %
Platelets: 419 10*3/uL — ABNORMAL HIGH (ref 150–400)
RBC: 3.93 MIL/uL (ref 3.87–5.11)
RDW: 14.2 % (ref 11.5–15.5)
WBC: 13.6 10*3/uL — AB (ref 4.0–10.5)

## 2016-06-17 LAB — URINALYSIS, ROUTINE W REFLEX MICROSCOPIC
BILIRUBIN URINE: NEGATIVE
GLUCOSE, UA: NEGATIVE mg/dL
Hgb urine dipstick: NEGATIVE
KETONES UR: NEGATIVE mg/dL
LEUKOCYTES UA: NEGATIVE
NITRITE: NEGATIVE
PROTEIN: NEGATIVE mg/dL
Specific Gravity, Urine: 1.009 (ref 1.005–1.030)
pH: 5 (ref 5.0–8.0)

## 2016-06-17 LAB — PREGNANCY, URINE: Preg Test, Ur: NEGATIVE

## 2016-06-17 NOTE — ED Triage Notes (Addendum)
Pt had abdominal wall muscle tissue removed about a month ago, today pt noticed bruising and tenderness at site. Pt reports pain worse when standing or walking. No urinary symptoms, denies NVD. Abdomen slightly distended according to pt. No redness or signs of infection noted.

## 2016-06-17 NOTE — ED Notes (Signed)
This RN attempted IV access x2 without success.  Having charge RN look.

## 2016-06-17 NOTE — ED Provider Notes (Signed)
[facility]-EMERGENCY DEPT Provider Note   CSN: [id number] Arrival date & time: 06/17/16  2022    History   Chief Complaint Chief Complaint  Patient presents with  . Post-op Problem    HPI Ellen Booker is a 36 y.o. female.   36 year old female with a history of depression, 1 month s/p abdominal wall mass excision by Dr. Sheliah Hatch, presents to the emergency department for evaluation of abdominal pain. Patient states that she began to notice a small amount of swelling around side of her previous abdominal wall mass. She reports following up with her surgeon who conveyed that this was likely due to accumulation of fluid fillingof the patient's previous mass. Patient has noted the area increasing in size over the past few weeks. She had associated pain with it today. This was mildly relieved with Vicodin. She denies any strenuous activity, trauma to her abdominal wall, or heavy lifting. She has had no fevers, redness to the area, or drainage from the incision site. She reports being told by her surgeon to return to the emergency department if she developed pain associated with this incision site. Patient denies associated fever, bowel changes, nausea, or vomiting. She has been passing flatus without issue. She reports a normal bowel movement today.      Past Medical History:  Diagnosis Date  . Abdominal wall mass    left side  . Depression   . History of Helicobacter pylori infection    per pt 2016  . Renal cyst, right    per CT 04-04-2016  . Wears glasses     Patient Active Problem List   Diagnosis Date Noted  . Dysmenorrhea 02/01/2014  . Tobacco dependency 02/01/2014  . Obesity 12/09/2013  . Essential hypertension, benign 12/09/2013  . Candidiasis of vulva and vagina 12/08/2013  . Bilateral nipple discharge 11/08/2013  . 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 Unspecified disorder of menstruation and other abnormal bleeding from female genital tract 11/08/2013  . Irregular menstrual  cycle 11/08/2013  . UTI (lower urinary tract infection) 01/29/2013  . 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 Well woman exam with routine gynecological exam 01/26/2013  . BMI 40.0-44.9, adult (HCC) 01/26/2013  . Elevated BP 01/26/2013    Past Surgical History:  Procedure Laterality Date  . CESAREAN SECTION  2010  . COLONOSCOPY  last one 06-19-2015  . EXCISION ABDOMINAL WALL MASS  2012   right side  . EXCISION MASS ABDOMINAL N/A 05/21/2016   Procedure: EXCISION ABDOMINAL WALL MASS;  Surgeon: De Blanch Kinsinger, MD;  Location: [facility];  Service: General;  Laterality: N/A;    OB History    Gravida Para Term Preterm AB Living   1 1 1     1    SAB TAB Ectopic Multiple Live Births           1       Home Medications    Prior to Admission medications   Medication Sig Start Date End Date Taking? Authorizing Provider  buPROPion [facility] SR) 100 MG 12 hr tablet take 1 capsule by mouth twice a day for depression 06/05/16  Yes Historical Provider, MD  FLUoxetine (PROZAC) 10 MG tablet Take 10 mg by mouth every morning.   Yes Historical Provider, MD  HYDROcodone-acetaminophen (NORCO/VICODIN) 5-325 MG tablet Take 1-2 tablets by mouth every 6 (six) hours as needed for moderate pain. 05/21/16  Yes De Blanch Kinsinger, MD  lamoTRIgine (LAMICTAL) 100 MG tablet Take 50 mg by mouth 2 (two) times  daily.   Yes Historical Provider, MD  metFORMIN (GLUCOPHAGE-XR) 500 MG 24 hr tablet take 1 tablet by mouth once daily WITH BREAKFAST 04/15/16  Yes Brock Bad, MD  RESTASIS MULTIDOSE 0.05 % ophthalmic emulsion instill 1 drop into both eyes twice a day 05/18/16  Yes Historical Provider, MD  vitamin B-12 (CYANOCOBALAMIN) 1000 MCG tablet Take 1,000 mcg by mouth every morning.   Yes Historical Provider, MD  ibuprofen (ADVIL,MOTRIN) 600 MG tablet Take 1 tablet (600 mg total) by mouth every 6 (six) hours as needed. 06/18/16   Antony Madura, PA-C  oxyCODONE-acetaminophen (PERCOCET)  10-325 MG tablet Take 1 tablet by mouth every 6 (six) hours as needed for pain. 06/18/16   Antony Madura, PA-C  Prenat-FeCbn-FeAspGl-FA-Omega (OB COMPLETE PETITE) 35-5-1-200 MG CAPS Take 1 capsule by mouth daily before breakfast. Patient not taking: Reported on 06/17/2016 09/19/15   Brock Bad, MD    Family History Family History  Problem Relation Age of Onset  . HIV Father   . Uterine cancer      Social History Social History  Substance Use Topics  . Smoking status: Current Every Day Smoker    Packs/day: 0.50    Years: 19.00    Types: Cigarettes  . Smokeless tobacco: Never Used  . Alcohol use No     Allergies   Patient has no known allergies.   Review of Systems Review of Systems Ten systems reviewed and are negative for acute change, except as noted in the HPI.    Physical Exam Updated Vital Signs BP 135/67 (BP Location: Left Arm)   Pulse 78   Temp 98.5 F (36.9 C) (Oral)   Resp 18   Ht 5\' 4"  (1.626 m)   Wt 111.1 kg   SpO2 100%   BMI 42.05 kg/m   Physical Exam  Constitutional: She is oriented to person, place, and time. She appears well-developed and well-nourished. No distress.  Nontoxic and in NAD  HENT:  Head: Normocephalic and atraumatic.  Eyes: Conjunctivae and EOM are normal. No scleral icterus.  Neck: Normal range of motion.  Cardiovascular: Normal rate, regular rhythm and intact distal pulses.   Pulmonary/Chest: Effort normal. No respiratory distress. She has no wheezes.  Respirations even and unlabored  Abdominal: Soft. She exhibits mass. There is tenderness.    Soft, obese abdomen with palpable mass of unclear etiology in the suprapubic abdomen. There is TTP to the right sided aspect of the suprapubic abdomen. No erythema or heat to touch. No peritoneal signs. Recent surgical incisions are C/D/I. No hernia palpated.  Musculoskeletal: Normal range of motion.  Neurological: She is alert and oriented to person, place, and time. She exhibits  normal muscle tone. Coordination normal.  GCS 15. Patient moving all extremities.  Skin: Skin is warm and dry. No rash noted. She is not diaphoretic. No erythema. No pallor.  Psychiatric: She has a normal mood and affect. Her behavior is normal.  Nursing note and vitals reviewed.    ED Treatments / Results  Labs (all labs ordered are listed, but only abnormal results are displayed) Labs Reviewed  CBC WITH DIFFERENTIAL/PLATELET - Abnormal; Notable for the following:       Result Value   WBC 13.6 (*)    Hemoglobin 11.1 (*)    HCT 33.8 (*)    Platelets 419 (*)    Lymphs Abs 4.7 (*)    Monocytes Absolute 1.2 (*)    All other components within normal limits  COMPREHENSIVE METABOLIC PANEL -  Abnormal; Notable for the following:    Anion gap 4 (*)    All other components within normal limits  URINALYSIS, ROUTINE W REFLEX MICROSCOPIC  PREGNANCY, URINE    EKG  EKG Interpretation None       Radiology Ct Abdomen Pelvis W Contrast  Result Date: 06/18/2016 CLINICAL DATA:  Acute onset of tenderness and pain about the left side of the abdomen, status post removal of abdominal wall mass. Leukocytosis. Initial encounter. EXAM: CT ABDOMEN AND PELVIS WITH CONTRAST TECHNIQUE: Multidetector CT imaging of the abdomen and pelvis was performed using the standard protocol following bolus administration of intravenous contrast. CONTRAST:  100 mL of Isovue 300 IV contrast COMPARISON:  CT of the abdomen and pelvis from 04/04/2016 FINDINGS: Lower chest: The visualized lung bases are grossly clear. The visualized portions of the mediastinum are unremarkable. Hepatobiliary: The liver is unremarkable in appearance. The gallbladder is unremarkable in appearance. The common bile duct remains normal in caliber. Pancreas: The pancreas is within normal limits. Spleen: The spleen is unremarkable in appearance. Adrenals/Urinary Tract: The adrenal glands are unremarkable in appearance. A right renal cyst is noted.  There is no evidence of hydronephrosis. No renal or ureteral stones are identified. No perinephric stranding is seen. Stomach/Bowel: The stomach is unremarkable in appearance. The small bowel is within normal limits. The appendix is normal in caliber, without evidence of appendicitis. The colon is unremarkable in appearance. Vascular/Lymphatic: The abdominal aorta is unremarkable in appearance. The inferior vena cava is grossly unremarkable. No retroperitoneal lymphadenopathy is seen. No pelvic sidewall lymphadenopathy is identified. Reproductive: The bladder is mildly distended and within normal limits. The uterus is grossly unremarkable in appearance. The ovaries are relatively symmetric. No suspicious adnexal masses are seen. Other: A large collection of fluid is noted at the site of surgery, along the lower anterior abdominal wall just superficial to the fascia, measuring 11.7 x 10.0 x 9.0 cm, with mild peripheral enhancement, concerning for abscess formation. Would correlate with the patient's symptoms. No definite recurrent mass is seen at this time. Musculoskeletal: No acute osseous abnormalities are identified. The visualized musculature is unremarkable in appearance. IMPRESSION: 1. Large 11.7 x 7.0 x 9.0 cm collection of fluid at the prior site of surgery, along the lower anterior abdominal wall just superficial to the fascia, with mild peripheral enhancement, raising concern for abscess formation. Would correlate with the patient's symptoms. 2. Right renal cyst noted. Electronically Signed   By: Roanna RaiderJeffery  Chang M.D.   On: 06/18/2016 00:49    Procedures Procedures (including critical care time)  Medications Ordered in ED Medications  iopamidol (ISOVUE-300) 61 % injection 100 mL (100 mLs Intravenous Contrast Given 06/18/16 0018)  ketorolac (TORADOL) 30 MG/ML injection 30 mg (30 mg Intravenous Given 06/18/16 0046)     Initial Impression / Assessment and Plan / ED Course  I have reviewed the triage  vital signs and the nursing notes.  Pertinent labs & imaging results that were available during my care of the patient were reviewed by me and considered in my medical decision making (see chart for details).    Patient presents to the emergency department for worsening abdominal pain. She is one-month status post abdominal muscle resection secondary to mass. Patient reports worsening swelling at the site of her surgery. She does have a palpable mass to this area. There is no overlying erythema or heat to touch. Patient has not had any associated fevers. Symptoms have been progressing over the past few weeks.  CT scan  questions large abscess at site of patient's discomfort. I believe that symptoms are more likely to be related to a seroma given lack of infectious symptoms. Case discussed with Dr. Johna Sheriff of general surgery. He advises outpatient follow-up for aspiration in the office to further analyze fluid collection. I have recommended the use of Percocet and ibuprofen for pain control. Return precautions discussed and provided. Patient discharged in stable condition with no unaddressed concerns.   Final Clinical Impressions(s) / ED Diagnoses   Final diagnoses:  Abdominal wall fluid collections    New Prescriptions New Prescriptions   IBUPROFEN (ADVIL,MOTRIN) 600 MG TABLET    Take 1 tablet (600 mg total) by mouth every 6 (six) hours as needed.     Antony Madura, PA-C 06/18/16 1610    Rolland Porter, MD 06/26/16 [phone]

## 2016-06-18 ENCOUNTER — Encounter (HOSPITAL_COMMUNITY): Payer: Self-pay

## 2016-06-18 MED ORDER — OXYCODONE-ACETAMINOPHEN 10-325 MG PO TABS: 1.0000 | ORAL_TABLET | Freq: Four times a day (QID) | ORAL | 0 refills | Status: DC | PRN

## 2016-06-18 MED ORDER — IOPAMIDOL (ISOVUE-300) INJECTION 61%
INTRAVENOUS | Status: AC
  Administered 2016-06-18: 100 mL via INTRAVENOUS
  Filled 2016-06-18: qty 100

## 2016-06-18 MED ORDER — IOPAMIDOL (ISOVUE-300) INJECTION 61%
100.0000 mL | Freq: Once | INTRAVENOUS | Status: AC | PRN
  Administered 2016-06-18: 100 mL via INTRAVENOUS

## 2016-06-18 MED ORDER — IBUPROFEN 600 MG PO TABS: 600.0000 mg | ORAL_TABLET | Freq: Four times a day (QID) | ORAL | 0 refills | Status: DC | PRN

## 2016-06-18 MED ORDER — KETOROLAC TROMETHAMINE 30 MG/ML IJ SOLN
30.0000 mg | Freq: Once | INTRAMUSCULAR | Status: AC
  Administered 2016-06-18: 30 mg via INTRAVENOUS
  Filled 2016-06-18: qty 1

## 2016-06-18 NOTE — Discharge Instructions (Signed)
Take percocet WITH ibuprofen for pain control. Follow up with Dr. Sheliah HatchKinsinger in the office. You may return for new/concerning symptoms as needed.

## 2016-06-18 NOTE — ED Notes (Signed)
ED Provider at bedside. 

## 2016-06-18 NOTE — ED Notes (Signed)
Pt ambulatory and independent at discharge.  Verbalized understanding of discharge instructions 

## 2016-06-20 ENCOUNTER — Ambulatory Visit: Payer: Medicaid Other | Admitting: Obstetrics

## 2016-06-27 ENCOUNTER — Other Ambulatory Visit: Payer: Self-pay | Admitting: Obstetrics

## 2016-06-27 DIAGNOSIS — B9689 Other specified bacterial agents as the cause of diseases classified elsewhere: Secondary | ICD-10-CM

## 2016-06-27 DIAGNOSIS — N760 Acute vaginitis: Principal | ICD-10-CM

## 2016-07-03 ENCOUNTER — Encounter: Payer: Self-pay | Admitting: Obstetrics

## 2016-07-03 ENCOUNTER — Ambulatory Visit (INDEPENDENT_AMBULATORY_CARE_PROVIDER_SITE_OTHER): Payer: Medicaid Other | Admitting: Obstetrics

## 2016-07-03 ENCOUNTER — Other Ambulatory Visit (HOSPITAL_COMMUNITY)
Admission: RE | Admit: 2016-07-03 | Discharge: 2016-07-03 | Disposition: A | Discharge status: Home / Self Care | Payer: Medicaid Other | Source: Ambulatory Visit | Attending: Obstetrics | Admitting: Obstetrics

## 2016-07-03 VITALS — BP 155/92 | HR 88 | Wt 254.0 lb

## 2016-07-03 DIAGNOSIS — N839 Noninflammatory disorder of ovary, fallopian tube and broad ligament, unspecified: Secondary | ICD-10-CM | POA: Diagnosis not present

## 2016-07-03 DIAGNOSIS — N808 Other endometriosis: Secondary | ICD-10-CM | POA: Diagnosis not present

## 2016-07-03 DIAGNOSIS — N760 Acute vaginitis: Secondary | ICD-10-CM

## 2016-07-03 DIAGNOSIS — N898 Other specified noninflammatory disorders of vagina: Secondary | ICD-10-CM | POA: Diagnosis present

## 2016-07-03 DIAGNOSIS — N809 Endometriosis, unspecified: Secondary | ICD-10-CM

## 2016-07-03 DIAGNOSIS — B9689 Other specified bacterial agents as the cause of diseases classified elsewhere: Secondary | ICD-10-CM

## 2016-07-03 MED ORDER — METRONIDAZOLE 500 MG PO TABS: 500.0000 mg | ORAL_TABLET | Freq: Two times a day (BID) | ORAL | 6 refills | Status: DC

## 2016-07-03 MED ORDER — CLOMIPHENE CITRATE 50 MG PO TABS: ORAL_TABLET | ORAL | 0 refills | Status: DC

## 2016-07-03 NOTE — Patient Instructions (Addendum)

## 2016-07-03 NOTE — Progress Notes (Signed)
Patient ID: Ellen Booker, female   DOB: [birth year], 36 y.o.   MRN: [id number]  Chief Complaint  Patient presents with  . Vaginal Discharge    Vaginal discharge, odor.     HPI Wylie Russon is a 36 y.o. female.  Malodorous vaginal discharge. HPI  Past Medical History:  Diagnosis Date  . Abdominal wall mass    left side  . Depression   . History of Helicobacter pylori infection    per pt 2016  . Renal cyst, right    per CT 04-04-2016  . Wears glasses     Past Surgical History:  Procedure Laterality Date  . CESAREAN SECTION  2010  . COLONOSCOPY  last one 06-19-2015  . EXCISION ABDOMINAL WALL MASS  2012   right side  . EXCISION MASS ABDOMINAL N/A 05/21/2016   Procedure: EXCISION ABDOMINAL WALL MASS;  Surgeon: De Blanch Kinsinger, MD;  Location: [facility];  Service: General;  Laterality: N/A;    Family History  Problem Relation Age of Onset  . HIV Father   . Uterine cancer      Social History Social History  Substance Use Topics  . Smoking status: Current Every Day Smoker    Packs/day: 0.50    Years: 19.00    Types: Cigarettes  . Smokeless tobacco: Never Used  . Alcohol use No    No Known Allergies  Current Outpatient Prescriptions  Medication Sig Dispense Refill  . buPROPion (WELLBUTRIN SR) 100 MG 12 hr tablet take 1 capsule by mouth twice a day for depression  0  . FLUoxetine (PROZAC) 10 MG tablet Take 10 mg by mouth every morning.    Marland Kitchen ibuprofen (ADVIL,MOTRIN) 600 MG tablet Take 1 tablet (600 mg total) by mouth every 6 (six) hours as needed. 30 tablet 0  . lamoTRIgine (LAMICTAL) 100 MG tablet Take 50 mg by mouth 2 (two) times daily.    . Prenat-FeCbn-FeAspGl-FA-Omega (OB COMPLETE PETITE) 35-5-1-200 MG CAPS Take 1 capsule by mouth daily before breakfast. 30 capsule 11  . RESTASIS MULTIDOSE 0.05 % ophthalmic emulsion instill 1 drop into both eyes twice a day  0  . vitamin B-12 (CYANOCOBALAMIN) 1000 MCG tablet Take 1,000 mcg by mouth every  morning.    . clomiPHENE (CLOMID) 50 MG tablet 1 tab po daily, starting day 5 of menstrual cycle. 5 tablet 0  . HYDROcodone-acetaminophen (NORCO/VICODIN) 5-325 MG tablet Take 1-2 tablets by mouth every 6 (six) hours as needed for moderate pain. (Patient not taking: Reported on 07/03/2016) 30 tablet 0  . metFORMIN (GLUCOPHAGE-XR) 500 MG 24 hr tablet take 1 tablet by mouth once daily WITH BREAKFAST 120 tablet 11  . metroNIDAZOLE (FLAGYL) 500 MG tablet Take 1 tablet (500 mg total) by mouth 2 (two) times daily. 14 tablet 6  . oxyCODONE-acetaminophen (PERCOCET) 10-325 MG tablet Take 1 tablet by mouth every 6 (six) hours as needed for pain. (Patient not taking: Reported on 07/03/2016) 12 tablet 0   No current facility-administered medications for this visit.     Review of Systems Review of Systems Constitutional: negative for fatigue and weight loss Respiratory: negative for cough and wheezing Cardiovascular: negative for chest pain, fatigue and palpitations Gastrointestinal: negative for abdominal pain and change in bowel habits Genitourinary:negative Integument/breast: negative for nipple discharge Musculoskeletal:negative for myalgias Neurological: negative for gait problems and tremors Behavioral/Psych: negative for abusive relationship, depression Endocrine: negative for temperature intolerance      Blood pressure (!) 155/92, pulse 88, weight 254 lb (115.2 kg),  last menstrual period 06/03/2016.  Physical Exam Physical Exam           General:  Alert and no distress Abdomen:  normal findings: no organomegaly, soft, non-tender and no hernia  Pelvis:  External genitalia: normal general appearance Urinary system: urethral meatus normal and bladder without fullness, nontender Vaginal: normal without tenderness, induration or masses Cervix: normal appearance Adnexa: normal bimanual exam Uterus: anteverted and non-tender, normal size    >50% of 15 min visit spent on counseling and  coordination of care.    Data Reviewed Labs  Assessment     Malodorous vaginal discharge, probably BV History of ectopic endometriosis implantation, resected.  Doing well. Disorder of Ovulation    Plan    Flagyl Rx for BV Clomid Rx for ovulation disorder F/U prn   No orders of the defined types were placed in this encounter.  Meds ordered this encounter  Medications  . metroNIDAZOLE (FLAGYL) 500 MG tablet    Sig: Take 1 tablet (500 mg total) by mouth 2 (two) times daily.    Dispense:  14 tablet    Refill:  6  . clomiPHENE (CLOMID) 50 MG tablet    Sig: 1 tab po daily, starting day 5 of menstrual cycle.    Dispense:  5 tablet    Refill:  0

## 2016-07-03 NOTE — Progress Notes (Signed)
Patient presents for Vaginal Discharge with odor.

## 2016-07-04 LAB — CERVICOVAGINAL ANCILLARY ONLY
Bacterial vaginitis: NEGATIVE
Candida vaginitis: NEGATIVE
Chlamydia: NEGATIVE
Neisseria Gonorrhea: NEGATIVE
Trichomonas: NEGATIVE

## 2016-07-22 ENCOUNTER — Encounter (HOSPITAL_COMMUNITY): Payer: Self-pay

## 2016-07-22 ENCOUNTER — Emergency Department (HOSPITAL_COMMUNITY)
Admission: EM | Admit: 2016-07-22 | Discharge: 2016-07-22 | Disposition: A | Discharge status: Home / Self Care | Payer: Medicaid Other | Attending: Emergency Medicine | Admitting: Emergency Medicine

## 2016-07-22 ENCOUNTER — Emergency Department (HOSPITAL_COMMUNITY): Payer: Medicaid Other

## 2016-07-22 DIAGNOSIS — Z7984 Long term (current) use of oral hypoglycemic drugs: Secondary | ICD-10-CM | POA: Insufficient documentation

## 2016-07-22 DIAGNOSIS — I10 Essential (primary) hypertension: Secondary | ICD-10-CM | POA: Diagnosis not present

## 2016-07-22 DIAGNOSIS — R1030 Lower abdominal pain, unspecified: Secondary | ICD-10-CM | POA: Insufficient documentation

## 2016-07-22 DIAGNOSIS — Z79899 Other long term (current) drug therapy: Secondary | ICD-10-CM | POA: Insufficient documentation

## 2016-07-22 DIAGNOSIS — F17210 Nicotine dependence, cigarettes, uncomplicated: Secondary | ICD-10-CM | POA: Insufficient documentation

## 2016-07-22 DIAGNOSIS — G8918 Other acute postprocedural pain: Secondary | ICD-10-CM | POA: Diagnosis present

## 2016-07-22 LAB — BASIC METABOLIC PANEL
Anion gap: 7 (ref 5–15)
BUN: 7 mg/dL (ref 6–20)
CALCIUM: 9 mg/dL (ref 8.9–10.3)
CHLORIDE: 107 mmol/L (ref 101–111)
CO2: 23 mmol/L (ref 22–32)
CREATININE: 0.71 mg/dL (ref 0.44–1.00)
GFR calc Af Amer: >60 mL/min (ref >60)
GFR calc non Af Amer: >60 mL/min (ref >60)
Glucose, Bld: 103 mg/dL — ABNORMAL HIGH (ref 65–99)
Potassium: 3.8 mmol/L (ref 3.5–5.1)
SODIUM: 137 mmol/L (ref 135–145)

## 2016-07-22 LAB — CBC WITH DIFFERENTIAL/PLATELET
BASOS PCT: 0 %
Basophils Absolute: 0 10*3/uL (ref 0.0–0.1)
EOS ABS: 0.1 10*3/uL (ref 0.0–0.7)
Eosinophils Relative: 2 %
HCT: 35.8 % — ABNORMAL LOW (ref 36.0–46.0)
HEMOGLOBIN: 11.6 g/dL — AB (ref 12.0–15.0)
LYMPHS ABS: 3.5 10*3/uL (ref 0.7–4.0)
Lymphocytes Relative: 38 %
MCH: 27.9 pg (ref 26.0–34.0)
MCHC: 32.4 g/dL (ref 30.0–36.0)
MCV: 86.1 fL (ref 78.0–100.0)
MONO ABS: 0.5 10*3/uL (ref 0.1–1.0)
MONOS PCT: 6 %
NEUTROS PCT: 54 %
Neutro Abs: 5 10*3/uL (ref 1.7–7.7)
Platelets: 338 10*3/uL (ref 150–400)
RBC: 4.16 MIL/uL (ref 3.87–5.11)
RDW: 13.6 % (ref 11.5–15.5)
WBC: 9.2 10*3/uL (ref 4.0–10.5)

## 2016-07-22 LAB — I-STAT BETA HCG BLOOD, ED (MC, WL, AP ONLY): I-stat hCG, quantitative: <5 m[IU]/mL (ref <5)

## 2016-07-22 MED ORDER — MORPHINE SULFATE (PF) 4 MG/ML IV SOLN
4.0000 mg | Freq: Once | INTRAVENOUS | Status: AC
  Administered 2016-07-22: 4 mg via INTRAVENOUS
  Filled 2016-07-22: qty 1

## 2016-07-22 MED ORDER — IOPAMIDOL (ISOVUE-300) INJECTION 61%
INTRAVENOUS | Status: AC
  Administered 2016-07-22: 100 mL
  Filled 2016-07-22: qty 100

## 2016-07-22 MED ORDER — HYDROCODONE-ACETAMINOPHEN 5-325 MG PO TABS: 1.0000 | ORAL_TABLET | Freq: Four times a day (QID) | ORAL | 0 refills | Status: DC | PRN

## 2016-07-22 NOTE — ED Notes (Signed)
US at bedside

## 2016-07-22 NOTE — ED Triage Notes (Signed)
Per Pt, Pt had tumor removal about two months ago. Reports noting bruising and pain at the surgical site today. Denies fevers.

## 2016-07-22 NOTE — ED Provider Notes (Addendum)
[facility]-EMERGENCY DEPT Provider Note   CSN: [id number] Arrival date & time: 07/22/16  4098     History   Chief Complaint Chief Complaint  Patient presents with   Post-op Problem    HPI Ellen Booker is a 36 y.o. female.  Patient presents with complaint of abdominal pain in infraumbilical area. She reports she had surgery in February to remove a mass that was diagnosed, per patient, as benign endometrial tissue. In March she returned to the emergency department with recurrent pain and swelling and was found to have a "fluid collection" that was drained in follow up visit in the office at [facility]. Primary surgeon is Dr. Sheliah Hatch. Today she feels discomfort that she reports is different from last month's fluid collection. No fever, nausea, vomiting. No change in bowel habits..   The history is provided by the patient. No language interpreter was used.    Past Medical History:  Diagnosis Date   Abdominal wall mass    left side   Depression    History of Helicobacter pylori infection    per pt 2016   Renal cyst, right    per CT 04-04-2016   Wears glasses     Past Surgical History:  Procedure Laterality Date   CESAREAN SECTION  2010   COLONOSCOPY  last one 06-19-2015   EXCISION ABDOMINAL WALL MASS  2012   right side   EXCISION MASS ABDOMINAL N/A 05/21/2016   Procedure: EXCISION ABDOMINAL WALL MASS;  Surgeon: De Blanch Kinsinger, MD;  Location: [facility];  Service: General;  Laterality: N/A;    OB History     Gravida Para Term Preterm AB Living   SAB TAB Ectopic Multiple Live Births           1        Home Medications    Prior to Admission medications   Medication Sig Start Date End Date Taking? Authorizing Provider  buPROPion [facility] SR) 100 MG 12 hr tablet take 1 capsule by mouth twice a day for depression 06/05/16   Historical Provider, MD  clomiPHENE (CLOMID) 50 MG tablet 1 tab po daily, starting day 5 of menstrual cycle.  07/03/16   Brock Bad, MD  FLUoxetine (PROZAC) 10 MG tablet Take 10 mg by mouth every morning.    Historical Provider, MD  HYDROcodone-acetaminophen (NORCO/VICODIN) 5-325 MG tablet Take 1-2 tablets by mouth every 6 (six) hours as needed for moderate pain. Patient not taking: Reported on 07/03/2016 05/21/16   De Blanch Kinsinger, MD  ibuprofen (ADVIL,MOTRIN) 600 MG tablet Take 1 tablet (600 mg total) by mouth every 6 (six) hours as needed. 06/18/16   Antony Madura, PA-C  lamoTRIgine (LAMICTAL) 100 MG tablet Take 50 mg by mouth 2 (two) times daily.    Historical Provider, MD  metFORMIN (GLUCOPHAGE-XR) 500 MG 24 hr tablet take 1 tablet by mouth once daily WITH BREAKFAST 04/15/16   Brock Bad, MD  metroNIDAZOLE (FLAGYL) 500 MG tablet Take 1 tablet (500 mg total) by mouth 2 (two) times daily. 07/03/16   Brock Bad, MD  oxyCODONE-acetaminophen (PERCOCET) 10-325 MG tablet Take 1 tablet by mouth every 6 (six) hours as needed for pain. Patient not taking: Reported on 07/03/2016 06/18/16   Antony Madura, PA-C  Prenat-FeCbn-FeAspGl-FA-Omega (OB COMPLETE PETITE) 35-5-1-200 MG CAPS Take 1 capsule by mouth daily before breakfast. 09/19/15   Brock Bad, MD  RESTASIS MULTIDOSE 0.05 % ophthalmic emulsion  instill 1 drop into both eyes twice a day 05/18/16   Historical Provider, MD  vitamin B-12 (CYANOCOBALAMIN) 1000 MCG tablet Take 1,000 mcg by mouth every morning.    Historical Provider, MD    Family History Family History  Problem Relation Age of Onset   HIV Father    Uterine cancer      Social History Social History  Substance Use Topics   Smoking status: Current Every Day Smoker    Packs/day: 0.50    Years: 19.00    Types: Cigarettes   Smokeless tobacco: Never Used   Alcohol use No     Allergies   Patient has no known allergies.   Review of Systems Review of Systems  Constitutional: Negative for chills and fever.  Respiratory: Negative.  Negative for shortness of breath.    Cardiovascular: Negative.  Negative for chest pain.  Gastrointestinal: Positive for abdominal pain. Negative for nausea and vomiting.  Genitourinary: Negative.  Negative for pelvic pain, vaginal bleeding and vaginal discharge.  Musculoskeletal: Negative.   Skin: Negative.   Neurological: Negative.      Physical Exam Updated Vital Signs BP 138/76 (BP Location: Right Arm)   Pulse 97   Temp 98.1 F (36.7 C) (Oral)   Resp 16   Ht  (1.626 m)   Wt 108.9 kg   SpO2 99%   BMI 41.20 kg/m   Physical Exam  Constitutional: She is oriented to person, place, and time. She appears well-developed and well-nourished.  HENT:  Head: Normocephalic.  Neck: Normal range of motion. Neck supple.  Cardiovascular: Normal rate and regular rhythm.   Pulmonary/Chest: Effort normal and breath sounds normal.  Abdominal: Soft. Bowel sounds are normal. There is tenderness. There is no rebound and no guarding. No hernia.    Infraumbilical firmness without discrete abscess. There are skin changes of hyperpigmentation without erythema.  Musculoskeletal: Normal range of motion.  Neurological: She is alert and oriented to person, place, and time.  Skin: Skin is warm and dry. No rash noted.  Psychiatric: She has a normal mood and affect.     ED Treatments / Results  Labs (all labs ordered are listed, but only abnormal results are displayed) Labs Reviewed  CBC WITH DIFFERENTIAL/PLATELET - Abnormal; Notable for the following:       Result Value   Hemoglobin 11.6 (*)    HCT 35.8 (*)    All other components within normal limits  BASIC METABOLIC PANEL  I-STAT BETA HCG BLOOD, ED ([facility], [facility], [facility] ONLY)    EKG  EKG Interpretation None        Radiology No results found.  Procedures Procedures (including critical care time)  Medications Ordered in ED Medications - No data to display   Initial Impression / Assessment and Plan / ED Course  I have reviewed the triage vital signs and the nursing  notes.  Pertinent labs & imaging results that were available during my care of the patient were reviewed by me and considered in my medical decision making (see chart for details).     Patient presents with recurrent lower abdominal pain and "discoloration" or bruising of the area. No fever. Pain started yesterday.   Labs and exam today do not support infection - no cellulitic changes, leukocytosis or fever. CT scan repeated and shows persistent fluid collection that is significantly improved from last study - previously 12 cm now 5.5 cm. No evidence new process.   Discussed with general surgery who states that she  can follow up in the office for recheck and review of CT. Return precautions discussed.  Final Clinical Impressions(s) / ED Diagnoses   Final diagnoses:  None  1. Abdominal pain  New Prescriptions New Prescriptions   No medications on file     Elpidio Anis, Cordelia Poche 07/22/16 1314    Heide Scales, MD 07/22/16 2139    Evelisse Szalkowski, Canary Brim, MD 03/19/22 1450

## 2016-09-24 ENCOUNTER — Other Ambulatory Visit (HOSPITAL_COMMUNITY): Payer: Self-pay | Admitting: General Surgery

## 2016-09-24 DIAGNOSIS — M96842 Postprocedural seroma of a musculoskeletal structure following a musculoskeletal system procedure: Secondary | ICD-10-CM

## 2016-10-01 ENCOUNTER — Other Ambulatory Visit: Payer: Self-pay | Admitting: Student

## 2016-10-01 ENCOUNTER — Other Ambulatory Visit: Payer: Self-pay | Admitting: Physician Assistant

## 2016-10-02 ENCOUNTER — Ambulatory Visit (HOSPITAL_COMMUNITY)
Admission: RE | Admit: 2016-10-02 | Discharge: 2016-10-02 | Disposition: A | Discharge status: Home / Self Care | Payer: Medicaid Other | Source: Ambulatory Visit | Attending: General Surgery | Admitting: General Surgery

## 2016-10-02 ENCOUNTER — Encounter (HOSPITAL_COMMUNITY): Payer: Self-pay

## 2016-10-02 DIAGNOSIS — M96842 Postprocedural seroma of a musculoskeletal structure following a musculoskeletal system procedure: Secondary | ICD-10-CM | POA: Insufficient documentation

## 2016-10-02 LAB — CBC
HEMATOCRIT: 35.1 % — AB (ref 36.0–46.0)
Hemoglobin: 11 g/dL — ABNORMAL LOW (ref 12.0–15.0)
MCH: 27.4 pg (ref 26.0–34.0)
MCHC: 31.3 g/dL (ref 30.0–36.0)
MCV: 87.3 fL (ref 78.0–100.0)
PLATELETS: 381 10*3/uL (ref 150–400)
RBC: 4.02 MIL/uL (ref 3.87–5.11)
RDW: 15.2 % (ref 11.5–15.5)
WBC: 9.5 10*3/uL (ref 4.0–10.5)

## 2016-10-02 LAB — PROTIME-INR
INR: 1.01
PROTHROMBIN TIME: 13.3 s (ref 11.4–15.2)

## 2016-10-02 LAB — APTT: APTT: 29 s (ref 24–36)

## 2016-10-02 LAB — PREGNANCY, URINE: PREG TEST UR: NEGATIVE

## 2016-10-02 MED ORDER — SODIUM CHLORIDE 0.9 % IV SOLN: INTRAVENOUS | Status: DC

## 2016-10-02 MED ORDER — LIDOCAINE HCL (PF) 1 % IJ SOLN
INTRAMUSCULAR | Status: AC
  Filled 2016-10-02: qty 30

## 2016-10-02 NOTE — Procedures (Signed)
Post op abd wall seroma  Attempted US aspiration of the abdominal wall complex area  No fluid could be aspirated Dense fibrous scar tissue noted No sig residual seroma to drian  EBL 0  FULL report in PACS

## 2016-10-02 NOTE — H&P (Signed)
Chief Complaint: Patient was seen in consultation today for abdominal wall seroma at the request of Rodman PickleKinsinger,Luke Aaron  Referring Physician(s): Rodman PickleKinsinger,Luke Aaron  Supervising Physician: Ruel FavorsShick, Trevor  Patient Status: [facility] - [facility]  History of Present Illness: Ellen Booker is a 36 y.o. female who had surgical resection of an abdominal wall mass about 5 months ago. This proved to be benign, but she has been dealing with a post op seroma seroma since that time. Last imaging was April showing an at least 5.5 cm fluid collection. She exhibits no infectious sxs. She is referred for US guided aspiration as the seroma does not appear to be completely resolving on its own. PMHx, meds, labs, imaging reviewed. Has been NPO this am.  Past Medical History:  Diagnosis Date  . Abdominal wall mass    left side  . Depression   . History of Helicobacter pylori infection    per pt 2016  . Renal cyst, right    per CT 04-04-2016  . Wears glasses     Past Surgical History:  Procedure Laterality Date  . CESAREAN SECTION  2010  . COLONOSCOPY  last one 06-19-2015  . EXCISION ABDOMINAL WALL MASS  2012   right side  . EXCISION MASS ABDOMINAL N/A 05/21/2016   Procedure: EXCISION ABDOMINAL WALL MASS;  Surgeon: De BlanchLuke Aaron Kinsinger, MD;  [facility] [place];  Service: General;  Laterality: N/A;  . RESECTION OF ABDOMINAL MASS      Allergies: Patient has no known allergies.  Medications: Prior to Admission medications   Medication Sig Start Date End Date Taking? Authorizing Provider  buPROPion [facility](WELLBUTRIN SR) 100 MG 12 hr tablet take 1 capsule by mouth twice a day for depression 06/05/16  Yes [provider]  FLUoxetine (PROZAC) 10 MG tablet Take 10 mg by mouth every morning.   Yes [provider]  HYDROcodone-acetaminophen (NORCO/VICODIN) 5-325 MG tablet Take 1-2 tablets by mouth every 6 (six) hours as needed for moderate pain. 07/22/16  Yes Elpidio AnisUpstill,  Shari, PA-C  lamoTRIgine (LAMICTAL) 100 MG tablet Take 100 mg by mouth 2 (two) times daily.    Yes [provider]  metFORMIN (GLUCOPHAGE-XR) 500 MG 24 hr tablet take 1 tablet by mouth once daily WITH BREAKFAST 04/15/16  Yes Brock BadHarper, Charles A, MD  RESTASIS MULTIDOSE 0.05 % ophthalmic emulsion instill 1 drop into both eyes twice a day 05/18/16  Yes [provider]  ibuprofen (ADVIL,MOTRIN) 600 MG tablet Take 1 tablet (600 mg total) by mouth every 6 (six) hours as needed. 06/18/16   Antony MaduraHumes, Kelly, PA-C  Prenat-FeCbn-FeAspGl-FA-Omega (OB COMPLETE PETITE) 35-5-1-200 MG CAPS Take 1 capsule by mouth daily before breakfast. 09/19/15   Brock BadHarper, Charles A, MD     Family History  Problem Relation Age of Onset  . HIV Father   . Uterine cancer Unknown     Social History   Social History  . Marital status: Single    Spouse name: N/A  . Number of children: N/A  . Years of education: N/A   Social History Main Topics  . Smoking status: Current Every Day Smoker    Packs/day: 0.50    Years: 19.00    Types: Cigarettes  . Smokeless tobacco: Never Used  . Alcohol use No  . Drug use: No  . Sexual activity: Yes    Partners: Male    Birth control/ protection: None   Other Topics Concern  . None   Social History Narrative  . None  Review of Systems: A 12 point ROS discussed and pertinent positives are indicated in the HPI above.  All other systems are negative.  Review of Systems  Vital Signs: BP 136/90   Pulse 79   Temp 98.3 F (36.8 C) (Oral)   Resp 16   Ht 5\' 4"  (1.626 m)   Wt 255 lb (115.7 kg)   LMP 09/26/2016   SpO2 99%   BMI 43.77 kg/m   Physical Exam  Constitutional: She is oriented to person, place, and time. She appears well-developed. No distress.  HENT:  Head: Normocephalic.  Mouth/Throat: Oropharynx is clear and moist.  Neck: Normal range of motion. No JVD present. No tracheal deviation present.  Cardiovascular: Normal rate, regular rhythm and normal  heart sounds.   Pulmonary/Chest: Effort normal and breath sounds normal. No respiratory distress.  Abdominal: Soft. There is no tenderness.  Neurological: She is alert and oriented to person, place, and time.  Skin: Skin is warm and dry.  Psychiatric: She has a normal mood and affect. Judgment normal.    Mallampati Score:  MD Evaluation Airway: WNL Heart: WNL Abdomen: WNL Chest/ Lungs: WNL ASA  Classification: 2 Mallampati/Airway Score: One  Imaging: No results found.  Labs:  CBC:  Recent Labs  03/29/16 0932 05/21/16 1152 06/17/16 2244 07/22/16 0933 10/02/16 0612  WBC 10.8*  --  13.6* 9.2 9.5  HGB 11.7* 12.8 11.1* 11.6* 11.0*  HCT 35.9*  --  33.8* 35.8* 35.1*  PLT 365  --  419* 338 381    COAGS: No results for input(s): INR, APTT in the last 8760 hours.  BMP:  Recent Labs  03/29/16 0932 06/17/16 2244 07/22/16 0933  NA 137 139 137  K 4.0 4.0 3.8  CL 105 107 107  CO2 25 28 23   GLUCOSE 105* 94 103*  BUN 9 9 7   CALCIUM 8.8* 9.3 9.0  CREATININE 0.68 0.70 0.71  GFRNONAA >60 >60 >60  GFRAA >60 >60 >60    LIVER FUNCTION TESTS:  Recent Labs  03/29/16 0932 06/17/16 2244  BILITOT 0.2* 0.4  AST 20 15  ALT 14 14  ALKPHOS 49 65  PROT 7.0 6.8  ALBUMIN 3.9 3.7    TUMOR MARKERS: No results for input(s): AFPTM, CEA, CA199, CHROMGRNA in the last 8760 hours.  Assessment and Plan: Abdominal wall seroma For US guided aspiration Labs reviewed. Risks and Benefits discussed with the patient including, but not limited to bleeding, infection, damage to adjacent structures or low yield requiring additional tests. All of the patient's questions were answered, patient is agreeable to proceed. Consent signed and in chart.    Thank you for this interesting consult.  I greatly enjoyed meeting MCKINZY FULLER and look forward to participating in their care.  A copy of this report was sent to the requesting provider on this date.  Electronically  Signed: Brayton El, PA-C 10/02/2016, 7:29 AM   I spent a total of 20 minutes in face to face in clinical consultation, greater than 50% of which was counseling/coordinating care for aspiration of seroma

## 2016-11-11 ENCOUNTER — Other Ambulatory Visit: Payer: Self-pay | Admitting: Obstetrics

## 2017-10-22 ENCOUNTER — Encounter (HOSPITAL_COMMUNITY): Payer: Self-pay | Admitting: Emergency Medicine

## 2017-10-22 ENCOUNTER — Emergency Department (HOSPITAL_COMMUNITY): Payer: Self-pay

## 2017-10-22 ENCOUNTER — Other Ambulatory Visit: Payer: Self-pay

## 2017-10-22 ENCOUNTER — Emergency Department (HOSPITAL_COMMUNITY)
Admission: EM | Admit: 2017-10-22 | Discharge: 2017-10-22 | Disposition: A | Discharge status: Home / Self Care | Payer: Self-pay | Attending: Emergency Medicine | Admitting: Emergency Medicine

## 2017-10-22 DIAGNOSIS — F17210 Nicotine dependence, cigarettes, uncomplicated: Secondary | ICD-10-CM | POA: Insufficient documentation

## 2017-10-22 DIAGNOSIS — X501XXA Overexertion from prolonged static or awkward postures, initial encounter: Secondary | ICD-10-CM | POA: Insufficient documentation

## 2017-10-22 DIAGNOSIS — Y929 Unspecified place or not applicable: Secondary | ICD-10-CM | POA: Insufficient documentation

## 2017-10-22 DIAGNOSIS — S63601A Unspecified sprain of right thumb, initial encounter: Secondary | ICD-10-CM | POA: Insufficient documentation

## 2017-10-22 DIAGNOSIS — Y939 Activity, unspecified: Secondary | ICD-10-CM | POA: Insufficient documentation

## 2017-10-22 DIAGNOSIS — Y999 Unspecified external cause status: Secondary | ICD-10-CM | POA: Insufficient documentation

## 2017-10-22 DIAGNOSIS — Z791 Long term (current) use of non-steroidal anti-inflammatories (NSAID): Secondary | ICD-10-CM | POA: Insufficient documentation

## 2017-10-22 NOTE — ED Notes (Signed)
Declined W/C at D/C and was escorted to lobby by RN. 

## 2017-10-22 NOTE — ED Triage Notes (Signed)
Patient complains of pain to her right thumb after a fall a week and a half ago. Pain exacerbated by movement. No discoloration or significant swelling to area. Patient alert, oriented, and in no apparent distress at this time.

## 2017-10-22 NOTE — Progress Notes (Signed)
Orthopedic Tech Progress Note Patient Details:  Jerene CannyVanessa L Jacobs 12-28-1980 [id number]  Ortho Devices Type of Ortho Device: Thumb velcro splint Ortho Device/Splint Location: rue Ortho Device/Splint Interventions: Application   Post Interventions Patient Tolerated: Well Instructions Provided: Care of device   Nikki DomCrawford, Larnell Granlund 10/22/2017, 1:53 PM

## 2017-10-22 NOTE — ED Provider Notes (Addendum)
[facility] Provider Note   CSN: [id number] Arrival date & time: 10/22/17  1232     History   Chief Complaint Chief Complaint  Patient presents with   Finger Injury    HPI Ellen Booker is a 37 y.o. female.  Patient presents with complaint of continued right thumb pain and weakness with grasping after a fall where she bent her thumb backwards about a week and a half ago.  Patient has pain with motion and palpation of the thumb and base of finger.  She denies wrist, elbow, or shoulder pain.  Onset of symptoms acute.  Course is constant.     Past Medical History:  Diagnosis Date   Abdominal wall mass    left side   Depression    History of Helicobacter pylori infection    per pt 2016   Renal cyst, right    per CT 04-04-2016   Wears glasses     Past Surgical History:  Procedure Laterality Date   CESAREAN SECTION  2010   COLONOSCOPY  last one 06-19-2015   EXCISION ABDOMINAL WALL MASS  2012   right side   EXCISION MASS ABDOMINAL N/A 05/21/2016   Procedure: EXCISION ABDOMINAL WALL MASS;  Surgeon: De BlanchLuke Aaron Kinsinger, MD;  [facility] [place];  Service: General;  Laterality: N/A;   RESECTION OF ABDOMINAL MASS       OB History     Gravida  1   Para  1   Term  1   Preterm      AB      Living  1      SAB      TAB      Ectopic      Multiple      Live Births  1            Home Medications    Prior to Admission medications   Medication Sig Start Date End Date Taking? Authorizing Provider  buPROPion [facility](WELLBUTRIN SR) 100 MG 12 hr tablet take 1 capsule by mouth twice a day for depression 06/05/16   [provider]  fluconazole (DIFLUCAN) 150 MG tablet take 1 tablet by mouth as a single dose 11/13/16   Brock BadHarper, Charles A, MD  FLUoxetine (PROZAC) 10 MG tablet Take 10 mg by mouth every morning.    [provider]  HYDROcodone-acetaminophen (NORCO/VICODIN) 5-325 MG tablet Take  1-2 tablets by mouth every 6 (six) hours as needed for moderate pain. 07/22/16   Elpidio AnisUpstill, Shari, PA-C  ibuprofen (ADVIL,MOTRIN) 600 MG tablet Take 1 tablet (600 mg total) by mouth every 6 (six) hours as needed. 06/18/16   Antony MaduraHumes, Kelly, PA-C  lamoTRIgine (LAMICTAL) 100 MG tablet Take 100 mg by mouth 2 (two) times daily.     [provider]  metFORMIN (GLUCOPHAGE-XR) 500 MG 24 hr tablet take 1 tablet by mouth once daily WITH BREAKFAST 04/15/16   Brock BadHarper, Charles A, MD  Prenat-FeCbn-FeAspGl-FA-Omega (OB COMPLETE PETITE) 35-5-1-200 MG CAPS Take 1 capsule by mouth daily before breakfast. 09/19/15   Brock BadHarper, Charles A, MD  RESTASIS MULTIDOSE 0.05 % ophthalmic emulsion instill 1 drop into both eyes twice a day 05/18/16   [provider]    Family History Family History  Problem Relation Age of Onset   HIV Father    Uterine cancer Unknown     Social History Social History   Tobacco Use   Smoking status: Current Every Day Smoker  Packs/day: 0.50    Years: 19.00    Pack years: 9.50    Types: Cigarettes   Smokeless tobacco: Never Used  Substance Use Topics   Alcohol use: No    Alcohol/week: 0.0 oz   Drug use: No     Allergies   Patient has no known allergies.   Review of Systems Review of Systems  Constitutional: Negative for activity change.  Musculoskeletal: Positive for arthralgias. Negative for back pain, joint swelling and neck pain.  Skin: Negative for wound.  Neurological: Positive for weakness. Negative for numbness.     Physical Exam Updated Vital Signs BP 140/69 (BP Location: Right Arm)   Pulse 95   Temp 98.8 F (37.1 C) (Oral)   Resp 16   LMP 10/05/2017   SpO2 98%   Physical Exam  Constitutional: She appears well-developed and well-nourished.  HENT:  Head: Normocephalic and atraumatic.  Eyes: Pupils are equal, round, and reactive to light.  Neck: Normal range of motion. Neck supple.  Cardiovascular: Normal pulses. Exam reveals no decreased  pulses.  Musculoskeletal: She exhibits tenderness. She exhibits no edema.       Hands: Neurological: She is alert. No sensory deficit.  Sensation and vascular distal to the injury is fully intact.   Skin: Skin is warm and dry.  Psychiatric: She has a normal mood and affect.  Nursing note and vitals reviewed.    ED Treatments / Results  Labs (all labs ordered are listed, but only abnormal results are displayed) Labs Reviewed - No data to display  EKG None  Radiology Dg Finger Thumb Right  Result Date: 10/22/2017 CLINICAL DATA:  Right thumb pain after fall at home 2 weeks ago. EXAM: RIGHT THUMB 2+V COMPARISON:  None. FINDINGS: There is no evidence of fracture or dislocation. There is no evidence of arthropathy or other focal bone abnormality. Soft tissues are unremarkable IMPRESSION: Normal right thumb. Electronically Signed   By: Lupita Raider, M.D.   On: 10/22/2017 13:10    Procedures Procedures (including critical care time)  Medications Ordered in ED Medications - No data to display   Initial Impression / Assessment and Plan / ED Course  I have reviewed the triage vital signs and the nursing notes.  Pertinent labs & imaging results that were available during my care of the patient were reviewed by me and considered in my medical decision making (see chart for details).  Clinical Course as of Oct 23 1334  Wed Oct 22, 2017  1318 DG Finger Thumb Right [NL]    Clinical Course User Index [NL] Laurice Record, [place]   Patient seen and examined.  Informed patient of x-ray results.  Will give Velcro thumb spica for comfort and give orthopedic hand follow-up for further evaluation and treatment.  Vital signs reviewed and are as follows: BP 140/69 (BP Location: Right Arm)   Pulse 95   Temp 98.8 F (37.1 C) (Oral)   Resp 16   LMP 10/05/2017   SpO2 98%     Final Clinical Impressions(s) / ED Diagnoses   Final diagnoses:  Sprain of right thumb, unspecified site  of finger, initial encounter   Patient with right thumb sprain after fall.  Imaging negative.  No neuro deficits.  Patient is having difficulty with flexion at the IP joint and also pain at the base of the thumb.  No indications for emergent orthopedic follow-up.  ED Discharge Orders     None  Renne Crigler, PA-C 10/22/17 1340    Nura Cahoon, Canary Brim, MD 10/22/17 1533    Jermia Rigsby, Canary Brim, MD 03/19/22 [phone]

## 2017-10-22 NOTE — Discharge Instructions (Signed)
Please read and follow all provided instructions.  Your diagnoses today include:  1. Sprain of right thumb, unspecified site of finger, initial encounter     Tests performed today include:  An x-ray of the affected area - does NOT show any broken bones  Vital signs. See below for your results today.   Medications prescribed:   None  Take any prescribed medications only as directed.  Home care instructions:   Follow any educational materials contained in this packet  Follow R.I.C.E. Protocol:  R - rest your injury   I  - use ice on injury without applying directly to skin  C - compress injury with bandage or splint  E - elevate the injury as much as possible  Follow-up instructions: Please follow-up with your primary care provider or the provided orthopedic physician (bone specialist) if you continue to have significant pain in 1 week. In this case you may have a more severe injury that requires further care.   Return instructions:   Please return if your fingers are numb or tingling, appear gray or blue, or you have severe pain (also elevate the arm and loosen splint or wrap if you were given one)  Please return to the Emergency Department if you experience worsening symptoms.   Please return if you have any other emergent concerns.  Additional Information:  Your vital signs today were: BP 140/69 (BP Location: Right Arm)    Pulse 95    Temp 98.8 F (37.1 C) (Oral)    Resp 16    LMP 10/05/2017    SpO2 98%  If your blood pressure (BP) was elevated above 135/85 this visit, please have this repeated by your doctor within one month. --------------

## 2017-11-21 ENCOUNTER — Emergency Department (HOSPITAL_COMMUNITY)
Admission: EM | Admit: 2017-11-21 | Discharge: 2017-11-21 | Disposition: A | Discharge status: Home / Self Care | Payer: No Typology Code available for payment source | Attending: Emergency Medicine | Admitting: Emergency Medicine

## 2017-11-21 ENCOUNTER — Encounter (HOSPITAL_COMMUNITY): Payer: Self-pay | Admitting: Emergency Medicine

## 2017-11-21 ENCOUNTER — Other Ambulatory Visit: Payer: Self-pay

## 2017-11-21 DIAGNOSIS — D649 Anemia, unspecified: Secondary | ICD-10-CM | POA: Insufficient documentation

## 2017-11-21 DIAGNOSIS — R55 Syncope and collapse: Secondary | ICD-10-CM | POA: Insufficient documentation

## 2017-11-21 DIAGNOSIS — Z041 Encounter for examination and observation following transport accident: Secondary | ICD-10-CM | POA: Insufficient documentation

## 2017-11-21 DIAGNOSIS — I10 Essential (primary) hypertension: Secondary | ICD-10-CM | POA: Insufficient documentation

## 2017-11-21 DIAGNOSIS — Z79899 Other long term (current) drug therapy: Secondary | ICD-10-CM | POA: Diagnosis not present

## 2017-11-21 DIAGNOSIS — F17210 Nicotine dependence, cigarettes, uncomplicated: Secondary | ICD-10-CM | POA: Diagnosis not present

## 2017-11-21 LAB — BASIC METABOLIC PANEL
Anion gap: 7 (ref 5–15)
BUN: 6 mg/dL (ref 6–20)
CO2: 25 mmol/L (ref 22–32)
Calcium: 8.8 mg/dL — ABNORMAL LOW (ref 8.9–10.3)
Chloride: 106 mmol/L (ref 98–111)
Creatinine, Ser: 0.81 mg/dL (ref 0.44–1.00)
GFR calc Af Amer: >60 mL/min (ref >60)
GFR calc non Af Amer: >60 mL/min (ref >60)
Glucose, Bld: 101 mg/dL — ABNORMAL HIGH (ref 70–99)
Potassium: 3.6 mmol/L (ref 3.5–5.1)
Sodium: 138 mmol/L (ref 135–145)

## 2017-11-21 LAB — CBC
HEMATOCRIT: 31.1 % — AB (ref 36.0–46.0)
HEMOGLOBIN: 9.5 g/dL — AB (ref 12.0–15.0)
MCH: 23.2 pg — ABNORMAL LOW (ref 26.0–34.0)
MCHC: 30.5 g/dL (ref 30.0–36.0)
MCV: 75.9 fL — ABNORMAL LOW (ref 78.0–100.0)
Platelets: 455 10*3/uL — ABNORMAL HIGH (ref 150–400)
RBC: 4.1 MIL/uL (ref 3.87–5.11)
RDW: 17.1 % — AB (ref 11.5–15.5)
WBC: 10.8 10*3/uL — AB (ref 4.0–10.5)
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
## 2017-11-21 NOTE — Discharge Instructions (Addendum)
Please take iron over the counter Follow up for recheck with doctor next week

## 2018-05-20 ENCOUNTER — Ambulatory Visit (HOSPITAL_COMMUNITY)
Admission: EM | Admit: 2018-05-20 | Discharge: 2018-05-20 | Disposition: A | Discharge status: Home / Self Care | Payer: Medicaid Other | Attending: Family Medicine | Admitting: Family Medicine

## 2018-05-20 ENCOUNTER — Encounter (HOSPITAL_COMMUNITY): Payer: Self-pay

## 2018-05-20 DIAGNOSIS — N760 Acute vaginitis: Secondary | ICD-10-CM | POA: Diagnosis present

## 2018-05-20 MED ORDER — METRONIDAZOLE 500 MG PO TABS: 500.0000 mg | ORAL_TABLET | Freq: Two times a day (BID) | ORAL | 0 refills | Status: DC

## 2018-05-20 MED ORDER — FLUCONAZOLE 150 MG PO TABS: 150.0000 mg | ORAL_TABLET | Freq: Every day | ORAL | 0 refills | Status: DC

## 2018-05-20 NOTE — Discharge Instructions (Addendum)
We will go ahead and treat you today for bacterial vaginosis and yeast infection based on symptoms and history We will send the swab for testing and call you with any positive results

## 2018-05-20 NOTE — ED Triage Notes (Signed)
Pt presents with vaginal irritation, foul odor, and discharge for past 2 weeks.

## 2018-05-20 NOTE — ED Provider Notes (Signed)
[facility]    CSN: [id number] Arrival date & time: 05/20/18  1000     History   Chief Complaint Chief Complaint  Patient presents with  . Vaginitis  . BV    HPI Ellen Booker is a 38 y.o. female.   Patient is a 38 year old female with past medical history of vaginitis and BV.  She presents with approximately 2 weeks of vaginal discharge, odor, irritation.  Denies any significant itching.  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  Now the symptoms have returned.  She denies any associated abdominal pain, back pain, pelvic pain, fevers, chills, dysuria, hematuria, urinary urgency.  She is currently sexually active with her husband.  No concern for STDs.  She is not currently taking any birth control.  ROS per HPI      Past Medical History:  Diagnosis Date  . Abdominal wall mass    left side  . Depression   . History of Helicobacter pylori infection    per pt 2016  . Renal cyst, right    per CT 04-04-2016  . Wears glasses     Patient Active Problem List   Diagnosis Date Noted  . Dysmenorrhea 02/01/2014  . Tobacco dependency 02/01/2014  . Obesity 12/09/2013  . Essential hypertension, benign 12/09/2013  . Candidiasis of vulva and vagina 12/08/2013  . Bilateral nipple discharge 11/08/2013  . 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 Unspecified disorder of menstruation and other abnormal bleeding from female genital tract 11/08/2013  . Irregular menstrual cycle 11/08/2013  . UTI (lower urinary tract infection) 01/29/2013  . 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 Well woman exam with routine gynecological exam 01/26/2013  . BMI 40.0-44.9, adult (HCC) 01/26/2013  . Elevated BP 01/26/2013    Past Surgical History:  Procedure Laterality Date  . CESAREAN SECTION  2010  . COLONOSCOPY  last one 06-19-2015  . EXCISION ABDOMINAL WALL MASS  2012   right side  . EXCISION MASS ABDOMINAL N/A 05/21/2016   Procedure:  EXCISION ABDOMINAL WALL MASS;  Surgeon: De Blanch Kinsinger, MD;  Location: [facility];  Service: General;  Laterality: N/A;  . RESECTION OF ABDOMINAL MASS      OB History    Gravida  1   Para  1   Term  1   Preterm      AB      Living  1     SAB      TAB      Ectopic      Multiple      Live Births  1            Home Medications    Prior to Admission medications   Medication Sig Start Date End Date Taking? Authorizing Provider  buPROPion (WELLBUTRIN XL) 150 MG 24 hr tablet Take 150 mg by mouth daily.    [provider]  fluconazole (DIFLUCAN) 150 MG tablet Take 1 tablet (150 mg total) by mouth daily. 05/20/18   Dahlia Byes A, NP  FLUoxetine (PROZAC) 20 MG tablet Take 20 mg by mouth 2 (two) times daily.    [provider]  lamoTRIgine (LAMICTAL) 100 MG tablet Take 100 mg by mouth 2 (two) times daily.     [provider]  metroNIDAZOLE (FLAGYL) 500 MG tablet Take 1 tablet (500 mg total) by mouth 2 (two) times daily. 05/20/18   Dahlia Byes A, NP  phentermine (ADIPEX-P) 37.5  MG tablet Take 37.5 mg by mouth daily before breakfast.    [provider]    Family History Family History  Problem Relation Age of Onset  . HIV Father   . Uterine cancer Other     Social History Social History   Tobacco Use  . Smoking status: Current Every Day Smoker    Packs/day: 0.50    Years: 19.00    Pack years: 9.50    Types: Cigarettes  . Smokeless tobacco: Never Used  Substance Use Topics  . Alcohol use: No    Alcohol/week: 0.0 standard drinks  . Drug use: No     Allergies   Patient has no known allergies.   Review of Systems Review of Systems   Physical Exam Triage Vital Signs ED Triage Vitals  Enc Vitals Group     BP 05/20/18 1033 (!) 116/59     Pulse Rate 05/20/18 1033 (!) 103     Resp 05/20/18 1033 18     Temp 05/20/18 1033 97.7 F (36.5 C)     Temp Source 05/20/18 1033 Oral     SpO2 05/20/18 1033 99  %     Weight --      Height --      Head Circumference --      Peak Flow --      Pain Score 05/20/18 1037 5     Pain Loc --      Pain Edu? --      Excl. in GC? --    No data found.  Updated Vital Signs BP (!) 116/59 (BP Location: Right Arm)   Pulse (!) 103   Temp 97.7 F (36.5 C) (Oral)   Resp 18   LMP 04/26/2018   SpO2 99%   Visual Acuity Right Eye Distance:   Left Eye Distance:   Bilateral Distance:    Right Eye Near:   Left Eye Near:    Bilateral Near:     Physical Exam Vitals signs and nursing note reviewed.  Constitutional:      General: She is not in acute distress.    Appearance: Normal appearance. She is well-developed. She is not ill-appearing, toxic-appearing or diaphoretic.  HENT:     Head: Normocephalic and atraumatic.     Nose: Nose normal.  Eyes:     Conjunctiva/sclera: Conjunctivae normal.  Abdominal:     Palpations: Abdomen is soft.     Tenderness: There is no abdominal tenderness.  Musculoskeletal: Normal range of motion.  Skin:    General: Skin is warm and dry.  Neurological:     Mental Status: She is alert.  Psychiatric:        Mood and Affect: Mood normal.      UC Treatments / Results  Labs (all labs ordered are listed, but only abnormal results are displayed) Labs Reviewed  CERVICOVAGINAL ANCILLARY ONLY    EKG None  Radiology No results found.  Procedures Procedures (including critical care time)  Medications Ordered in UC Medications - No data to display  Initial Impression / Assessment and Plan / UC Course  I have reviewed the triage vital signs and the nursing notes.  Pertinent labs & imaging results that were available during my care of the patient were reviewed by me and considered in my medical decision making (see chart for details).     We will go ahead and treat for bacterial vaginosis and yeast based on patient's history and symptoms Sending self swab for testing we  will call with any positive  results   Final Clinical Impressions(s) / UC Diagnoses   Final diagnoses:  Vaginitis and vulvovaginitis     Discharge Instructions     We will go ahead and treat you today for bacterial vaginosis and yeast infection based on symptoms and history We will send the swab for testing and call you with any positive results    ED Prescriptions    Medication Sig Dispense Auth. Provider   fluconazole (DIFLUCAN) 150 MG tablet Take 1 tablet (150 mg total) by mouth daily. 2 tablet Kamylah Manzo A, NP   metroNIDAZOLE (FLAGYL) 500 MG tablet Take 1 tablet (500 mg total) by mouth 2 (two) times daily. 14 tablet Dahlia ByesBast, Elio Haden A, NP     Controlled Substance Prescriptions [place] Controlled Substance Registry consulted? Not Applicable   Janace ArisBast, Romy Mcgue A, NP 05/20/18 1114

## 2018-05-21 LAB — CERVICOVAGINAL ANCILLARY ONLY
BACTERIAL VAGINITIS: NEGATIVE
Candida vaginitis: NEGATIVE
Chlamydia: NEGATIVE
Neisseria Gonorrhea: NEGATIVE
Trichomonas: NEGATIVE

## 2018-07-27 ENCOUNTER — Telehealth: Payer: No Typology Code available for payment source | Admitting: Physician Assistant

## 2018-07-27 ENCOUNTER — Encounter: Payer: Self-pay | Admitting: Physician Assistant

## 2018-07-27 DIAGNOSIS — R197 Diarrhea, unspecified: Secondary | ICD-10-CM

## 2018-07-27 DIAGNOSIS — R0602 Shortness of breath: Secondary | ICD-10-CM

## 2018-07-27 DIAGNOSIS — R51 Headache: Secondary | ICD-10-CM

## 2018-07-27 DIAGNOSIS — R519 Headache, unspecified: Secondary | ICD-10-CM

## 2018-07-27 DIAGNOSIS — R52 Pain, unspecified: Secondary | ICD-10-CM

## 2018-07-27 MED ORDER — ALBUTEROL SULFATE HFA 108 (90 BASE) MCG/ACT IN AERS
2.0000 | INHALATION_SPRAY | RESPIRATORY_TRACT | 0 refills | Status: DC | PRN
Start: 2018-07-27 — End: 2020-07-31

## 2018-07-27 NOTE — Progress Notes (Signed)
E-Visit for Corona Virus Screening  Based on your current symptoms, you may very well have the virus, however your symptoms are mild. Currently, not all patients are being tested. If the symptoms are mild and there is not a known exposure, performing the test is not indicated.  You have been enrolled in MyChart Home Monitoring for COVID-19. Daily you will receive a questionnaire within the MyChart website. Our COVID-19 response team will be monitoring your responses daily.   Coronavirus disease 2019 (COVID-19) is a respiratory illness that can spread from person to person. The virus that causes COVID-19 is a new virus that was first identified in the country of [place] but is now found in multiple other countries and has spread to the [place].  Symptoms associated with the virus are mild to severe fever, cough, and shortness of breath. There is currently no vaccine to protect against COVID-19, and there is no specific antiviral treatment for the virus.   To be considered HIGH RISK for Coronavirus (COVID-19), you have to meet the following criteria:  . Traveled to [place], [place], [place], [place] or [place]; or in the [place] to [place], [place], [place], or [place]; and have fever, cough, and shortness of breath within the last 2 weeks of travel OR  . Been in close contact with a person diagnosed with COVID-19 within the last 2 weeks and have fever, cough, and shortness of breath  . IF YOU DO NOT MEET THESE CRITERIA, YOU ARE CONSIDERED LOW RISK FOR COVID-19.   It is vitally important that if you feel that you have an infection such as this virus or any other virus that you stay home and away from places where you may spread it to others.  You should self-quarantine for 14 days if you have symptoms that could potentially be coronavirus and avoid contact with people age 89 and older.   You can use medication such as A prescription inhaler called Albuterol MDI 90 mcg /actuation 2 puffs  every 4 hours as needed for shortness of breath, wheezing, cough  You may also take acetaminophen (Tylenol) as needed for fever.  I have also provided a work note   Reduce your risk of any infection by using the same precautions used for avoiding the common cold or flu:  Marland Kitchen Wash your hands often with soap and warm water for at least 20 seconds.  If soap and water are not readily available, use an alcohol-based hand sanitizer with at least 60% alcohol.  . If coughing or sneezing, cover your mouth and nose by coughing or sneezing into the elbow areas of your shirt or coat, into a tissue or into your sleeve (not your hands). . Avoid shaking hands with others and consider head nods or verbal greetings only. . Avoid touching your eyes, nose, or mouth with unwashed hands.  . Avoid close contact with people who are sick. . Avoid places or events with large numbers of people in one location, like concerts or sporting events. . Carefully consider travel plans you have or are making. . If you are planning any travel outside or inside the [place], visit the CDC's Travelers' Health webpage for the latest health notices. . If you have some symptoms but not all symptoms, continue to monitor at home and seek medical attention if your symptoms worsen. . If you are having a medical emergency, call 911.  HOME CARE . Only take medications as instructed by your medical team. . Drink  plenty of fluids and get plenty of rest. . A steam or ultrasonic humidifier can help if you have congestion.   GET HELP RIGHT AWAY IF: . You develop worsening fever. . You become short of breath . You cough up blood. . Your symptoms become more severe MAKE SURE YOU   Understand these instructions.  Will watch your condition.  Will get help right away if you are not doing well or get worse.  Your e-visit answers were reviewed by a board certified advanced clinical practitioner to complete your personal care plan.  Depending on  the condition, your plan could have included both over the counter or prescription medications.  If there is a problem please reply once you have received a response from your provider. Your safety is important to us.  If you have drug allergies check your prescription carefully.    You can use MyChart to ask questions about today's visit, request a non-urgent call back, or ask for a work or school excuse for 24 hours related to this e-Visit. If it has been greater than 24 hours you will need to follow up with your provider, or enter a new e-Visit to address those concerns. You will get an e-mail in the next two days asking about your experience.  I hope that your e-visit has been valuable and will speed your recovery. Thank you for using e-visit.  I have spent 7 min in completion and review of this note- Illa LevelSahar Osman [facility]

## 2018-08-01 ENCOUNTER — Telehealth: Payer: No Typology Code available for payment source | Admitting: Physician Assistant

## 2018-08-01 DIAGNOSIS — B9689 Other specified bacterial agents as the cause of diseases classified elsewhere: Secondary | ICD-10-CM

## 2018-08-01 DIAGNOSIS — N760 Acute vaginitis: Secondary | ICD-10-CM

## 2018-08-01 MED ORDER — METRONIDAZOLE 500 MG PO TABS: 500.0000 mg | ORAL_TABLET | Freq: Three times a day (TID) | ORAL | 0 refills | Status: DC

## 2018-08-01 NOTE — Progress Notes (Signed)
We are sorry that you are not feeling well. Here is how we plan to help! Based on what you shared with me it looks like you: May have a yeast vaginosis   Vaginosis is an inflammation of the vagina that can result in discharge, itching and pain. The cause is usually a change in the normal balance of vaginal bacteria or an infection. Vaginosis can also result from reduced estrogen levels after menopause.  The most common causes of vaginosis are:   Bacterial vaginosis which results from an overgrowth of one on several organisms that are normally present in your vagina.   Yeast infections which are caused by a naturally occurring fungus called candida.   Vaginal atrophy (atrophic vaginosis) which results from the thinning of the vagina from reduced estrogen levels after menopause.   Trichomoniasis which is caused by a parasite and is commonly transmitted by sexual intercourse.  Factors that increase your risk of developing vaginosis include: Medications, such as antibiotics and steroids Uncontrolled diabetes Use of hygiene products such as bubble bath, vaginal spray or vaginal deodorant Douching Wearing damp or tight-fitting clothing Using an intrauterine device (IUD) for birth control Hormonal changes, such as those associated with pregnancy, birth control pills or menopause Sexual activity Having a sexually transmitted infection  Your treatment plan is Metronidazole or Flagyl 500mg  twice a day for 7 days.  I have electronically sent this prescription into the pharmacy that you have chosen.  Be sure to take all of the medication as directed. Stop taking any medication if you develop a rash, tongue swelling or shortness of breath. Mothers who are breast feeding should consider pumping and discarding their breast milk while on these antibiotics. However, there is no consensus that infant exposure at these doses would be harmful.  Remember that medication creams can weaken latex  condoms. Marland Kitchen   HOME CARE:  Good hygiene may prevent some types of vaginosis from recurring and may relieve some symptoms:  Avoid baths, hot tubs and whirlpool spas. Rinse soap from your outer genital area after a shower, and dry the area well to prevent irritation. Don't use scented or harsh soaps, such as those with deodorant or antibacterial action. Avoid irritants. These include scented tampons and pads. Wipe from front to back after using the toilet. Doing so avoids spreading fecal bacteria to your vagina.  Other things that may help prevent vaginosis include:  Don't douche. Your vagina doesn't require cleansing other than normal bathing. Repetitive douching disrupts the normal organisms that reside in the vagina and can actually increase your risk of vaginal infection. Douching won't clear up a vaginal infection. Use a latex condom. Both female and female latex condoms may help you avoid infections spread by sexual contact. Wear cotton underwear. Also wear pantyhose with a cotton crotch. If you feel comfortable without it, skip wearing underwear to bed. Yeast thrives in [employer] Your symptoms should improve in the next day or two.  GET HELP RIGHT AWAY IF:  You have pain in your lower abdomen ( pelvic area or over your ovaries) You develop nausea or vomiting You develop a fever Your discharge changes or worsens You have persistent pain with intercourse You develop shortness of breath, a rapid pulse, or you faint.  These symptoms could be signs of problems or infections that need to be evaluated by a medical provider now.  MAKE SURE YOU   Understand these instructions. Will watch your condition. Will get help right away if you are not doing  well or get worse.  Your e-visit answers were reviewed by a board certified advanced clinical practitioner to complete your personal care plan. Depending upon the condition, your plan could have included both over the counter or  prescription medications. Please review your pharmacy choice to make sure that you have choses a pharmacy that is open for you to pick up any needed prescription, Your safety is important to us. If you have drug allergies check your prescription carefully.   You can use MyChart to ask questions about today's visit, request a non-urgent call back, or ask for a work or school excuse for 24 hours related to this e-Visit. If it has been greater than 24 hours you will need to follow up with your provider, or enter a new e-Visit to address those concerns. You will get a MyChart message within the next two days asking about your experience. I hope that your e-visit has been valuable and will speed your recovery.  ===View-only below this line===   ----- Message -----    From: Jerene CannyVanessa L Jacobs    Sent: 08/01/2018  1:12 PM EDT      To: E-Visit Mailing List Subject: E-Visit Submission: Vaginal Symptoms  E-Visit Submission: Vaginal Symptoms --------------------------------  Question: Which of the following are you experiencing? Answer:     Question: Are you having pain while passing urine? Answer:   No, I have no pain while urinating  Question: Which of the following applies to your vaginal discharge Answer:   I have a clear discharge            I have a foul-smelling discharge  Question: Are you pregnant? Answer:   I am confident that I am not pregnant  Question: Are you breastfeeding? Answer:   No  Question: Which of the following are you experiencing? Answer:   None of the above  Question: Do you have any sores on your genitals? Answer:   No  Question: Have you taken antibiotics recently? Answer:   I have not been on any antibiotics  Question: Do you do any of the following? Answer:   None of the above  Question: Which of the following applies to your menstrual period? Answer:   I expect to have a menstrual period soon  Question: Have you had similar symptoms in the  past? Answer:   Yes, I have had similar symptoms before  Question: When you had similar symptoms in  the past, did any of the following work? Answer:   Other  Question: Please specify what other treatments worked for you in the past Answer:   Metrodonizal  Question: Do you have a fever? Answer:   No, I do not have a fever  Question: During the past 2 months, have you had sexual contact with a specific person for the first time? Answer:   No  Question: Has a person with whom you have had sexual contact been recently told they have a disease possibly acquired through sex? Answer:   No  Question: Please list your medication allergies that you may have ? (If 'none' , please list as 'none') Answer:   None  Question: Please list any additional comments  Answer:   I have reoccurring bv  A total of 5-10 minutes was spent evaluating this patients questionnaire and formulating a plan of care.   Lab Results  Component Value Date   CREATININE 0.81 11/21/2017   CREATININE 0.71 07/22/2016   CREATININE 0.70 06/17/2016

## 2018-08-13 ENCOUNTER — Telehealth: Payer: No Typology Code available for payment source | Admitting: Physician Assistant

## 2018-08-13 DIAGNOSIS — N760 Acute vaginitis: Secondary | ICD-10-CM

## 2018-08-13 NOTE — Progress Notes (Signed)
Based on what you shared with me, I feel your condition warrants further evaluation and I recommend that you be seen for a face to face office visit.  Giving ongoing symptoms despite rounds of treatment, you need to be seen for a vaginal examination and urinary testing to make sure the appropriate treatment is being given   NOTE: If you entered your credit card information for this eVisit, you will not be charged. You may see a "hold" on your card for the $35 but that hold will drop off and you will not have a charge processed.  If you are having a true medical emergency please call 911.  If you need an urgent face to face visit, [place] has four urgent care centers for your convenience.    PLEASE NOTE: THE [facility] LOCATIONS AND URGENT CARE CLINICS DO NOT HAVE THE TESTING FOR CORONAVIRUS COVID19 AVAILABLE.  IF YOU FEEL YOU NEED THIS TEST YOU MUST GO TO A TRIAGE LOCATION AT ONE OF THE HOSPITAL EMERGENCY DEPARTMENTS   [email or web address] to reserve your spot online an avoid wait times  [facility] [address] [place], [place] [address] Modified hours of operation: Monday-Friday, 12 PM to 6 PM  Saturday & Sunday 10 AM to 4 PM *Across the street from [employer]  [facility] (New Address!) [address] [place], [place] [address] *Just off [employer], across the road from [place]* Modified hours of operation: Monday-Friday, 12 PM to 6 PM  Closed Saturday & Sunday  [facility]'s modified hours of operation will be in effect from May 1 until May 31   The following sites will take your insurance:  . [facility]  [phone] Get Driving Directions Find a Provider at this Location  [address] [place], [place] [address] . 10 am to 8 pm Monday-Friday . 12 pm to 8 pm Saturday-Sunday   . [facility] at [facility]  [phone] Get Driving Directions Find a  Provider at this Location  [address] [place], [place] [address] . 8 am to 8 pm Monday-Friday . 9 am to 6 pm Saturday . 11 am to 6 pm Sunday   . [facility] at [facility]  [phone] Get Driving Directions  [address] [place], [place] [address] . 8 am to 8 pm Monday-Friday . 8 am to 4 pm Saturday-Sunday   Your e-visit answers were reviewed by a board certified advanced clinical practitioner to complete your personal care plan.  Thank you for using e-Visits.

## 2018-09-08 ENCOUNTER — Telehealth: Payer: No Typology Code available for payment source | Admitting: Nurse Practitioner

## 2018-09-08 DIAGNOSIS — N898 Other specified noninflammatory disorders of vagina: Secondary | ICD-10-CM

## 2018-09-08 NOTE — Progress Notes (Signed)
Had to contact patient  Because I did not understand what she was asking for. She said that she has no urinary problems and no vaginal discharge but she says that she has a very foul odor during and after intercourse. She also has smell when she is wiping after urinating. She had this problem the may 6,2020 and was treated. She did another evisit on may 21,2020 and was told she needed a face to face visit. I told her that I felt she needed a face to face visit in order to determine cause of her issue since she has already been treated. She said she would contact urgent care next to [facility] hospital to see if they could treat her. She has tried to see a [facility] office but they have all told her that they were not taking new patients at this time.    I discussed the assessment and treatment plan with the patient. The patient was provided an opportunity to ask questions and all were answered. The patient agreed with the plan and demonstrated an understanding of the instructions.  Mary-Margaret Hassell Done, FNP

## 2018-10-05 ENCOUNTER — Other Ambulatory Visit (HOSPITAL_COMMUNITY)
Admission: RE | Admit: 2018-10-05 | Discharge: 2018-10-05 | Disposition: A | Discharge status: Home / Self Care | Payer: Medicaid Other | Source: Ambulatory Visit | Attending: Obstetrics | Admitting: Obstetrics

## 2018-10-05 ENCOUNTER — Ambulatory Visit: Payer: Medicaid Other | Admitting: Obstetrics

## 2018-10-05 ENCOUNTER — Encounter: Payer: Self-pay | Admitting: Obstetrics

## 2018-10-05 ENCOUNTER — Other Ambulatory Visit: Payer: Self-pay

## 2018-10-05 VITALS — BP 129/89 | HR 105 | Temp 99.5°F | Ht 64.0 in | Wt 256.4 lb

## 2018-10-05 DIAGNOSIS — Z3202 Encounter for pregnancy test, result negative: Secondary | ICD-10-CM

## 2018-10-05 DIAGNOSIS — Z01419 Encounter for gynecological examination (general) (routine) without abnormal findings: Secondary | ICD-10-CM

## 2018-10-05 DIAGNOSIS — N898 Other specified noninflammatory disorders of vagina: Secondary | ICD-10-CM

## 2018-10-05 DIAGNOSIS — Z3169 Encounter for other general counseling and advice on procreation: Secondary | ICD-10-CM

## 2018-10-05 DIAGNOSIS — Z113 Encounter for screening for infections with a predominantly sexual mode of transmission: Secondary | ICD-10-CM

## 2018-10-05 DIAGNOSIS — F17210 Nicotine dependence, cigarettes, uncomplicated: Secondary | ICD-10-CM

## 2018-10-05 DIAGNOSIS — N939 Abnormal uterine and vaginal bleeding, unspecified: Secondary | ICD-10-CM

## 2018-10-05 DIAGNOSIS — E66813 Obesity, class 3: Secondary | ICD-10-CM

## 2018-10-05 DIAGNOSIS — N839 Noninflammatory disorder of ovary, fallopian tube and broad ligament, unspecified: Secondary | ICD-10-CM

## 2018-10-05 DIAGNOSIS — Z0000 Encounter for general adult medical examination without abnormal findings: Secondary | ICD-10-CM

## 2018-10-05 LAB — POCT URINE PREGNANCY: Preg Test, Ur: NEGATIVE

## 2018-10-05 MED ORDER — VARENICLINE TARTRATE 1 MG PO TABS: 1.0000 mg | ORAL_TABLET | Freq: Two times a day (BID) | ORAL | 1 refills | Status: DC

## 2018-10-05 MED ORDER — PRENATE MINI 29-0.6-0.4-350 MG PO CAPS: 1.0000 | ORAL_CAPSULE | Freq: Every day | ORAL | 11 refills | Status: DC

## 2018-10-05 MED ORDER — NUVESSA 1.3 % VA GEL: 1.0000 | Freq: Every evening | VAGINAL | 5 refills | Status: DC | PRN

## 2018-10-05 MED ORDER — CHANTIX STARTING MONTH PAK 0.5 MG X 11 & 1 MG X 42 PO TABS: ORAL_TABLET | ORAL | 0 refills | Status: DC

## 2018-10-05 NOTE — Patient Instructions (Signed)
Abnormal Uterine Bleeding Abnormal uterine bleeding is unusual bleeding from the uterus. It includes:  Bleeding or spotting between periods.  Bleeding after sex.  Bleeding that is heavier than normal.  Periods that last longer than usual.  Bleeding after menopause. Abnormal uterine bleeding can affect women at various stages in life, including teenagers, women in their reproductive years, pregnant women, and women who have reached menopause. Common causes of abnormal uterine bleeding include:  Pregnancy.  Growths of tissue (polyps).  A noncancerous tumor in the uterus (fibroid).  Infection.  Cancer.  Hormonal imbalances. Any type of abnormal bleeding should be evaluated by a health care provider. Many cases are minor and simple to treat, while others are more serious. Treatment will depend on the cause of the bleeding. Follow these instructions at home:  Monitor your condition for any changes.  Do not use tampons, douche, or have sex if told by your health care provider.  Change your pads often.  Get regular exams that include pelvic exams and cervical cancer screening.  Keep all follow-up visits as told by your health care provider. This is important. Contact a health care provider if:  Your bleeding lasts for more than one week.  You feel dizzy at times.  You feel nauseous or you vomit. Get help right away if:  You pass out.  Your bleeding soaks through a pad every hour.  You have abdominal pain.  You have a fever.  You become sweaty or weak.  You pass large blood clots from your vagina. Summary  Abnormal uterine bleeding is unusual bleeding from the uterus.  Any type of abnormal bleeding should be evaluated by a health care provider. Many cases are minor and simple to treat, while others are more serious.  Treatment will depend on the cause of the bleeding. This information is not intended to replace advice given to you by your health care provider.  Make sure you discuss any questions you have with your health care provider. Document Released: 03/11/2005 Document Revised: 06/18/2017 Document Reviewed: 04/12/2016 Elsevier Patient Education  2020 [employer]  Endometrial Biopsy  Endometrial biopsy is a procedure in which a tissue sample is taken from inside the uterus. The sample is taken from the endometrium, which is the lining of the uterus. The tissue sample is then checked under a microscope to see if the tissue is normal or abnormal. This procedure helps to determine where you are in your menstrual cycle and how hormone levels are affecting the lining of the uterus. This procedure may also be used to evaluate uterine bleeding or to diagnose endometrial cancer, endometrial tuberculosis, polyps, or other inflammatory conditions. Tell a health care provider about:  Any allergies you have.  All medicines you are taking, including vitamins, herbs, eye drops, creams, and over-the-counter medicines.  Any problems you or family members have had with anesthetic medicines.  Any blood disorders you have.  Any surgeries you have had.  Any medical conditions you have.  Whether you are pregnant or may be pregnant. What are the risks? Generally, this is a safe procedure. However, problems may occur, including:  Bleeding.  Pelvic infection.  Puncture of the wall of the uterus with the biopsy device (rare). What happens before the procedure?  Keep a record of your menstrual cycles as told by your health care provider. You may need to schedule your procedure for a specific time in your cycle.  You may want to bring a sanitary pad to wear after the procedure.  Ask your health care provider about: ? Changing or stopping your regular medicines. This is especially important if you are taking diabetes medicines or blood thinners. ? Taking medicines such as aspirin and ibuprofen. These medicines can thin your blood. Do not take these  medicines before your procedure if your health care provider instructs you not to.  Plan to have someone take you home from the hospital or clinic. What happens during the procedure?  To lower your risk of infection: ? Your health care team will wash or sanitize their hands.  You will lie on an exam table with your feet and legs supported as in a pelvic exam.  Your health care provider will insert an instrument (speculum) into your vagina to see your cervix.  Your cervix will be cleansed with an antiseptic solution.  A medicine (local anesthetic) will be used to numb the cervix.  A forceps instrument (tenaculum) will be used to hold your cervix steady for the biopsy.  A thin, rod-like instrument (uterine sound) will be inserted through your cervix to determine the length of your uterus and the location where the biopsy sample will be removed.  A thin, flexible tube (catheter) will be inserted through your cervix and into the uterus. The catheter will be used to collect the biopsy sample from your endometrial tissue.  The catheter and speculum will then be removed, and the tissue sample will be sent to a lab for examination. What happens after the procedure?  You will rest in a recovery area until you are ready to go home.  You may have mild cramping and a small amount of vaginal bleeding. This is normal.  It is up to you to get the results of your procedure. Ask your health care provider, or the department that is doing the procedure, when your results will be ready. Summary  Endometrial biopsy is a procedure in which a tissue sample is taken from the endometrium, which is the lining of the uterus.  This procedure may help to diagnose menstrual cycle problems, abnormal bleeding, or other conditions affecting the endometrium.  Before the procedure, keep a record of your menstrual cycles as told by your health care provider.  The tissue sample that is removed will be checked under  a microscope to see if it is normal or abnormal. This information is not intended to replace advice given to you by your health care provider. Make sure you discuss any questions you have with your health care provider. Document Released: 07/12/2004 Document Revised: 02/21/2017 Document Reviewed: 03/27/2016 Elsevier Patient Education  2020 [employer].

## 2018-10-05 NOTE — Progress Notes (Signed)
Subjective:        Ellen Booker is a 38 y.o. female here for a routine exam.  Current complaints: Spotting in between periods.    Personal health questionnaire:  Is patient Ellen Booker, have a family history of breast and/or ovarian cancer: no Is there a family history of uterine cancer diagnosed at age < 28, gastrointestinal cancer, urinary tract cancer, family member who is a Field seismologist syndrome-associated carrier: yes Is the patient overweight and hypertensive, family history of diabetes, personal history of gestational diabetes, preeclampsia or PCOS: no Is patient over 64, have PCOS,  family history of premature CHD under age 71, diabetes, smoke, have hypertension or peripheral artery disease:  yes At any time, has a partner hit, kicked or otherwise hurt or frightened you?: no Over the past 2 weeks, have you felt down, depressed or hopeless?: no Over the past 2 weeks, have you felt little interest or pleasure in doing things?:no   Gynecologic History Patient's last menstrual period was 09/23/2018. Contraception: none Last Pap: 2015. Results were: normal Last mammogram: N/A. Results were: N/A  Obstetric History OB History  Gravida Para Term Preterm AB Living  1 1 1     1   SAB TAB Ectopic Multiple Live Births          1    # Outcome Date GA Lbr Len/2nd Weight Sex Delivery Anes PTL Lv  1 Term  [redacted]w[redacted]d    CS-LTranv   LIV    Past Medical History:  Diagnosis Date  . Abdominal wall mass    left side  . Depression   . History of Helicobacter pylori infection    per pt 2016  . Renal cyst, right    per CT 04-04-2016  . Wears glasses     Past Surgical History:  Procedure Laterality Date  . CESAREAN SECTION  2010  . COLONOSCOPY  last one 06-19-2015  . EXCISION ABDOMINAL WALL MASS  2012   right side  . EXCISION MASS ABDOMINAL N/A 05/21/2016   Procedure: EXCISION ABDOMINAL WALL MASS;  Surgeon: Arta Bruce Kinsinger, MD;  [facility];  Service:  General;  Laterality: N/A;  . RESECTION OF ABDOMINAL MASS       Current Outpatient Medications:  .  DULoxetine (CYMBALTA) 60 MG capsule, TAKE 1 CAPSULE BY MOUTH TWICE DAILY FOR DEPRESSION ANXIETY, Disp: , Rfl:  .  Ferrous Sulfate (IRON PO), Take by mouth., Disp: , Rfl:  .  lamoTRIgine (LAMICTAL) 100 MG tablet, Take 150 mg by mouth 2 (two) times daily. , Disp: , Rfl:  .  Levomilnacipran HCl (FETZIMA PO), Take 40 mg by mouth., Disp: , Rfl:  .  Probiotic Product (PROBIOTIC-10 PO), Take by mouth., Disp: , Rfl:  .  topiramate (TOPAMAX) 100 MG tablet, TAKE 1 TABLET BY MOUTH AT BEDTIME FOR MOOD WEIGH, Disp: , Rfl:  .  albuterol (VENTOLIN HFA) 108 (90 Base) MCG/ACT inhaler, Inhale 2 puffs into the lungs every 4 (four) hours as needed for wheezing or shortness of breath. (Patient not taking: Reported on 10/05/2018), Disp: 1 Inhaler, Rfl: 0 .  buPROPion (WELLBUTRIN XL) 150 MG 24 hr tablet, Take 150 mg by mouth daily., Disp: , Rfl:  .  fluconazole (DIFLUCAN) 150 MG tablet, Take 1 tablet (150 mg total) by mouth daily., Disp: 2 tablet, Rfl: 0 .  FLUoxetine (PROZAC) 20 MG tablet, Take 20 mg by mouth 2 (two) times daily., Disp: , Rfl:  .  metroNIDAZOLE (FLAGYL) 500 MG tablet,  Take 1 tablet (500 mg total) by mouth 3 (three) times daily., Disp: 21 tablet, Rfl: 0 .  phentermine (ADIPEX-P) 37.5 MG tablet, Take 37.5 mg by mouth daily before breakfast., Disp: , Rfl:  No Known Allergies  Social History   Tobacco Use  . Smoking status: Current Every Day Smoker    Packs/day: 0.50    Years: 19.00    Pack years: 9.50    Types: Cigarettes  . Smokeless tobacco: Never Used  Substance Use Topics  . Alcohol use: No    Alcohol/week: 0.0 standard drinks    Family History  Problem Relation Age of Onset  . HIV Father   . Uterine cancer Other       Review of Systems  Constitutional: negative for fatigue and weight loss Respiratory: negative for cough and wheezing Cardiovascular: negative for chest pain, fatigue  and palpitations Gastrointestinal: negative for abdominal pain and change in bowel habits Musculoskeletal:negative for myalgias Neurological: negative for gait problems and tremors Behavioral/Psych: negative for abusive relationship, depression Endocrine: negative for temperature intolerance    Genitourinary:negative for abnormal menstrual periods, genital lesions, hot flashes, sexual problems and vaginal discharge Integument/breast: negative for breast lump, breast tenderness, nipple discharge and skin lesion(s)    Objective:       BP 129/89   Pulse (!) 105   Temp 99.5 F (37.5 C)   Ht 5\' 4"  (1.626 m)   Wt 256 lb 6.4 oz (116.3 kg)   LMP 09/23/2018   BMI 44.01 kg/m  General:   alert  Skin:   no rash or abnormalities  Lungs:   clear to auscultation bilaterally  Heart:   regular rate and rhythm, S1, S2 normal, no murmur, click, rub or gallop  Breasts:   normal without suspicious masses, skin or nipple changes or axillary nodes  Abdomen:  normal findings: no organomegaly, soft, non-tender and no hernia  Pelvis:  External genitalia: normal general appearance Urinary system: urethral meatus normal and bladder without fullness, nontender Vaginal: normal without tenderness, induration or masses Cervix: normal appearance Adnexa: normal bimanual exam Uterus: anteverted and non-tender, normal size   Lab Review Urine pregnancy test Labs reviewed yes Radiologic studies reviewed no  50% of 25 min visit spent on counseling and coordination of care.   Assessment:    Healthy female exam.    Plan:    Education reviewed: calcium supplements, depression evaluation, low fat, low cholesterol diet, safe sex/STD prevention, self breast exams, smoking cessation and weight bearing exercise. Contraception: none. Follow up in: 4 weeks.  Endometrial Biopsy   No orders of the defined types were placed in this encounter.  Orders Placed This Encounter  Procedures  . Hepatitis B surface  antigen  . Hepatitis C antibody  . HIV Antibody (routine testing w rflx)  . RPR  . POCT urine pregnancy    [name] 10-05-2018  Pt presents for annual c/o vaginal discharge with odor and she's experiencing intermenstrual spotting. Pt also trying to conceive.

## 2018-10-06 LAB — HEPATITIS C ANTIBODY: Hep C Virus Ab: <0.1 s/co ratio (ref 0.0–0.9)

## 2018-10-06 LAB — RPR: RPR Ser Ql: NONREACTIVE

## 2018-10-06 LAB — HEPATITIS B SURFACE ANTIGEN: Hepatitis B Surface Ag: NEGATIVE

## 2018-10-06 LAB — HIV ANTIBODY (ROUTINE TESTING W REFLEX): HIV Screen 4th Generation wRfx: NONREACTIVE

## 2018-10-07 ENCOUNTER — Other Ambulatory Visit: Payer: Self-pay | Admitting: Obstetrics

## 2018-10-07 DIAGNOSIS — B373 Candidiasis of vulva and vagina: Secondary | ICD-10-CM

## 2018-10-07 DIAGNOSIS — B3731 Acute candidiasis of vulva and vagina: Secondary | ICD-10-CM

## 2018-10-07 LAB — CYTOLOGY - PAP
Diagnosis: NEGATIVE
HPV: NOT DETECTED

## 2018-10-07 LAB — CERVICOVAGINAL ANCILLARY ONLY
Bacterial vaginitis: POSITIVE — AB
Candida vaginitis: POSITIVE — AB
Chlamydia: NEGATIVE
Neisseria Gonorrhea: NEGATIVE
Trichomonas: NEGATIVE

## 2018-10-07 MED ORDER — FLUCONAZOLE 150 MG PO TABS: 150.0000 mg | ORAL_TABLET | Freq: Once | ORAL | 0 refills | Status: AC

## 2018-10-22 ENCOUNTER — Ambulatory Visit (HOSPITAL_COMMUNITY): Payer: Medicaid Other

## 2018-10-30 ENCOUNTER — Other Ambulatory Visit: Payer: Self-pay

## 2018-10-30 ENCOUNTER — Encounter (INDEPENDENT_AMBULATORY_CARE_PROVIDER_SITE_OTHER): Payer: Self-pay

## 2018-10-30 ENCOUNTER — Telehealth: Payer: Self-pay | Admitting: *Deleted

## 2018-10-30 ENCOUNTER — Telehealth: Payer: Medicaid Other | Admitting: Physician Assistant

## 2018-10-30 DIAGNOSIS — R05 Cough: Secondary | ICD-10-CM

## 2018-10-30 DIAGNOSIS — R059 Cough, unspecified: Secondary | ICD-10-CM

## 2018-10-30 DIAGNOSIS — Z20822 Contact with and (suspected) exposure to covid-19: Secondary | ICD-10-CM

## 2018-10-30 MED ORDER — BENZONATATE 100 MG PO CAPS: 100.0000 mg | ORAL_CAPSULE | Freq: Three times a day (TID) | ORAL | 0 refills | Status: DC | PRN

## 2018-10-30 NOTE — Progress Notes (Signed)
E-Visit for Corona Virus Screening   Your current symptoms could be consistent with the coronavirus.  Many health care providers can now test patients at their office but not all are.  [place] has multiple testing sites. For information on our COVID testing locations and hours go to [email or web address]  Please quarantine yourself while awaiting your test results.  We are enrolling you in our [facility] [place] . Daily you will receive a questionnaire within the [facility] website. Our COVID 19 response team willl be monitoriing your responses daily.    COVID-19 is a respiratory illness with symptoms that are similar to the flu. Symptoms are typically mild to moderate, but there have been cases of severe illness and death due to the virus. The following symptoms may appear 2-14 days after exposure: . Fever . Cough . Shortness of breath or difficulty breathing . Chills . Repeated shaking with chills . Muscle pain . Headache . Sore throat . New loss of taste or smell . Fatigue . Congestion or runny nose . Nausea or vomiting . Diarrhea  It is vitally important that if you feel that you have an infection such as this virus or any other virus that you stay home and away from places where you may spread it to others.  You should self-quarantine for 14 days if you have symptoms that could potentially be coronavirus or have been in close contact a with a person diagnosed with COVID-19 within the last 2 weeks. You should avoid contact with people age 65 and older.   You should wear a mask or cloth face covering over your nose and mouth if you must be around other people or animals, including pets (even at home). Try to stay at least 6 feet away from other people. This will protect the people around you.  You can use medication such as A prescription cough medication called Tessalon Perles 100 mg. You may take 1-2 capsules every 8 hours as needed for  cough  You may also take acetaminophen (Tylenol) as needed for body aches and headache.    Reduce your risk of any infection by using the same precautions used for avoiding the common cold or flu:  Marland Kitchen Wash your hands often with soap and warm water for at least 20 seconds.  If soap and water are not readily available, use an alcohol-based hand sanitizer with at least 60% alcohol.  . If coughing or sneezing, cover your mouth and nose by coughing or sneezing into the elbow areas of your shirt or coat, into a tissue or into your sleeve (not your hands). . Avoid shaking hands with others and consider head nods or verbal greetings only. . Avoid touching your eyes, nose, or mouth with unwashed hands.  . Avoid close contact with people who are sick. . Avoid places or events with large numbers of people in one location, like concerts or sporting events. . Carefully consider travel plans you have or are making. . If you are planning any travel outside or inside the [place], visit the CDC's Travelers' Health webpage for the latest health notices. . If you have some symptoms but not all symptoms, continue to monitor at home and seek medical attention if your symptoms worsen. . If you are having a medical emergency, call 911.  HOME CARE . Only take medications as instructed by your medical team. . Drink plenty of fluids and get plenty of rest. . A steam or ultrasonic humidifier can help if you  have congestion.   GET HELP RIGHT AWAY IF YOU HAVE EMERGENCY WARNING SIGNS** FOR COVID-19. If you or someone is showing any of these signs seek emergency medical care immediately. Call 911 or proceed to your closest emergency facility if: . You develop worsening high fever. . Trouble breathing . Bluish lips or face . Persistent pain or pressure in the chest . New confusion . Inability to wake or stay awake . You cough up blood. . Your symptoms become more severe  **This list is not all possible symptoms. Contact  your medical provider for any symptoms that are sever or concerning to you.   MAKE SURE YOU   Understand these instructions.  Will watch your condition.  Will get help right away if you are not doing well or get worse.  Your e-visit answers were reviewed by a board certified advanced clinical practitioner to complete your personal care plan.  Depending on the condition, your plan could have included both over the counter or prescription medications.  If there is a problem please reply once you have received a response from your provider.  Your safety is important to us.  If you have drug allergies check your prescription carefully.    You can use MyChart to ask questions about today's visit, request a non-urgent call back, or ask for a work or school excuse for 24 hours related to this e-Visit. If it has been greater than 24 hours you will need to follow up with your provider, or enter a new e-Visit to address those concerns. You will get an e-mail in the next two days asking about your experience.  I hope that your e-visit has been valuable and will speed your recovery. Thank you for using e-visits.   I have spent 5 minutes in review of e-visit questionnaire, review and updating patient chart, medical decision making and response to patient.    Benjiman CoreBrittany Silva Aamodt, PA-C

## 2018-10-30 NOTE — Addendum Note (Signed)
Addended by: Tenna Delaine D on: 10/30/2018 01:39 PM   Modules accepted: Orders

## 2018-10-30 NOTE — Telephone Encounter (Signed)
Contacted pt due to [place] response 10/30/2018 of weakness; the pt says that she was moving furniture on yesterday; pt advised to prioritize, and space her activities, get adequate rest, and hydrate; she verbalized understanding.

## 2018-10-31 ENCOUNTER — Encounter (INDEPENDENT_AMBULATORY_CARE_PROVIDER_SITE_OTHER): Payer: Self-pay

## 2018-10-31 LAB — NOVEL CORONAVIRUS, NAA: SARS-CoV-2, NAA: NOT DETECTED

## 2018-11-02 ENCOUNTER — Other Ambulatory Visit: Payer: Medicaid Other | Admitting: Obstetrics

## 2018-11-04 ENCOUNTER — Other Ambulatory Visit: Payer: Self-pay

## 2018-11-04 ENCOUNTER — Ambulatory Visit (HOSPITAL_COMMUNITY)
Admission: RE | Admit: 2018-11-04 | Discharge: 2018-11-04 | Disposition: A | Discharge status: Home / Self Care | Payer: Medicaid Other | Source: Ambulatory Visit | Attending: Obstetrics | Admitting: Obstetrics

## 2018-11-04 DIAGNOSIS — N939 Abnormal uterine and vaginal bleeding, unspecified: Secondary | ICD-10-CM | POA: Insufficient documentation

## 2018-11-22 ENCOUNTER — Encounter

## 2018-11-24 ENCOUNTER — Other Ambulatory Visit: Payer: Self-pay

## 2018-11-24 ENCOUNTER — Ambulatory Visit: Payer: Medicaid Other | Admitting: Obstetrics & Gynecology

## 2018-11-24 VITALS — BP 137/84 | HR 90 | Wt 259.0 lb

## 2018-11-24 DIAGNOSIS — Z3169 Encounter for other general counseling and advice on procreation: Secondary | ICD-10-CM

## 2018-11-24 DIAGNOSIS — N923 Ovulation bleeding: Secondary | ICD-10-CM

## 2018-11-24 NOTE — Progress Notes (Signed)
Pt is here for follow up from previous visit.  Pt is having spotting between cycles, states she feels her cycles are coming closer together. Pt is unsure if she is to have or wants Endo Bx today.

## 2018-11-24 NOTE — Patient Instructions (Signed)
Female Infertility  Female infertility refers to a woman's inability to get pregnant (conceive) after a year of having sex regularly (or after 6 months in women over age 37) without using birth control. Infertility can also mean that a woman is not able to carry a pregnancy to full term. Both women and men can have fertility problems. What are the causes? This condition may be caused by:  Problems with reproductive organs. Infertility can result if a woman: ? Has an abnormally short cervix or a cervix that does not remain closed during a pregnancy. ? Has a blockage or scarring in the fallopian tubes. ? Has an abnormally shaped uterus. ? Has uterine fibroids. This is a benign mass of tissue or muscle (tumor) that can develop in the uterus. ? Is not ovulating in a regular way.  Certain medical conditions. These may include: ? Polycystic ovary syndrome (PCOS). This is a hormonal disorder that can cause small cysts to grow on the ovaries. This is the most common cause of infertility in women. ? Endometriosis. This is a condition in which the tissue that lines the uterus (endometrium) grows outside of its normal location. ? Cancer and cancer treatments, such as chemotherapy or radiation. ? Premature ovarian failure. This is when ovaries stop producing eggs and hormones before age 40. ? Sexually transmitted diseases, such as chlamydia or gonorrhea. ? Autoimmune disorders. These are disorders in which the body's defense system (immune system) attacks normal, healthy cells. Infertility can be linked to more than one cause. For some women, the cause of infertility is not known (unexplained infertility). What increases the risk?  Age. A woman's fertility declines with age, especially after her mid-30s.  Being underweight or overweight.  Drinking too much alcohol.  Using drugs such as anabolic steroids, cocaine, and marijuana.  Exercising excessively.  Being exposed to environmental toxins,  such as radiation, pesticides, and certain chemicals. What are the signs or symptoms? The main sign of infertility in women is the inability to get pregnant or carry a pregnancy to full term. How is this diagnosed? This condition may be diagnosed by:  Checking whether you are ovulating each month. The tests may include: ? Blood tests to check hormone levels. ? An ultrasound of the ovaries. ? Taking a small tissue that lines the uterus and checking it under a microscope (endometrial biopsy).  Doing additional tests. This is done if ovulation is normal. Tests may include: ? Hysterosalpingography. This X-ray test can show the shape of the uterus and whether the fallopian tubes are open. ? Laparoscopy. This test uses a lighted tube (laparoscope) to look for problems in the fallopian tubes and other organs. ? Transvaginal ultrasound. This imaging test is used to check for abnormalities in the uterus and ovaries. ? Hysteroscopy. This test uses a lighted tube to check for problems in the cervix and the uterus. To be diagnosed with infertility, both partners will have a physical exam. Both partners will also have an extensive medical and sexual history taken. Additional tests may be done. How is this treated? Treatment depends on the cause of infertility. Most cases of infertility in women are treated with medicine or surgery.  Women may take medicine to: ? Correct ovulation problems. ? Treat other health conditions.  Surgery may be done to: ? Repair damage to the ovaries, fallopian tubes, cervix, or uterus. ? Remove growths from the uterus. ? Remove scar tissue from the uterus, pelvis, or other organs. Assisted reproductive technology (ART) Assisted reproductive technology (  ART) refers to all treatments and procedures that combine eggs and sperm outside the body to try to help a couple conceive. ART is often combined with fertility drugs to stimulate ovulation. Sometimes ART is done using eggs  retrieved from another woman's body (donor eggs) or from previously frozen fertilized eggs (embryos). There are different types of ART. These include:  Intrauterine insemination (IUI). A long, thin tube is used to place sperm directly into a woman's uterus. This procedure: ? Is effective for infertility caused by sperm problems, including low sperm count and low motility. ? Can be used in combination with fertility drugs.  In vitro fertilization (IVF). This is done when a woman's fallopian tubes are blocked or when a man has low sperm count. In this procedure: ? Fertility drugs are used to stimulate the ovaries to produce multiple eggs. ? Once mature, these eggs are removed from the body and combined with the sperm to be fertilized. ? The fertilized eggs are then placed into the woman's uterus. Follow these instructions at home:  Take over-the-counter and prescription medicines only as told by your health care provider.  Do not use any products that contain nicotine or tobacco, such as cigarettes and e-cigarettes. If you need help quitting, ask your health care provider.  If you drink alcohol, limit how much you have to 1 drink a day.  Make dietary changes to lose weight or maintain a healthy weight. Work with your health care provider and a dietitian to set a weight-loss goal that is healthy and reasonable for you.  Seek support from a counselor or support group to talk about your concerns related to infertility. Couples counseling may be helpful for you and your partner.  Practice stress reduction techniques that work well for you, such as regular physical activity, meditation, or deep breathing.  Keep all follow-up visits as told by your health care provider. This is important. Contact a health care provider if you:  Feel that stress is interfering with your life and relationships.  Have side effects from treatments for infertility. Summary  Female infertility refers to a woman's  inability to get pregnant (conceive) after a year of having sex regularly (or after 6 months in women over age 37) without using birth control.  To be diagnosed with infertility, both partners will have a physical exam. Both partners will also have an extensive medical and sexual history taken.  Seek support from a counselor or support group to talk about your concerns related to infertility. Couples counseling may be helpful for you and your partner. This information is not intended to replace advice given to you by your health care provider. Make sure you discuss any questions you have with your health care provider. Document Released: 03/14/2003 Document Revised: 07/02/2018 Document Reviewed: 02/10/2017 Elsevier Patient Education  2020 Elsevier Inc.  

## 2018-11-24 NOTE — Progress Notes (Signed)
Patient ID: Ellen Booker, female   DOB: 06-16-1980, 38 y.o.   MRN: [id number]  Chief Complaint  Patient presents with  . Follow-up  occasional intermenstrual spotting  HPI Ellen Booker is a 38 y.o. female.  G1P1001 Patient's last menstrual period was 11/13/2018. Previous menses was 7/24, lasted 5 days. Occasional intermenstrual light spotting but this is infrequent. Dr Clearance CootsHarper ordered an US. She has wanted to conceive for at least 5 years with her current partner and had 2 Clomid cycles 1-2 years ago. HPI  Past Medical History:  Diagnosis Date  . Abdominal wall mass    left side  . Depression   . History of Helicobacter pylori infection    per pt 2016  . Renal cyst, right    per CT 04-04-2016  . Wears glasses     Past Surgical History:  Procedure Laterality Date  . CESAREAN SECTION  2010  . COLONOSCOPY  last one 06-19-2015  . EXCISION ABDOMINAL WALL MASS  2012   right side  . EXCISION MASS ABDOMINAL N/A 05/21/2016   Procedure: EXCISION ABDOMINAL WALL MASS;  Surgeon: De BlanchLuke Aaron Kinsinger, MD;  [facility];  Service: General;  Laterality: N/A;  . RESECTION OF ABDOMINAL MASS      Family History  Problem Relation Age of Onset  . HIV Father   . Uterine cancer Other     Social History Social History   Tobacco Use  . Smoking status: Current Every Day Smoker    Packs/day: 0.50    Years: 19.00    Pack years: 9.50    Types: Cigarettes  . Smokeless tobacco: Never Used  Substance Use Topics  . Alcohol use: No    Alcohol/week: 0.0 standard drinks  . Drug use: No    No Known Allergies  Current Outpatient Medications  Medication Sig Dispense Refill  . Ferrous Sulfate (IRON PO) Take by mouth.    . lamoTRIgine (LAMICTAL) 100 MG tablet Take 150 mg by mouth 2 (two) times daily.     . phentermine (ADIPEX-P) 37.5 MG tablet Take 37.5 mg by mouth daily before breakfast.    . Prenat w/o A-FeCbn-Meth-FA-DHA (PRENATE MINI) 29-0.6-0.4-350 MG CAPS  Take 1 capsule by mouth daily before breakfast. 30 capsule 11  . Probiotic Product (PROBIOTIC-10 PO) Take by mouth.    . varenicline (CHANTIX CONTINUING MONTH PAK) 1 MG tablet Take 1 tablet (1 mg total) by mouth 2 (two) times daily. 60 tablet 1  . vortioxetine HBr (TRINTELLIX) 20 MG TABS tablet Take 20 mg by mouth daily.    Marland Kitchen. albuterol (VENTOLIN HFA) 108 (90 Base) MCG/ACT inhaler Inhale 2 puffs into the lungs every 4 (four) hours as needed for wheezing or shortness of breath. (Patient not taking: Reported on 10/05/2018) 1 Inhaler 0  . benzonatate (TESSALON) 100 MG capsule Take 1 capsule (100 mg total) by mouth 3 (three) times daily as needed for cough. 20 capsule 0  . buPROPion (WELLBUTRIN XL) 150 MG 24 hr tablet Take 150 mg by mouth daily.    . DULoxetine (CYMBALTA) 60 MG capsule TAKE 1 CAPSULE BY MOUTH TWICE DAILY FOR DEPRESSION ANXIETY    . FLUoxetine (PROZAC) 20 MG tablet Take 20 mg by mouth 2 (two) times daily.    . Levomilnacipran HCl (FETZIMA PO) Take 40 mg by mouth.    . topiramate (TOPAMAX) 100 MG tablet TAKE 1 TABLET BY MOUTH AT BEDTIME FOR MOOD WEIGH    . varenicline (CHANTIX STARTING MONTH PAK) 0.5  MG X 11 & 1 MG X 42 tablet Take one 0.5 mg tablet by mouth once daily for 3 days, then increase to one 0.5 mg tablet twice daily for 4 days, then increase to one 1 mg tablet twice daily. 53 tablet 0   No current facility-administered medications for this visit.     Review of Systems Review of Systems  Constitutional: Positive for unexpected weight change (40 lb in 4 years).  HENT: Negative.   Respiratory: Negative.   Gastrointestinal: Negative.   Genitourinary: Positive for menstrual problem. Negative for pelvic pain, vaginal bleeding and vaginal discharge.    Blood pressure 137/84, pulse 90, weight 259 lb (117.5 kg), last menstrual period 11/13/2018.  Physical Exam Physical Exam Constitutional:      Appearance: She is obese. She is not ill-appearing.  Cardiovascular:     Rate  and Rhythm: Normal rate.  Pulmonary:     Effort: Pulmonary effort is normal.  Neurological:     Mental Status: She is alert.  Psychiatric:        Mood and Affect: Mood normal.        Behavior: Behavior normal.     Data Reviewed CLINICAL DATA:  Abnormal uterine bleeding.  Dyspareunia.  EXAM: TRANSABDOMINAL AND TRANSVAGINAL ULTRASOUND OF PELVIS  TECHNIQUE: Both transabdominal and transvaginal ultrasound examinations of the pelvis were performed. Transabdominal technique was performed for global imaging of the pelvis including uterus, ovaries, adnexal regions, and pelvic cul-de-sac. It was necessary to proceed with endovaginal exam following the transabdominal exam to visualize the endometrium and ovaries.  COMPARISON:  None  FINDINGS: Uterus  Measurements: 11.0 x 5.1 x 6.6 cm = volume: 193 mL. No fibroids or other mass visualized.  Endometrium  Thickness: 19 mm.  No focal abnormality visualized.  Right ovary  Measurements: 2.5 x 2.2 x 2 1 cm = volume: 6.0 mL. Normal appearance/no adnexal mass.  Left ovary  Measurements: 4.1 x 2.2 x 2.6 cm = volume: 12.1 mL. Normal appearance/no adnexal mass.  Other findings  No abnormal free fluid.  IMPRESSION: No fibroids or other pelvic mass identified.  Endometrial thickness measures 19 mm. If bleeding remains unresponsive to hormonal or medical therapy, focal lesion work-up with sonohysterogram should be considered. Endometrial biopsy should also be considered in pre-menopausal patients at high risk for endometrial carcinoma. (Ref: Radiological Reasoning: Algorithmic Workup of Abnormal Vaginal Bleeding with Endovaginal Sonography and Sonohysterography. AJR 2008; 213:Y86-57)   Electronically Signed   By: Ellen Booker M.D.   On: 11/04/2018 16:03   Assessment [place] was normal for cycle day 20 with 28 day cycle, luteal phase Infertility, she would consider further evaluation and referral  obesity  Body mass index is 44.46 kg/m.  Plan Referral to Dr Kerin Perna Weight loss advised Routine GYN f/u as indicated Endometrial biopsy not indicated today    Ellen Booker 11/24/2018, 9:59 AM

## 2018-12-17 ENCOUNTER — Telehealth: Payer: Self-pay | Admitting: Physician Assistant

## 2018-12-17 ENCOUNTER — Ambulatory Visit: Payer: Medicaid Other

## 2018-12-17 DIAGNOSIS — N898 Other specified noninflammatory disorders of vagina: Secondary | ICD-10-CM

## 2018-12-17 MED ORDER — METRONIDAZOLE 500 MG PO TABS: 500.0000 mg | ORAL_TABLET | Freq: Two times a day (BID) | ORAL | 0 refills | Status: AC

## 2018-12-17 NOTE — Progress Notes (Signed)
We are sorry that you are not feeling well. Here is how we plan to help! Based on what you shared with me it looks like you: May have a vaginosis due to bacteria  Vaginosis is an inflammation of the vagina that can result in discharge, itching and pain. The cause is usually a change in the normal balance of vaginal bacteria or an infection. Vaginosis can also result from reduced estrogen levels after menopause.  The most common causes of vaginosis are:   Bacterial vaginosis which results from an overgrowth of one on several organisms that are normally present in your vagina.   Yeast infections which are caused by a naturally occurring fungus called candida.   Vaginal atrophy (atrophic vaginosis) which results from the thinning of the vagina from reduced estrogen levels after menopause.   Trichomoniasis which is caused by a parasite and is commonly transmitted by sexual intercourse.  Factors that increase your risk of developing vaginosis include: Marland Kitchen Medications, such as antibiotics and steroids . Uncontrolled diabetes . Use of hygiene products such as bubble bath, vaginal spray or vaginal deodorant . Douching . Wearing damp or tight-fitting clothing . Using an intrauterine device (IUD) for birth control . Hormonal changes, such as those associated with pregnancy, birth control pills or menopause . Sexual activity . Having a sexually transmitted infection  Your treatment plan is Metronidazole or Flagyl 500mg  twice a day for 7 days.  I have electronically sent this prescription into the pharmacy that you have chosen. Do not drink alcohol while taking this medication.   Be sure to take all of the medication as directed. Stop taking any medication if you develop a rash, tongue swelling or shortness of breath. Mothers who are breast feeding should consider pumping and discarding their breast milk while on these antibiotics. However, there is no consensus that infant exposure at these doses  would be harmful.  Remember that medication creams can weaken latex condoms.   HOME CARE:  Good hygiene may prevent some types of vaginosis from recurring and may relieve some symptoms:  . Avoid baths, hot tubs and whirlpool spas. Rinse soap from your outer genital area after a shower, and dry the area well to prevent irritation. Don't use scented or harsh soaps, such as those with deodorant or antibacterial action. Marland Kitchen Avoid irritants. These include scented tampons and pads. . Wipe from front to back after using the toilet. Doing so avoids spreading fecal bacteria to your vagina.  Other things that may help prevent vaginosis include:  Marland Kitchen Don't douche. Your vagina doesn't require cleansing other than normal bathing. Repetitive douching disrupts the normal organisms that reside in the vagina and can actually increase your risk of vaginal infection. Douching won't clear up a vaginal infection. . Use a latex condom. Both female and female latex condoms may help you avoid infections spread by sexual contact. . Wear cotton underwear. Also wear pantyhose with a cotton crotch. If you feel comfortable without it, skip wearing underwear to bed. Yeast thrives in Marland Kitchen Your symptoms should improve in the next day or two.  GET HELP RIGHT AWAY IF:  . You have pain in your lower abdomen ( pelvic area or over your ovaries) . You develop nausea or vomiting . You develop a fever . Your discharge changes or worsens . You have persistent pain with intercourse . You develop shortness of breath, a rapid pulse, or you faint.  These symptoms could be signs of problems or infections that need  to be evaluated by a medical provider now.  MAKE SURE YOU    Understand these instructions.  Will watch your condition.  Will get help right away if you are not doing well or get worse.  Your e-visit answers were reviewed by a board certified advanced clinical practitioner to complete your personal  care plan. Depending upon the condition, your plan could have included both over the counter or prescription medications. Please review your pharmacy choice to make sure that you have choses a pharmacy that is open for you to pick up any needed prescription, Your safety is important to [place]. If you have drug allergies check your prescription carefully.   You can use MyChart to ask questions about today's visit, request a non-urgent call back, or ask for a work or school excuse for 24 hours related to this e-Visit. If it has been greater than 24 hours you will need to follow up with your provider, or enter a new e-Visit to address those concerns. You will get a MyChart message within the next two days asking about your experience. I hope that your e-visit has been valuable and will speed your recovery.  Greater than 5 minutes, yet less than 10 minutes of time have been spend researching, coordinating, and implementing care for this patient today.

## 2018-12-28 ENCOUNTER — Ambulatory Visit: Payer: Medicaid Other

## 2019-01-18 ENCOUNTER — Telehealth (INDEPENDENT_AMBULATORY_CARE_PROVIDER_SITE_OTHER): Payer: Medicaid Other | Admitting: Family

## 2019-01-18 DIAGNOSIS — N760 Acute vaginitis: Secondary | ICD-10-CM

## 2019-01-18 DIAGNOSIS — N898 Other specified noninflammatory disorders of vagina: Secondary | ICD-10-CM | POA: Diagnosis not present

## 2019-01-18 DIAGNOSIS — B9689 Other specified bacterial agents as the cause of diseases classified elsewhere: Secondary | ICD-10-CM

## 2019-01-18 MED ORDER — METRONIDAZOLE 500 MG PO TABS: 500.0000 mg | ORAL_TABLET | Freq: Two times a day (BID) | ORAL | 0 refills | Status: DC

## 2019-01-18 NOTE — Progress Notes (Signed)
We are sorry that you are not feeling well. Here is how we plan to help! Based on what you shared with me it looks like you: May have a vaginosis due to bacteria  Vaginosis is an inflammation of the vagina that can result in discharge, itching and pain. The cause is usually a change in the normal balance of vaginal bacteria or an infection. Vaginosis can also result from reduced estrogen levels after menopause.  The most common causes of vaginosis are:   Bacterial vaginosis which results from an overgrowth of one on several organisms that are normally present in your vagina.   Yeast infections which are caused by a naturally occurring fungus called candida.   Vaginal atrophy (atrophic vaginosis) which results from the thinning of the vagina from reduced estrogen levels after menopause.   Trichomoniasis which is caused by a parasite and is commonly transmitted by sexual intercourse.  Factors that increase your risk of developing vaginosis include: Marland Kitchen Medications, such as antibiotics and steroids . Uncontrolled diabetes . Use of hygiene products such as bubble bath, vaginal spray or vaginal deodorant . Douching . Wearing damp or tight-fitting clothing . Using an intrauterine device (IUD) for birth control . Hormonal changes, such as those associated with pregnancy, birth control pills or menopause . Sexual activity . Having a sexually transmitted infection  Your treatment plan is Metronidazole or Flagyl 500mg  twice a day for 7 days.  I have electronically sent this prescription into the pharmacy that you have chosen. If your symptoms return, you will need to be seen face to face for tests.   Be sure to take all of the medication as directed. Stop taking any medication if you develop a rash, tongue swelling or shortness of breath. Mothers who are breast feeding should consider pumping and discarding their breast milk while on these antibiotics. However, there is no consensus that infant  exposure at these doses would be harmful.  Remember that medication creams can weaken latex condoms. Marland Kitchen   HOME CARE:  Good hygiene may prevent some types of vaginosis from recurring and may relieve some symptoms:  . Avoid baths, hot tubs and whirlpool spas. Rinse soap from your outer genital area after a shower, and dry the area well to prevent irritation. Don't use scented or harsh soaps, such as those with deodorant or antibacterial action. Marland Kitchen Avoid irritants. These include scented tampons and pads. . Wipe from front to back after using the toilet. Doing so avoids spreading fecal bacteria to your vagina.  Other things that may help prevent vaginosis include:  Marland Kitchen Don't douche. Your vagina doesn't require cleansing other than normal bathing. Repetitive douching disrupts the normal organisms that reside in the vagina and can actually increase your risk of vaginal infection. Douching won't clear up a vaginal infection. . Use a latex condom. Both female and female latex condoms may help you avoid infections spread by sexual contact. . Wear cotton underwear. Also wear pantyhose with a cotton crotch. If you feel comfortable without it, skip wearing underwear to bed. Yeast thrives in [employer] Your symptoms should improve in the next day or two.  GET HELP RIGHT AWAY IF:  . You have pain in your lower abdomen ( pelvic area or over your ovaries) . You develop nausea or vomiting . You develop a fever . Your discharge changes or worsens . You have persistent pain with intercourse . You develop shortness of breath, a rapid pulse, or you faint.  These symptoms could be  signs of problems or infections that need to be evaluated by a medical provider now.  MAKE SURE YOU    Understand these instructions.  Will watch your condition.  Will get help right away if you are not doing well or get worse.  Your e-visit answers were reviewed by a board certified advanced clinical practitioner to  complete your personal care plan. Depending upon the condition, your plan could have included both over the counter or prescription medications. Please review your pharmacy choice to make sure that you have choses a pharmacy that is open for you to pick up any needed prescription, Your safety is important to [place]. If you have drug allergies check your prescription carefully.   You can use MyChart to ask questions about today's visit, request a non-urgent call back, or ask for a work or school excuse for 24 hours related to this e-Visit. If it has been greater than 24 hours you will need to follow up with your provider, or enter a new e-Visit to address those concerns. You will get a MyChart message within the next two days asking about your experience. I hope that your e-visit has been valuable and will speed your recovery.  Approximately 5 minutes was spent documenting and reviewing patient's chart.

## 2019-01-23 ENCOUNTER — Other Ambulatory Visit: Payer: Self-pay | Admitting: Obstetrics

## 2019-01-23 DIAGNOSIS — F17210 Nicotine dependence, cigarettes, uncomplicated: Secondary | ICD-10-CM

## 2019-01-26 ENCOUNTER — Other Ambulatory Visit: Payer: Self-pay | Admitting: Obstetrics

## 2019-01-26 DIAGNOSIS — F17210 Nicotine dependence, cigarettes, uncomplicated: Secondary | ICD-10-CM

## 2019-01-26 NOTE — Telephone Encounter (Signed)
Interface, Surescripts Out  [facility] Rx Refill Pool  Caller: Unspecified (Today, 2:58 PM)        Request received via interface.   (very important)  New medications from outside sources are available for reconciliation.  Requested Prescriptions   Name from pharmacy: Chantix Continuing Month Pak 1 MG Oral Tablet       Will file in chart as: Plato PAK 1 MG tablet    Possible duplicate: Hover to review recent actions on this medication   Sig: Take 1 tablet by mouth twice daily   Disp:  56 tablet  Refills:  0   Start: 01/26/2019   Class: Normal   Non-formulary For: Tobacco dependence due to cigarettes   Last ordered: 3 months ago by Shelly Bombard, MD Last refill: 12/22/2018   Rx #: [id number]     To be filled at: [facility]  [place]      Medication Refill  Interface, Surescripts Out  [facility] Rx Refill Pool 1 hour ago (2:58 PM)

## 2019-02-01 ENCOUNTER — Other Ambulatory Visit: Payer: Self-pay | Admitting: Obstetrics

## 2019-02-01 ENCOUNTER — Telehealth: Payer: Self-pay

## 2019-02-01 DIAGNOSIS — F17210 Nicotine dependence, cigarettes, uncomplicated: Secondary | ICD-10-CM

## 2019-02-01 MED ORDER — VARENICLINE TARTRATE 1 MG PO TABS: 1.0000 mg | ORAL_TABLET | Freq: Two times a day (BID) | ORAL | 1 refills | Status: DC

## 2019-02-01 MED ORDER — CHANTIX STARTING MONTH PAK 0.5 MG X 11 & 1 MG X 42 PO TABS: ORAL_TABLET | ORAL | 0 refills | Status: DC

## 2019-02-01 NOTE — Telephone Encounter (Signed)
TC from pt requesting refill on Chantix Rx To [facility] on [address]

## 2019-02-01 NOTE — Telephone Encounter (Signed)
Chantix Rx.  She may have to come by office for printed copy.

## 2019-02-02 ENCOUNTER — Telehealth: Payer: Medicaid Other | Admitting: Physician Assistant

## 2019-02-02 DIAGNOSIS — B373 Candidiasis of vulva and vagina: Secondary | ICD-10-CM

## 2019-02-02 DIAGNOSIS — B3731 Acute candidiasis of vulva and vagina: Secondary | ICD-10-CM

## 2019-02-02 MED ORDER — FLUCONAZOLE 150 MG PO TABS: 150.0000 mg | ORAL_TABLET | Freq: Once | ORAL | 0 refills | Status: AC

## 2019-02-02 NOTE — Progress Notes (Signed)
We are sorry that you are not feeling well. Here is how we plan to help! Based on what you shared with me it looks like you: May have a yeast vaginosis  Vaginosis is an inflammation of the vagina that can result in discharge, itching and pain. The cause is usually a change in the normal balance of vaginal bacteria or an infection. Vaginosis can also result from reduced estrogen levels after menopause.  The most common causes of vaginosis are:   Bacterial vaginosis which results from an overgrowth of one on several organisms that are normally present in your vagina.   Yeast infections which are caused by a naturally occurring fungus called candida.   Vaginal atrophy (atrophic vaginosis) which results from the thinning of the vagina from reduced estrogen levels after menopause.   Trichomoniasis which is caused by a parasite and is commonly transmitted by sexual intercourse.  Factors that increase your risk of developing vaginosis include: . Medications, such as antibiotics and steroids . Uncontrolled diabetes . Use of hygiene products such as bubble bath, vaginal spray or vaginal deodorant . Douching . Wearing damp or tight-fitting clothing . Using an intrauterine device (IUD) for birth control . Hormonal changes, such as those associated with pregnancy, birth control pills or menopause . Sexual activity . Having a sexually transmitted infection  Your treatment plan is A single Diflucan (fluconazole) 150mg tablet once.  I have electronically sent this prescription into the pharmacy that you have chosen.  Be sure to take all of the medication as directed. Stop taking any medication if you develop a rash, tongue swelling or shortness of breath. Mothers who are breast feeding should consider pumping and discarding their breast milk while on these antibiotics. However, there is no consensus that infant exposure at these doses would be harmful.  Remember that medication creams can weaken latex  condoms. .   HOME CARE:  Good hygiene may prevent some types of vaginosis from recurring and may relieve some symptoms:  . Avoid baths, hot tubs and whirlpool spas. Rinse soap from your outer genital area after a shower, and dry the area well to prevent irritation. Don't use scented or harsh soaps, such as those with deodorant or antibacterial action. . Avoid irritants. These include scented tampons and pads. . Wipe from front to back after using the toilet. Doing so avoids spreading fecal bacteria to your vagina.  Other things that may help prevent vaginosis include:  . Don't douche. Your vagina doesn't require cleansing other than normal bathing. Repetitive douching disrupts the normal organisms that reside in the vagina and can actually increase your risk of vaginal infection. Douching won't clear up a vaginal infection. . Use a latex condom. Both female and female latex condoms may help you avoid infections spread by sexual contact. . Wear cotton underwear. Also wear pantyhose with a cotton crotch. If you feel comfortable without it, skip wearing underwear to bed. Yeast thrives in moist environments Your symptoms should improve in the next day or two.  GET HELP RIGHT AWAY IF:  . You have pain in your lower abdomen ( pelvic area or over your ovaries) . You develop nausea or vomiting . You develop a fever . Your discharge changes or worsens . You have persistent pain with intercourse . You develop shortness of breath, a rapid pulse, or you faint.  These symptoms could be signs of problems or infections that need to be evaluated by a medical provider now.  MAKE SURE YOU      Understand these instructions.  Will watch your condition.  Will get help right away if you are not doing well or get worse.  Your e-visit answers were reviewed by a board certified advanced clinical practitioner to complete your personal care plan. Depending upon the condition, your plan could have included  both over the counter or prescription medications. Please review your pharmacy choice to make sure that you have choses a pharmacy that is open for you to pick up any needed prescription, Your safety is important to us. If you have drug allergies check your prescription carefully.   You can use MyChart to ask questions about today's visit, request a non-urgent call back, or ask for a work or school excuse for 24 hours related to this e-Visit. If it has been greater than 24 hours you will need to follow up with your provider, or enter a new e-Visit to address those concerns. You will get a MyChart message within the next two days asking about your experience. I hope that your e-visit has been valuable and will speed your recovery.  Greater than 5 minutes, yet less than 10 minutes of time have been spent researching, coordinating and implementing care for this patient today.   

## 2019-03-26 ENCOUNTER — Telehealth: Payer: Medicaid Other | Admitting: Emergency Medicine

## 2019-03-26 DIAGNOSIS — R21 Rash and other nonspecific skin eruption: Secondary | ICD-10-CM | POA: Diagnosis not present

## 2019-03-26 MED ORDER — PREDNISONE 20 MG PO TABS: 40.0000 mg | ORAL_TABLET | Freq: Every day | ORAL | 0 refills | Status: DC

## 2019-03-26 NOTE — Progress Notes (Signed)
E Visit for Rash  We are sorry that you are not feeling well. Here is how we plan to help!  Thank you for sharing the pictures.  Pictures are very helpful when diagnosing rashes.  It does not look like your rash is a dangerous rash.  Because of the diffuse nature of your rash, I recommend oral steroids.  I will send prednisone to your pharmacy.  You will take it for 5 days.  It should knock down the rash.  You can also take Benadryl as listed below.  Rashes can be caused by viruses.  I suspect that this is the cause of your rash.  Especially in the setting of having a sore throat as well.  Monitor your symptoms carefully.  If you develop fever, or have worsening symptoms, please make an appointment for an in person visit.  HOME CARE:   Take cool showers and avoid direct sunlight.  Apply cool compress or wet dressings.  Take a bath in an oatmeal bath.  Sprinkle content of one Aveeno packet under running faucet with comfortably warm water.  Bathe for 15-20 minutes, 1-2 times daily.  Pat dry with a towel. Do not rub the rash.  Use hydrocortisone cream.  Take an antihistamine like Benadryl for widespread rashes that itch.  The adult dose of Benadryl is 25-50 mg by mouth 4 times daily.  Caution:  This type of medication may cause sleepiness.  Do not drink alcohol, drive, or operate dangerous machinery while taking antihistamines.  Do not take these medications if you have prostate enlargement.  Read package instructions thoroughly on all medications that you take.  GET HELP RIGHT AWAY IF:   Symptoms don't go away after treatment.  Severe itching that persists.  If you rash spreads or swells.  If you rash begins to smell.  If it blisters and opens or develops a yellow-brown crust.  You develop a fever.  You have a sore throat.  You become short of breath.  MAKE SURE YOU:  Understand these instructions. Will watch your condition. Will get help right away if you are not doing  well or get worse.  Thank you for choosing an e-visit. Your e-visit answers were reviewed by a board certified advanced clinical practitioner to complete your personal care plan. Depending upon the condition, your plan could have included both over the counter or prescription medications. Please review your pharmacy choice. Be sure that the pharmacy you have chosen is open so that you can pick up your prescription now.  If there is a problem you may message your provider in MyChart to have the prescription routed to another pharmacy. Your safety is important to [place]. If you have drug allergies check your prescription carefully.  For the next 24 hours, you can use MyChart to ask questions about today's visit, request a non-urgent call back, or ask for a work or school excuse from your e-visit provider. You will get an email in the next two days asking about your experience. I hope that your e-visit has been valuable and will speed your recovery.    Greater than 5 minutes, yet less than 10 minutes was used in reviewing the patient's chart, questionnaire, prescribing medications, and documentation for this visit.

## 2019-03-28 ENCOUNTER — Inpatient Hospital Stay
Admission: RE | Admit: 2019-03-28 | Discharge: 2019-03-28 | Disposition: A | Discharge status: Home / Self Care | Payer: Medicaid Other | Source: Ambulatory Visit

## 2019-04-29 ENCOUNTER — Ambulatory Visit: Payer: Medicaid Other | Admitting: Obstetrics

## 2019-06-19 ENCOUNTER — Telehealth: Payer: Medicaid Other | Admitting: Emergency Medicine

## 2019-06-19 DIAGNOSIS — N898 Other specified noninflammatory disorders of vagina: Secondary | ICD-10-CM | POA: Diagnosis not present

## 2019-06-19 MED ORDER — FLUCONAZOLE 150 MG PO TABS: 150.0000 mg | ORAL_TABLET | Freq: Once | ORAL | 0 refills | Status: AC

## 2019-06-19 NOTE — Progress Notes (Signed)
We are sorry that you are not feeling well. Here is how we plan to help! Based on what you shared with me it looks like you: May have a yeast vaginosis  Vaginosis is an inflammation of the vagina that can result in discharge, itching and pain. The cause is usually a change in the normal balance of vaginal bacteria or an infection. Vaginosis can also result from reduced estrogen levels after menopause.  The most common causes of vaginosis are:   Bacterial vaginosis which results from an overgrowth of one on several organisms that are normally present in your vagina.   Yeast infections which are caused by a naturally occurring fungus called candida.   Vaginal atrophy (atrophic vaginosis) which results from the thinning of the vagina from reduced estrogen levels after menopause.   Trichomoniasis which is caused by a parasite and is commonly transmitted by sexual intercourse.  Factors that increase your risk of developing vaginosis include: . Medications, such as antibiotics and steroids . Uncontrolled diabetes . Use of hygiene products such as bubble bath, vaginal spray or vaginal deodorant . Douching . Wearing damp or tight-fitting clothing . Using an intrauterine device (IUD) for birth control . Hormonal changes, such as those associated with pregnancy, birth control pills or menopause . Sexual activity . Having a sexually transmitted infection  Your treatment plan is A single Diflucan (fluconazole) 150mg tablet once.  I have electronically sent this prescription into the pharmacy that you have chosen.  Be sure to take all of the medication as directed. Stop taking any medication if you develop a rash, tongue swelling or shortness of breath. Mothers who are breast feeding should consider pumping and discarding their breast milk while on these antibiotics. However, there is no consensus that infant exposure at these doses would be harmful.  Remember that medication creams can weaken latex  condoms. .   HOME CARE:  Good hygiene may prevent some types of vaginosis from recurring and may relieve some symptoms:  . Avoid baths, hot tubs and whirlpool spas. Rinse soap from your outer genital area after a shower, and dry the area well to prevent irritation. Don't use scented or harsh soaps, such as those with deodorant or antibacterial action. . Avoid irritants. These include scented tampons and pads. . Wipe from front to back after using the toilet. Doing so avoids spreading fecal bacteria to your vagina.  Other things that may help prevent vaginosis include:  . Don't douche. Your vagina doesn't require cleansing other than normal bathing. Repetitive douching disrupts the normal organisms that reside in the vagina and can actually increase your risk of vaginal infection. Douching won't clear up a vaginal infection. . Use a latex condom. Both female and female latex condoms may help you avoid infections spread by sexual contact. . Wear cotton underwear. Also wear pantyhose with a cotton crotch. If you feel comfortable without it, skip wearing underwear to bed. Yeast thrives in moist environments Your symptoms should improve in the next day or two.  GET HELP RIGHT AWAY IF:  . You have pain in your lower abdomen ( pelvic area or over your ovaries) . You develop nausea or vomiting . You develop a fever . Your discharge changes or worsens . You have persistent pain with intercourse . You develop shortness of breath, a rapid pulse, or you faint.  These symptoms could be signs of problems or infections that need to be evaluated by a medical provider now.  MAKE SURE YOU      Understand these instructions.  Will watch your condition.  Will get help right away if you are not doing well or get worse.  Your e-visit answers were reviewed by a board certified advanced clinical practitioner to complete your personal care plan. Depending upon the condition, your plan could have included  both over the counter or prescription medications. Please review your pharmacy choice to make sure that you have choses a pharmacy that is open for you to pick up any needed prescription, Your safety is important to us. If you have drug allergies check your prescription carefully.   You can use MyChart to ask questions about today's visit, request a non-urgent call back, or ask for a work or school excuse for 24 hours related to this e-Visit. If it has been greater than 24 hours you will need to follow up with your provider, or enter a new e-Visit to address those concerns. You will get a MyChart message within the next two days asking about your experience. I hope that your e-visit has been valuable and will speed your recovery.  Approximately 5 minutes was used in reviewing the patient's chart, questionnaire, prescribing medications, and documentation.  

## 2019-07-01 ENCOUNTER — Telehealth: Payer: Medicaid Other | Admitting: Physician Assistant

## 2019-07-01 DIAGNOSIS — N898 Other specified noninflammatory disorders of vagina: Secondary | ICD-10-CM | POA: Diagnosis not present

## 2019-07-01 MED ORDER — METRONIDAZOLE 500 MG PO TABS: 500.0000 mg | ORAL_TABLET | Freq: Two times a day (BID) | ORAL | 0 refills | Status: AC

## 2019-07-01 NOTE — Progress Notes (Signed)

## 2019-08-02 ENCOUNTER — Telehealth: Payer: Medicaid Other | Admitting: Emergency Medicine

## 2019-08-02 DIAGNOSIS — N898 Other specified noninflammatory disorders of vagina: Secondary | ICD-10-CM | POA: Diagnosis not present

## 2019-08-02 MED ORDER — FLUCONAZOLE 150 MG PO TABS: ORAL_TABLET | ORAL | 0 refills | Status: DC

## 2019-08-02 MED ORDER — METRONIDAZOLE 500 MG PO TABS: 500.0000 mg | ORAL_TABLET | Freq: Two times a day (BID) | ORAL | 0 refills | Status: DC

## 2019-08-02 NOTE — Progress Notes (Signed)
We are sorry that you are not feeling well. Here is how we plan to help! Based on what you shared with me it looks like you: May have a vaginosis due to bacteria  Vaginosis is an inflammation of the vagina that can result in discharge, itching and pain. The cause is usually a change in the normal balance of vaginal bacteria or an infection. Vaginosis can also result from reduced estrogen levels after menopause.  The most common causes of vaginosis are:   Bacterial vaginosis which results from an overgrowth of one on several organisms that are normally present in your vagina.   Yeast infections which are caused by a naturally occurring fungus called candida.   Vaginal atrophy (atrophic vaginosis) which results from the thinning of the vagina from reduced estrogen levels after menopause.   Trichomoniasis which is caused by a parasite and is commonly transmitted by sexual intercourse.  Factors that increase your risk of developing vaginosis include: Marland Kitchen Medications, such as antibiotics and steroids . Uncontrolled diabetes . Use of hygiene products such as bubble bath, vaginal spray or vaginal deodorant . Douching . Wearing damp or tight-fitting clothing . Using an intrauterine device (IUD) for birth control . Hormonal changes, such as those associated with pregnancy, birth control pills or menopause . Sexual activity . Having a sexually transmitted infection  Your treatment plan is Metronidazole or Flagyl 500mg  twice a day for 7 days.  I have electronically sent this prescription into the pharmacy that you have chosen.   I've also sent 1 tablet of Diflucan that will cover you for yeast infection. Take this after finishing the antibiotic.  Be sure to take all of the medication as directed. Stop taking any medication if you develop a rash, tongue swelling or shortness of breath. Mothers who are breast feeding should consider pumping and discarding their breast milk while on these antibiotics.  However, there is no consensus that infant exposure at these doses would be harmful.  Remember that medication creams can weaken latex condoms.   HOME CARE:  Good hygiene may prevent some types of vaginosis from recurring and may relieve some symptoms:  . Avoid baths, hot tubs and whirlpool spas. Rinse soap from your outer genital area after a shower, and dry the area well to prevent irritation. Don't use scented or harsh soaps, such as those with deodorant or antibacterial action. Marland Kitchen Avoid irritants. These include scented tampons and pads. . Wipe from front to back after using the toilet. Doing so avoids spreading fecal bacteria to your vagina.  Other things that may help prevent vaginosis include:  Marland Kitchen Don't douche. Your vagina doesn't require cleansing other than normal bathing. Repetitive douching disrupts the normal organisms that reside in the vagina and can actually increase your risk of vaginal infection. Douching won't clear up a vaginal infection. . Use a latex condom. Both female and female latex condoms may help you avoid infections spread by sexual contact. . Wear cotton underwear. Also wear pantyhose with a cotton crotch. If you feel comfortable without it, skip wearing underwear to bed. Yeast thrives in [place] Your symptoms should improve in the next day or two.  GET HELP RIGHT AWAY IF:  . You have pain in your lower abdomen ( pelvic area or over your ovaries) . You develop nausea or vomiting . You develop a fever . Your discharge changes or worsens . You have persistent pain with intercourse . You develop shortness of breath, a rapid pulse, or you  faint.  These symptoms could be signs of problems or infections that need to be evaluated by a medical provider now.  MAKE SURE YOU    Understand these instructions.  Will watch your condition.  Will get help right away if you are not doing well or get worse.  Your e-visit answers were reviewed by a board  certified advanced clinical practitioner to complete your personal care plan. Depending upon the condition, your plan could have included both over the counter or prescription medications. Please review your pharmacy choice to make sure that you have choses a pharmacy that is open for you to pick up any needed prescription, Your safety is important to [place]. If you have drug allergies check your prescription carefully.   You can use MyChart to ask questions about today's visit, request a non-urgent call back, or ask for a work or school excuse for 24 hours related to this e-Visit. If it has been greater than 24 hours you will need to follow up with your provider, or enter a new e-Visit to address those concerns. You will get a MyChart message within the next two days asking about your experience. I hope that your e-visit has been valuable and will speed your recovery.  Approximately 5 minutes was used in reviewing the patient's chart, questionnaire, prescribing medications, and documentation.

## 2019-10-07 ENCOUNTER — Other Ambulatory Visit: Payer: Self-pay | Admitting: Obstetrics

## 2019-10-30 ENCOUNTER — Telehealth: Payer: Medicaid Other | Admitting: Nurse Practitioner

## 2019-10-30 DIAGNOSIS — N760 Acute vaginitis: Secondary | ICD-10-CM

## 2019-10-30 DIAGNOSIS — B9689 Other specified bacterial agents as the cause of diseases classified elsewhere: Secondary | ICD-10-CM

## 2019-10-30 MED ORDER — CLINDAMYCIN PHOSPHATE 2 % VA CREA: 1.0000 | TOPICAL_CREAM | Freq: Every day | VAGINAL | 0 refills | Status: DC

## 2019-10-30 MED ORDER — METRONIDAZOLE 500 MG PO TABS: 500.0000 mg | ORAL_TABLET | Freq: Two times a day (BID) | ORAL | 0 refills | Status: DC

## 2019-10-30 NOTE — Progress Notes (Signed)
We are sorry that you are not feeling well. Here is how we plan to help! Based on what you shared with me it looks like you: May have a vaginosis due to bacteria  Vaginosis is an inflammation of the vagina that can result in discharge, itching and pain. The cause is usually a change in the normal balance of vaginal bacteria or an infection. Vaginosis can also result from reduced estrogen levels after menopause.  The most common causes of vaginosis are:   Bacterial vaginosis which results from an overgrowth of one on several organisms that are normally present in your vagina.   Yeast infections which are caused by a naturally occurring fungus called candida.   Vaginal atrophy (atrophic vaginosis) which results from the thinning of the vagina from reduced estrogen levels after menopause.   Trichomoniasis which is caused by a parasite and is commonly transmitted by sexual intercourse.  Factors that increase your risk of developing vaginosis include: Marland Kitchen Medications, such as antibiotics and steroids . Uncontrolled diabetes . Use of hygiene products such as bubble bath, vaginal spray or vaginal deodorant . Douching . Wearing damp or tight-fitting clothing . Using an intrauterine device (IUD) for birth control . Hormonal changes, such as those associated with pregnancy, birth control pills or menopause . Sexual activity . Having a sexually transmitted infection  Your treatment plan is flagyl 528m 1 po 2x a day for 7  Days and  Clindamycin vaginal cream 5 grams applied vaginally for 7 days.  I have electronically sent this prescription into the pharmacy that you have chosen.  Be sure to take all of the medication as directed. Stop taking any medication if you develop a rash, tongue swelling or shortness of breath. Mothers who are breast feeding should consider pumping and discarding their breast milk while on these antibiotics. However, there is no consensus that infant exposure at these doses  would be harmful.  Remember that medication creams can weaken latex condoms. Marland Kitchen   HOME CARE:  Good hygiene may prevent some types of vaginosis from recurring and may relieve some symptoms:  . Avoid baths, hot tubs and whirlpool spas. Rinse soap from your outer genital area after a shower, and dry the area well to prevent irritation. Don't use scented or harsh soaps, such as those with deodorant or antibacterial action. Marland Kitchen Avoid irritants. These include scented tampons and pads. . Wipe from front to back after using the toilet. Doing so avoids spreading fecal bacteria to your vagina.  Other things that may help prevent vaginosis include:  Marland Kitchen Don't douche. Your vagina doesn't require cleansing other than normal bathing. Repetitive douching disrupts the normal organisms that reside in the vagina and can actually increase your risk of vaginal infection. Douching won't clear up a vaginal infection. . Use a latex condom. Both female and female latex condoms may help you avoid infections spread by sexual contact. . Wear cotton underwear. Also wear pantyhose with a cotton crotch. If you feel comfortable without it, skip wearing underwear to bed. Yeast thrives in [employer] Your symptoms should improve in the next day or two.  GET HELP RIGHT AWAY IF:  . You have pain in your lower abdomen ( pelvic area or over your ovaries) . You develop nausea or vomiting . You develop a fever . Your discharge changes or worsens . You have persistent pain with intercourse . You develop shortness of breath, a rapid pulse, or you faint.  These symptoms could be signs of problems  or infections that need to be evaluated by a medical provider now.  MAKE SURE YOU    Understand these instructions.  Will watch your condition.  Will get help right away if you are not doing well or get worse.  Your e-visit answers were reviewed by a board certified advanced clinical practitioner to complete your personal  care plan. Depending upon the condition, your plan could have included both over the counter or prescription medications. Please review your pharmacy choice to make sure that you have choses a pharmacy that is open for you to pick up any needed prescription, Your safety is important to [place]. If you have drug allergies check your prescription carefully.   You can use MyChart to ask questions about today's visit, request a non-urgent call back, or ask for a work or school excuse for 24 hours related to this e-Visit. If it has been greater than 24 hours you will need to follow up with your provider, or enter a new e-Visit to address those concerns. You will get a MyChart message within the next two days asking about your experience. I hope that your e-visit has been valuable and will speed your recovery.  5-10 minutes spent reviewing and documenting in chart.

## 2019-11-01 ENCOUNTER — Telehealth: Payer: Self-pay | Admitting: *Deleted

## 2019-11-01 NOTE — Telephone Encounter (Signed)
Not a pt of [facility]

## 2020-01-13 ENCOUNTER — Telehealth: Payer: Medicaid Other | Admitting: Emergency Medicine

## 2020-01-13 DIAGNOSIS — B373 Candidiasis of vulva and vagina: Secondary | ICD-10-CM

## 2020-01-13 DIAGNOSIS — B3731 Acute candidiasis of vulva and vagina: Secondary | ICD-10-CM

## 2020-01-13 MED ORDER — MONISTAT 1 COMBO PACK 1200 & 2 MG & % VA KIT: 1.0000 | PACK | Freq: Once | VAGINAL | 0 refills | Status: AC

## 2020-01-13 MED ORDER — FLUCONAZOLE 150 MG PO TABS: 150.0000 mg | ORAL_TABLET | Freq: Once | ORAL | 0 refills | Status: AC

## 2020-01-13 NOTE — Progress Notes (Signed)
We are sorry that you are not feeling well. Here is how we plan to help! Based on what you shared with me it looks like you: May have a yeast vaginosis  Vaginosis is an inflammation of the vagina that can result in discharge, itching and pain. The cause is usually a change in the normal balance of vaginal bacteria or an infection. Vaginosis can also result from reduced estrogen levels after menopause.  The most common causes of vaginosis are:   Bacterial vaginosis which results from an overgrowth of one on several organisms that are normally present in your vagina.   Yeast infections which are caused by a naturally occurring fungus called candida.   Vaginal atrophy (atrophic vaginosis) which results from the thinning of the vagina from reduced estrogen levels after menopause.   Trichomoniasis which is caused by a parasite and is commonly transmitted by sexual intercourse.  Factors that increase your risk of developing vaginosis include: Marland Kitchen Medications, such as antibiotics and steroids . Uncontrolled diabetes . Use of hygiene products such as bubble bath, vaginal spray or vaginal deodorant . Douching . Wearing damp or tight-fitting clothing . Using an intrauterine device (IUD) for birth control . Hormonal changes, such as those associated with pregnancy, birth control pills or menopause . Sexual activity . Having a sexually transmitted infection  Your treatment plan is A single Diflucan (fluconazole) 150mg  tablet once and topical creaam.  I have electronically sent this prescription into the pharmacy that you have chosen.  Be sure to take all of the medication as directed. Stop taking any medication if you develop a rash, tongue swelling or shortness of breath. Mothers who are breast feeding should consider pumping and discarding their breast milk while on these antibiotics. However, there is no consensus that infant exposure at these doses would be harmful.  Remember that medication  creams can weaken latex condoms.   HOME CARE:  Good hygiene may prevent some types of vaginosis from recurring and may relieve some symptoms:  . Avoid baths, hot tubs and whirlpool spas. Rinse soap from your outer genital area after a shower, and dry the area well to prevent irritation. Don't use scented or harsh soaps, such as those with deodorant or antibacterial action. Marland Kitchen Avoid irritants. These include scented tampons and pads. . Wipe from front to back after using the toilet. Doing so avoids spreading fecal bacteria to your vagina.  Other things that may help prevent vaginosis include:  Marland Kitchen Don't douche. Your vagina doesn't require cleansing other than normal bathing. Repetitive douching disrupts the normal organisms that reside in the vagina and can actually increase your risk of vaginal infection. Douching won't clear up a vaginal infection. . Use a latex condom. Both female and female latex condoms may help you avoid infections spread by sexual contact. . Wear cotton underwear. Also wear pantyhose with a cotton crotch. If you feel comfortable without it, skip wearing underwear to bed. Yeast thrives in Marland Kitchen Your symptoms should improve in the next day or two.  GET HELP RIGHT AWAY IF:  . You have pain in your lower abdomen ( pelvic area or over your ovaries) . You develop nausea or vomiting . You develop a fever . Your discharge changes or worsens . You have persistent pain with intercourse . You develop shortness of breath, a rapid pulse, or you faint.  These symptoms could be signs of problems or infections that need to be evaluated by a medical provider now.  MAKE SURE  YOU    Understand these instructions.  Will watch your condition.  Will get help right away if you are not doing well or get worse.  Your e-visit answers were reviewed by a board certified advanced clinical practitioner to complete your personal care plan. Depending upon the condition, your  plan could have included both over the counter or prescription medications. Please review your pharmacy choice to make sure that you have choses a pharmacy that is open for you to pick up any needed prescription, Your safety is important to [place]. If you have drug allergies check your prescription carefully.   You can use MyChart to ask questions about today's visit, request a non-urgent call back, or ask for a work or school excuse for 24 hours related to this e-Visit. If it has been greater than 24 hours you will need to follow up with your provider, or enter a new e-Visit to address those concerns. You will get a MyChart message within the next two days asking about your experience. I hope that your e-visit has been valuable and will speed your recovery.  I spent 5-10 minutes reviewing patient's medical history and chart.

## 2020-02-25 ENCOUNTER — Other Ambulatory Visit: Payer: Self-pay

## 2020-02-25 ENCOUNTER — Emergency Department (HOSPITAL_COMMUNITY)
Admission: EM | Admit: 2020-02-25 | Discharge: 2020-02-25 | Disposition: A | Discharge status: Home / Self Care | Payer: Medicaid Other | Attending: Emergency Medicine | Admitting: Emergency Medicine

## 2020-02-25 ENCOUNTER — Encounter (HOSPITAL_COMMUNITY): Payer: Self-pay

## 2020-02-25 DIAGNOSIS — M549 Dorsalgia, unspecified: Secondary | ICD-10-CM | POA: Diagnosis present

## 2020-02-25 DIAGNOSIS — M5441 Lumbago with sciatica, right side: Secondary | ICD-10-CM | POA: Insufficient documentation

## 2020-02-25 DIAGNOSIS — I10 Essential (primary) hypertension: Secondary | ICD-10-CM | POA: Insufficient documentation

## 2020-02-25 DIAGNOSIS — F17210 Nicotine dependence, cigarettes, uncomplicated: Secondary | ICD-10-CM | POA: Insufficient documentation

## 2020-02-25 MED ORDER — KETOROLAC TROMETHAMINE 60 MG/2ML IM SOLN
60.0000 mg | Freq: Once | INTRAMUSCULAR | Status: AC
  Administered 2020-02-25: 60 mg via INTRAMUSCULAR
  Filled 2020-02-25: qty 2

## 2020-02-25 MED ORDER — LIDOCAINE 5 % EX PTCH: 1.0000 | MEDICATED_PATCH | CUTANEOUS | 0 refills | Status: DC

## 2020-02-25 MED ORDER — HYDROMORPHONE HCL 1 MG/ML IJ SOLN
1.0000 mg | Freq: Once | INTRAMUSCULAR | Status: AC
  Administered 2020-02-25: 1 mg via INTRAMUSCULAR
  Filled 2020-02-25: qty 1

## 2020-02-25 MED ORDER — LIDOCAINE 5 % EX PTCH
1.0000 | MEDICATED_PATCH | CUTANEOUS | Status: DC
  Administered 2020-02-25: 1 via TRANSDERMAL
  Filled 2020-02-25: qty 1

## 2020-02-25 NOTE — ED Provider Notes (Signed)
[facility]-EMERGENCY DEPT Provider Note   CSN: [id number] Arrival date & time: 02/25/20  1013    History Chief Complaint  Patient presents with  . Back Pain    Ellen Booker is a 39 y.o. female with past medical history significant for chronic back pain, hypertension who presents for evaluation of back pain.  Patient states she has had ongoing right back pain over the last 3 months.  Has been doing physical therapy and seeing her PCP for this.  She has an appointment next week to establish care with neurosurgery.  Has been on gabapentin, Mobic as well as prednisone.  Called her PCP yesterday and told her her pain was still constant and they recommended Norco.  She has taken 2 doses of this, 1 dose this morning as well as 1 dose yesterday evening.  Patient states the pain is not completely resolved with the Norco.  She denies any recent falls or injuries.  No bowel or bladder incontinence, saddle paresthesia, history IV drug use, malignancy.  Denies fever, chills, nausea vomiting, chest pain, shortness of breath, flank pain, abdominal pain, pelvic pain, vaginal discharge, weakness.  States occasionally does get numbness in her right lower extremity.  Describes her pain as sharp and stabbing.  Rates her pain a 10/10.  Took dose of Norco greater than 4 hours PTA.  She denies any urinary complaints.  Patient states her pain is similar to the pain that she has had over the last 3 months.  Denies additional aggravating or alleviating factors.  History obtained from patient and past medical records.  No interpreter is used.    HPI     Past Medical History:  Diagnosis Date  . Abdominal wall mass    left side  . Depression   . History of Helicobacter pylori infection    per pt 2016  . Renal cyst, right    per CT 04-04-2016  . Wears glasses     Patient Active Problem List   Diagnosis Date Noted  . Dysmenorrhea 02/01/2014  . Tobacco dependency 02/01/2014  . Obesity  12/09/2013  . Essential hypertension, benign 12/09/2013  . Candidiasis of vulva and vagina 12/08/2013  . Bilateral nipple discharge 11/08/2013  . 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 Unspecified disorder of menstruation and other abnormal bleeding from female genital tract 11/08/2013  . Irregular menstrual cycle 11/08/2013  . UTI (lower urinary tract infection) 01/29/2013  . 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 Well woman exam with routine gynecological exam 01/26/2013  . BMI 40.0-44.9, adult (HCC) 01/26/2013  . Elevated BP 01/26/2013    Past Surgical History:  Procedure Laterality Date  . CESAREAN SECTION  2010  . COLONOSCOPY  last one 06-19-2015  . EXCISION ABDOMINAL WALL MASS  2012   right side  . EXCISION MASS ABDOMINAL N/A 05/21/2016   Procedure: EXCISION ABDOMINAL WALL MASS;  Surgeon: De Blanch Kinsinger, MD;  Location: [facility] [place];  Service: General;  Laterality: N/A;  . RESECTION OF ABDOMINAL MASS       OB History    Gravida  1   Para  1   Term  1   Preterm      AB      Living  1     SAB      TAB      Ectopic      Multiple      Live Births  1  Family History  Problem Relation Age of Onset  . HIV Father   . Uterine cancer Other     Social History   Tobacco Use  . Smoking status: Current Every Day Smoker    Packs/day: 0.50    Years: 19.00    Pack years: 9.50    Types: Cigarettes  . Smokeless tobacco: Never Used  Vaping Use  . Vaping Use: Never used  Substance Use Topics  . Alcohol use: No    Alcohol/week: 0.0 standard drinks  . Drug use: No    Home Medications Prior to Admission medications   Medication Sig Start Date End Date Taking? Authorizing Provider  albuterol (VENTOLIN HFA) 108 (90 Base) MCG/ACT inhaler Inhale 2 puffs into the lungs every 4 (four) hours as needed for wheezing or shortness of breath. Patient not taking: Reported on 10/05/2018 07/27/18   Demetrio Lappingsman, Sahar M, PA-C  benzonatate  (TESSALON) 100 MG capsule Take 1 capsule (100 mg total) by mouth 3 (three) times daily as needed for cough. 10/30/18   Benjiman CoreWiseman, Brittany D, PA-C  buPROPion (WELLBUTRIN XL) 150 MG 24 hr tablet Take 150 mg by mouth daily.    [provider]  clindamycin (CLEOCIN) 2 % vaginal cream Place 1 Applicatorful vaginally at bedtime. 10/30/19   Daphine DeutscherMartin, Mary-Margaret, FNP  DULoxetine (CYMBALTA) 60 MG capsule TAKE 1 CAPSULE BY MOUTH TWICE DAILY FOR DEPRESSION ANXIETY 06/12/18   [provider]  Ferrous Sulfate (IRON PO) Take by mouth.    [provider]  fluconazole (DIFLUCAN) 150 MG tablet Take one tablet after finishing your antibiotic. 08/02/19   Roxy HorsemanBrowning, Robert, PA-C  FLUoxetine (PROZAC) 20 MG tablet Take 20 mg by mouth 2 (two) times daily.    [provider]  lamoTRIgine (LAMICTAL) 100 MG tablet Take 150 mg by mouth 2 (two) times daily.     [provider]  Levomilnacipran HCl (FETZIMA PO) Take 40 mg by mouth.    [provider]  lidocaine (LIDODERM) 5 % Place 1 patch onto the skin daily. Remove & Discard patch within 12 hours or as directed by MD 02/25/20   Buckley Bradly A, PA-C  metroNIDAZOLE (FLAGYL) 500 MG tablet Take 1 tablet (500 mg total) by mouth 2 (two) times daily. 10/30/19   Daphine DeutscherMartin, Mary-Margaret, FNP  phentermine (ADIPEX-P) 37.5 MG tablet Take 37.5 mg by mouth daily before breakfast.    [provider]  predniSONE (DELTASONE) 20 MG tablet Take 2 tablets (40 mg total) by mouth daily. 03/26/19   Roxy HorsemanBrowning, Robert, PA-C  Prenat w/o A-FeCbn-Meth-FA-DHA (PRENATE MINI) 29-0.6-0.4-350 MG CAPS Take 1 capsule by mouth daily before breakfast. 10/05/18   Brock BadHarper, Charles A, MD  Probiotic Product (PROBIOTIC-10 PO) Take by mouth.    [provider]  topiramate (TOPAMAX) 100 MG tablet TAKE 1 TABLET BY MOUTH AT BEDTIME FOR MOOD WEIGH 06/25/18   [provider]  varenicline (CHANTIX CONTINUING MONTH PAK) 1 MG tablet Take 1 tablet (1 mg total) by  mouth 2 (two) times daily. 02/01/19   Brock BadHarper, Charles A, MD  varenicline (CHANTIX STARTING MONTH PAK) 0.5 MG X 11 & 1 MG X 42 tablet Take one 0.5 mg tablet by mouth once daily for 3 days, then increase to one 0.5 mg tablet twice daily for 4 days, then increase to one 1 mg tablet twice daily. 02/01/19   Brock BadHarper, Charles A, MD  vortioxetine HBr (TRINTELLIX) 20 MG TABS tablet Take 20 mg by mouth daily.    [provider]  Allergies    Patient has no known allergies.  Review of Systems   Review of Systems  Constitutional: Negative.   HENT: Negative.   Respiratory: Negative.   Cardiovascular: Negative.   Gastrointestinal: Negative.   Genitourinary: Negative.   Musculoskeletal: Positive for back pain.  Skin: Negative.   Neurological: Negative.   All other systems reviewed and are negative.   Physical Exam Updated Vital Signs BP (!) 159/96 (BP Location: Right Arm)   Pulse (!) 104   Temp 98.7 F (37.1 C) (Oral)   Resp 17   Ht 5\' 4"  (1.626 m)   Wt 121.1 kg   LMP 02/21/2020   SpO2 95%   BMI 45.83 kg/m   Physical Exam  Physical Exam  Constitutional: Pt appears well-developed and well-nourished. No distress.  HENT:  Head: Normocephalic and atraumatic.  Mouth/Throat: Oropharynx is clear and moist. No oropharyngeal exudate.  Eyes: Conjunctivae are normal.  Neck: Normal range of motion. Neck supple.  Full ROM without pain  Cardiovascular: Normal rate, regular rhythm and intact distal pulses.   Pulmonary/Chest: Effort normal and breath sounds normal. No respiratory distress. Pt has no wheezes.  Abdominal: Soft. Pt exhibits no distension. There is no tenderness, rebound or guarding. No abd bruit or pulsatile mass Musculoskeletal:  Full range of motion of the T-spine and L-spine with flexion, hyperextension, and lateral flexion. No midline tenderness or stepoffs. No tenderness to palpation of the spinous processes of the T-spine or L-spine. Moderate tenderness to palpation of  the paraspinous muscles of the L-spine. Positive straight leg raise on right at 35'. Moderate tenderness over right piriformis. Lymphadenopathy:    Pt has no cervical adenopathy.  Neurological: Pt is alert. Pt has normal reflexes.  Reflex Scores:      Bicep reflexes are 2+ on the right side and 2+ on the left side.      Brachioradialis reflexes are 2+ on the right side and 2+ on the left side.      Patellar reflexes are 2+ on the right side and 2+ on the left side.      Achilles reflexes are 2+ on the right side and 2+ on the left side. Speech is clear and goal oriented, follows commands Normal 5/5 strength in upper and lower extremities bilaterally including dorsiflexion and plantar flexion, strong and equal grip strength Sensation normal to light and sharp touch Moves extremities without ataxia, coordination intact Normal gait Normal balance No Clonus Skin: Skin is warm and dry. No rash noted or lesions noted. Pt is not diaphoretic. No erythema, ecchymosis,edema or warmth.  Psychiatric: Pt has a normal mood and affect. Behavior is normal.  Nursing note and vitals reviewed. ED Results / Procedures / Treatments   Labs (all labs ordered are listed, but only abnormal results are displayed) Labs Reviewed - No data to display  EKG None  Radiology No results found.    MRI lumbar showed   1. Lumbar spondylosis most significant at L5-S1 with a large right paracentral disc extrusion causing severe central canal and right greater than left lateral recess stenosis with impingement upon the transversing S1 nerve roots  2. L4-5 mild central canal stenosis with moderate right neuroforaminal narrowing    Procedures Procedures (including critical care time)  Medications Ordered in ED Medications  lidocaine (LIDODERM) 5 % 1 patch (1 patch Transdermal Patch Applied 02/25/20 1131)  HYDROmorphone (DILAUDID) injection 1 mg (1 mg Intramuscular Given 02/25/20 1132)  ketorolac (TORADOL)  injection 60 mg (60 mg Intramuscular Given  02/25/20 1133)   ED Course  I have reviewed the triage vital signs and the nursing notes.  Pertinent labs & imaging results that were available during my care of the patient were reviewed by me and considered in my medical decision making (see chart for details).  39 year old presents for evaluation of acute on chronic back pain.  Afebrile, nonseptic, non-ill-appearing followed by PT as well as PCP for this.  Had MRI performed which showed disc bulge with impingement on right.  Prescribed Norco yesterday by PCP.  She has taken 2 doses of this.  Pain has not significantly improved.  No recent injury or trauma.  No bowel or bladder incontinence, saddle paresthesia, malignancy.  No urinary complaints.  No evidence of overlying skin changes on exam.  Does have positive straight leg on right.  Not visualized radiology report or imaging from our system however per her PCP note 02/15/2020 patient with large right paracentral disc extrusion right greater than left lateral recess stenosis with impingement on S1 nerve root as well as L4-5 mild central canal stenosis with moderate right foraminal narrowing.  We will give dose of IM pain medication here in the emergency department.  Discussed with patient will defer further narcotics outpatient given she is ready followed by PCP for this.  Will add on lidoderm patches. I discussed with patient to call her PCP to let them know she was seen here in ED for pain control. Has appointment next week with Neurosurgery.  Do not feel patient needs repeat imaging at this time.  I have low suspicion for acute neurosurgical emergency such as cauda equina, discitis, osteomyelitis, transverse myelitis, psoas abscess.  Pain controlled in ED. Will dc home with traction to follow-up with neurosurgery as well as PCP who prescribes her pain medication.  Will return for any worsening symptoms.  The patient has been appropriately medically  screened and/or stabilized in the ED. I have low suspicion for any other emergent medical condition which would require further screening, evaluation or treatment in the ED or require inpatient management.  Patient is hemodynamically stable and in no acute distress.  Patient able to ambulate in department prior to ED.  Evaluation does not show acute pathology that would require ongoing or additional emergent interventions while in the emergency department or further inpatient treatment.  I have discussed the diagnosis with the patient and answered all questions.  Pain is been managed while in the emergency department and patient has no further complaints prior to discharge.  Patient is comfortable with plan discussed in room and is stable for discharge at this time.  I have discussed strict return precautions for returning to the emergency department.  Patient was encouraged to follow-up with PCP/specialist refer to at discharge.    MDM Rules/Calculators/A&P                           Final Clinical Impression(s) / ED Diagnoses Final diagnoses:  Chronic right-sided low back pain with right-sided sciatica    Rx / DC Orders ED Discharge Orders         Ordered    lidocaine (LIDODERM) 5 %  Every 24 hours        02/25/20 1206           Iniko Robles A, PA-C 02/25/20 1207    Melene Plan, DO 02/25/20 1212

## 2020-02-25 NOTE — ED Triage Notes (Signed)
Patient states she has had right lower back pain x 3 months. Patient states she has been doing physical therapy and seeing her PCP. Patient states she has a surgical consult scheduled for 02/25/20.  Patient states she continues to have the right lower back pain, but is now radiating down the right leg and pain has increased to where she is having difficulty getting out of the bed.

## 2020-02-25 NOTE — Discharge Instructions (Signed)
I would suggest contacting your PCP for refill of your opiate medication that they filled yesterday when needed.  You may switch to Roxicodone that does not have Tylenol in this. That way you may increase your pain medication without worrying about taking too much need Tylenol. Continue your gabapentin, steroids provided by your PCP.  I have also filled lidocaine patches.  We have placed on here in the ED.  Please for 12 hours, remove for 12 hours.  Mostly patchy for 2 hours before you can place additional patch.  I would suggest contacting neurosurgery today to see if there is a cancellation that may get you in sooner.  Return for any worsening symptoms.

## 2020-05-17 ENCOUNTER — Telehealth: Payer: Medicaid Other | Admitting: Family

## 2020-05-17 DIAGNOSIS — B373 Candidiasis of vulva and vagina: Secondary | ICD-10-CM

## 2020-05-17 DIAGNOSIS — B3731 Acute candidiasis of vulva and vagina: Secondary | ICD-10-CM

## 2020-05-17 MED ORDER — FLUCONAZOLE 150 MG PO TABS: 150.0000 mg | ORAL_TABLET | ORAL | 0 refills | Status: DC | PRN

## 2020-05-17 NOTE — Progress Notes (Signed)

## 2020-05-30 ENCOUNTER — Other Ambulatory Visit (INDEPENDENT_AMBULATORY_CARE_PROVIDER_SITE_OTHER): Payer: Self-pay

## 2020-07-31 ENCOUNTER — Telehealth: Payer: Medicaid Other | Admitting: Physician Assistant

## 2020-07-31 DIAGNOSIS — B379 Candidiasis, unspecified: Secondary | ICD-10-CM

## 2020-07-31 DIAGNOSIS — T3695XA Adverse effect of unspecified systemic antibiotic, initial encounter: Secondary | ICD-10-CM | POA: Diagnosis not present

## 2020-07-31 DIAGNOSIS — R3989 Other symptoms and signs involving the genitourinary system: Secondary | ICD-10-CM

## 2020-07-31 MED ORDER — CEPHALEXIN 500 MG PO CAPS: 500.0000 mg | ORAL_CAPSULE | Freq: Two times a day (BID) | ORAL | 0 refills | Status: DC

## 2020-07-31 MED ORDER — FLUCONAZOLE 150 MG PO TABS: 150.0000 mg | ORAL_TABLET | Freq: Once | ORAL | 0 refills | Status: AC

## 2020-07-31 NOTE — Addendum Note (Signed)
Addended by: Margaretann Loveless on: 07/31/2020 01:59 PM   Modules accepted: Orders

## 2020-07-31 NOTE — Progress Notes (Signed)
We are sorry that you are not feeling well.  Here is how we plan to help!  Based on what you shared with me it looks like you most likely have a simple urinary tract infection.  A UTI (Urinary Tract Infection) is a bacterial infection of the bladder.  Most cases of urinary tract infections are simple to treat but a key part of your care is to encourage you to drink plenty of fluids and watch your symptoms carefully.  I have prescribed Keflex 500 mg twice a day for 7 days.  Your symptoms should gradually improve. Call us if the burning in your urine worsens, you develop worsening fever, back pain or pelvic pain or if your symptoms do not resolve after completing the antibiotic.  Urinary tract infections can be prevented by drinking plenty of water to keep your body hydrated.  Also be sure when you wipe, wipe from front to back and don't hold it in!  If possible, empty your bladder every 4 hours.  Your e-visit answers were reviewed by a board certified advanced clinical practitioner to complete your personal care plan.  Depending on the condition, your plan could have included both over the counter or prescription medications.  If there is a problem please reply  once you have received a response from your provider.  Your safety is important to [place].  If you have drug allergies check your prescription carefully.    You can use MyChart to ask questions about today's visit, request a non-urgent call back, or ask for a work or school excuse for 24 hours related to this e-Visit. If it has been greater than 24 hours you will need to follow up with your provider, or enter a new e-Visit to address those concerns.   You will get an e-mail in the next two days asking about your experience.  I hope that your e-visit has been valuable and will speed your recovery. Thank you for using e-visits.  I provided 6 minutes of non face-to-face time during this encounter for chart review and documentation.

## 2020-09-30 ENCOUNTER — Emergency Department (HOSPITAL_COMMUNITY): Payer: Medicaid Other

## 2020-09-30 ENCOUNTER — Other Ambulatory Visit: Payer: Self-pay

## 2020-09-30 ENCOUNTER — Emergency Department (HOSPITAL_COMMUNITY)
Admission: EM | Admit: 2020-09-30 | Discharge: 2020-09-30 | Disposition: A | Discharge status: Home / Self Care | Payer: Medicaid Other | Attending: Emergency Medicine | Admitting: Emergency Medicine

## 2020-09-30 DIAGNOSIS — R531 Weakness: Secondary | ICD-10-CM | POA: Diagnosis not present

## 2020-09-30 DIAGNOSIS — M5417 Radiculopathy, lumbosacral region: Secondary | ICD-10-CM

## 2020-09-30 DIAGNOSIS — M545 Low back pain, unspecified: Secondary | ICD-10-CM | POA: Diagnosis present

## 2020-09-30 DIAGNOSIS — M5416 Radiculopathy, lumbar region: Secondary | ICD-10-CM | POA: Insufficient documentation

## 2020-09-30 DIAGNOSIS — N9489 Other specified conditions associated with female genital organs and menstrual cycle: Secondary | ICD-10-CM | POA: Diagnosis not present

## 2020-09-30 DIAGNOSIS — F17210 Nicotine dependence, cigarettes, uncomplicated: Secondary | ICD-10-CM | POA: Diagnosis not present

## 2020-09-30 DIAGNOSIS — Z79899 Other long term (current) drug therapy: Secondary | ICD-10-CM | POA: Diagnosis not present

## 2020-09-30 DIAGNOSIS — R030 Elevated blood-pressure reading, without diagnosis of hypertension: Secondary | ICD-10-CM | POA: Insufficient documentation

## 2020-09-30 LAB — I-STAT BETA HCG BLOOD, ED (MC, WL, AP ONLY): I-stat hCG, quantitative: <5 m[IU]/mL (ref <5)

## 2020-09-30 MED ORDER — DEXAMETHASONE SODIUM PHOSPHATE 10 MG/ML IJ SOLN
10.0000 mg | Freq: Once | INTRAMUSCULAR | Status: AC
  Administered 2020-09-30: 10 mg via INTRAVENOUS
  Filled 2020-09-30: qty 1

## 2020-09-30 MED ORDER — MORPHINE SULFATE (PF) 4 MG/ML IV SOLN
4.0000 mg | Freq: Once | INTRAVENOUS | Status: AC
  Administered 2020-09-30: 4 mg via INTRAVENOUS
  Filled 2020-09-30: qty 1

## 2020-09-30 MED ORDER — KETOROLAC TROMETHAMINE 10 MG PO TABS: 10.0000 mg | ORAL_TABLET | Freq: Four times a day (QID) | ORAL | 0 refills | Status: DC | PRN

## 2020-09-30 MED ORDER — KETOROLAC TROMETHAMINE 30 MG/ML IJ SOLN
30.0000 mg | Freq: Once | INTRAMUSCULAR | Status: AC
  Administered 2020-09-30: 30 mg via INTRAVENOUS
  Filled 2020-09-30: qty 1

## 2020-09-30 MED ORDER — METHYLPREDNISOLONE 4 MG PO TBPK: ORAL_TABLET | ORAL | 0 refills | Status: DC

## 2020-09-30 MED ORDER — ONDANSETRON HCL 4 MG/2ML IJ SOLN
4.0000 mg | Freq: Once | INTRAMUSCULAR | Status: AC
  Administered 2020-09-30: 4 mg via INTRAVENOUS
  Filled 2020-09-30: qty 2

## 2020-09-30 NOTE — Discharge Instructions (Addendum)
Please take the medrol dose pack and toradol as prescribed.   You will need to follow-up with your neurosurgeon at 1130 on Monday morning, 10/02/2020 for reassessment.  If in the meantime you have any new or worsening symptoms including any numbness weakness in the legs or any loss control of bowel or bladder function they should return to the emergency department immediately.

## 2020-09-30 NOTE — ED Triage Notes (Signed)
Patient reports lower back and right leg pain x5 days ago. Say she recently had spinal fusion surgery, pain 8/10.

## 2020-09-30 NOTE — ED Notes (Signed)
Dr Petra Kuba answering service notified of consult

## 2020-09-30 NOTE — ED Provider Notes (Signed)
Ellen Booker   CSN: [id number] Arrival date & time: 09/30/20  1002     History Chief Complaint  Patient presents with   Back Pain    Ellen Booker is a 40 y.o. female.  HPI   Pt is a 40 y/o female with a h/o depression, h poylori, renal cyst, who presents to the ED today for eval of back pain that started 5 days ago. Pain located to the middle of the lower back and radiates down the RLE. Rates pain 8/10.  Reports intermittent weakness to the RLE. Denies numbness to the BLE. Denies saddle anesthesia. Denies loss of control of bowels or bladder. No urinary retention. No fevers. Denies a h/o IVDU. Denies a h/o CA. She has tried aleve and tramadol without resolution of symptoms. Denies any recent heavy lifting, falls or trauma.  Pt had a lumbar fusion in 04/2020 with Dr. Petra Kuba at [facility]. She has an appt there in August.    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   Unspecified disorder of menstruation and other abnormal bleeding from female genital tract 11/08/2013   Irregular menstrual cycle 11/08/2013   UTI (lower urinary tract infection) 01/29/2013   HSV (herpes simplex virus) infection 01/26/2013   Well woman exam with routine gynecological exam 01/26/2013   BMI 40.0-44.9, adult (HCC) 01/26/2013   Elevated BP 01/26/2013    Past Surgical History:  Procedure Laterality Date   CESAREAN SECTION  2010   COLONOSCOPY  last one 06-19-2015   EXCISION ABDOMINAL  WALL MASS  2012   right side   EXCISION MASS ABDOMINAL N/A 05/21/2016   Procedure: EXCISION ABDOMINAL WALL MASS;  Surgeon: De Blanch Kinsinger, MD;  Location: [facility] [place];  Service: General;  Laterality: N/A;   RESECTION OF ABDOMINAL MASS       OB History     Gravida  1   Para  1   Term  1   Preterm      AB      Living  1      SAB      IAB      Ectopic      Multiple      Live Births  1           Family History  Problem Relation Age of Onset   HIV Father    Uterine cancer Other     Social History   Tobacco Use   Smoking status: Every Day    Packs/day: 0.50    Years: 19.00    Pack years: 9.50    Types: Cigarettes   Smokeless tobacco: Never  Vaping Use   Vaping Use: Never used  Substance Use Topics   Alcohol use: No    Alcohol/week: 0.0 standard drinks   Drug use: No    Home Medications Prior to Admission medications   Medication Sig Start Date End Date Taking? Authorizing Provider  ketorolac (TORADOL) 10 MG tablet Take 1 tablet (10 mg total) by mouth every 6 (six) hours as needed. 09/30/20  Yes Areebah Meinders S, PA-C  methylPREDNISolone (MEDROL DOSEPAK) 4 MG TBPK tablet Per package instructions 09/30/20  Yes Iveliz Garay S, PA-C  cephALEXin (KEFLEX) 500 MG capsule Take 1 capsule (500 mg total) by mouth 2 (two) times daily. 07/31/20   Margaretann Loveless, PA-C  clindamycin (CLEOCIN) 2 % vaginal cream Place 1 Applicatorful vaginally at bedtime. 10/30/19   Daphine Deutscher, Mary-Margaret, FNP  Ferrous Sulfate (IRON PO) Take by mouth.    [provider]  fluconazole (DIFLUCAN) 150 MG tablet Take 1 tablet (150 mg total) by mouth every three (3) days as needed. 05/17/20   Junie Spencer, FNP  lamoTRIgine (LAMICTAL) 100 MG tablet Take 150 mg by mouth 2 (two) times daily.     [provider]  lamoTRIgine (LAMICTAL) 150 MG tablet Take 150 mg by mouth 2 (two) times daily. 09/29/20   [provider]  lidocaine (LIDODERM) 5 % Place  1 patch onto the skin daily. Remove & Discard patch within 12 hours or as directed by MD 02/25/20   Henderly, Britni A, PA-C  metroNIDAZOLE (FLAGYL) 500 MG tablet Take 1 tablet (500 mg total) by mouth 2 (two) times daily. 10/30/19   Daphine Deutscher, Mary-Margaret, FNP  predniSONE (DELTASONE) 20 MG tablet Take 2 tablets (40 mg total) by mouth daily. 03/26/19   Roxy Horseman, PA-C  Prenat w/o A-FeCbn-Meth-FA-DHA (PRENATE MINI) 29-0.6-0.4-350 MG CAPS Take 1 capsule by mouth daily before breakfast. 10/05/18   Brock Bad, MD  Probiotic Product (PROBIOTIC-10 PO) Take by mouth.    [provider]  TRINTELLIX 5 MG TABS tablet Take 5 mg by mouth daily. 08/25/20   [provider]  varenicline (CHANTIX CONTINUING MONTH PAK) 1 MG tablet Take 1 tablet (1 mg total) by mouth 2 (two) times daily. 02/01/19   Brock Bad, MD  varenicline (CHANTIX STARTING MONTH PAK) 0.5 MG X 11 & 1 MG X 42 tablet Take one 0.5 mg tablet by mouth once daily for 3 days, then increase to one 0.5 mg tablet twice daily for 4 days, then increase to one 1 mg tablet twice daily. 02/01/19   Brock Bad, MD  vortioxetine HBr (TRINTELLIX) 20 MG TABS tablet Take 20 mg by mouth daily.    [provider]  VRAYLAR 4.5 MG CAPS Take 4.5 mg by mouth daily. 08/25/20   [provider]  VYVANSE 50 MG capsule Take 50 mg by mouth every morning. 08/25/20   [provider]    Allergies    Patient has no known allergies.  Review of Systems   Review of Systems  Constitutional:  Negative for fever.  Respiratory:  Negative for shortness of breath.   Cardiovascular:  Negative for chest pain.  Gastrointestinal:  Negative for abdominal pain, blood in stool, constipation, diarrhea, nausea and vomiting.  Genitourinary:  Negative for dysuria, flank pain, frequency, hematuria and urgency.  Musculoskeletal:  Positive for back pain. Negative for gait problem.  Skin:  Negative for wound.  Neurological:  Positive for  weakness. Negative for numbness.   Physical Exam Updated Vital Signs BP (!) 138/95   Pulse 89   Temp 98 F (36.7 C) (Oral)   Resp 16   Ht 5\' 4"  (1.626 m)   Wt 117.9 kg   LMP 09/09/2020   SpO2 100%   BMI 44.63 kg/m   Physical Exam Vitals and nursing Booker  reviewed.  Constitutional:      General: She is not in acute distress.    Appearance: She is well-developed.  HENT:     Head: Normocephalic and atraumatic.  Eyes:     Conjunctiva/sclera: Conjunctivae normal.  Cardiovascular:     Rate and Rhythm: Normal rate and regular rhythm.     Heart sounds: No murmur heard. Pulmonary:     Effort: Pulmonary effort is normal. No respiratory distress.     Breath sounds: Normal breath sounds.  Abdominal:     Palpations: Abdomen is soft.     Tenderness: There is no abdominal tenderness.  Musculoskeletal:     Cervical back: Neck supple.     Comments: TTP to the lumbar spine over the previous incision site. TTP over the sciatic notch and SI joint. Slightly decreased strength noted to the RLE and slightly decreased sensation noted as well.   Skin:    General: Skin is warm and dry.  Neurological:     Mental Status: She is alert.    ED Results / Procedures / Treatments   Labs (all labs ordered are listed, but only abnormal results are displayed) Labs Reviewed  I-STAT BETA HCG BLOOD, ED ([facility], [facility], [facility] ONLY)    EKG None  Radiology MR LUMBAR SPINE WO CONTRAST  Result Date: 09/30/2020 CLINICAL DATA:  Low back pain radiating into the right leg to the mid thigh for 5 days. The patient underwent lumbar surgery in February, 2022. EXAM: MRI LUMBAR SPINE WITHOUT CONTRAST TECHNIQUE: Multiplanar, multisequence MR imaging of the lumbar spine was performed. No intravenous contrast was administered. COMPARISON:  None. FINDINGS: Segmentation:  Standard. Alignment:  Normal. Vertebrae: No fracture, evidence of discitis, or bone lesion. Degenerative endplate signal change is seen at L5-S1 eccentric to the  right. Conus medullaris and cauda equina: Conus extends to the L1 level. Conus and cauda equina appear normal. Paraspinal and other soft tissues: Right renal cyst noted. Disc levels: The T11-12 to L3-4 levels are negative. L4-5: There is disc desiccation and a shallow broad-based bulge. Mild facet degenerative disease and ligamentum flavum thickening are seen. Mild central canal and bilateral foraminal narrowing. L5-S1: Status post posterior decompression. There is moderate facet arthropathy. A large right paracentral and subarticular recess disc protrusion deforms the right aspect of the thecal sac and impinges on the right S1 root. Mild to moderate foraminal narrowing is worse on the right. IMPRESSION: Status post L5-S1 decompression. A large right paracentral and subarticular recess disc protrusion impinges on the right S1 root and deforms the right side of the thecal sac. There is also moderate right foraminal narrowing at this level. Mild central canal and bilateral foraminal narrowing L4-5. Electronically Signed   By: Drusilla Kannerhomas  Dalessio M.D.   On: 09/30/2020 12:49    Procedures Procedures   Medications Ordered in ED Medications  ondansetron (ZOFRAN) injection 4 mg (4 mg Intravenous Given 09/30/20 1257)  morphine 4 MG/ML injection 4 mg (4 mg Intravenous Given 09/30/20 1258)  ketorolac (TORADOL) 30 MG/ML injection 30 mg (30 mg Intravenous Given 09/30/20 1258)  dexamethasone (DECADRON) injection 10 mg (10 mg Intravenous Given 09/30/20 1258)    ED Course  I have reviewed the triage vital signs and the nursing notes.  Pertinent labs & imaging results that were available during my care of the patient were reviewed by me and considered in my medical decision making (see chart for details).    MDM Rules/Calculators/A&P  40 y/o F with previous lumbar fusion presenting for eval of lower back pain radiating to the RLE. C/o some weakness and decreased sensation to the rle.   MRI  reviewed/interpreted -  Status post L5-S1 decompression. A large right paracentral and subarticular recess disc protrusion impinges on the right S1 root and deforms the right side of the thecal sac. There is also moderate right foraminal narrowing at this level. Mild central canal and bilateral foraminal narrowing L4-5.   Pt given steroids, pain meds, antiinflammatories and on reassessment she states she feels much improved and she no longer has any weakness or numbness to the right lower extremity.  2:07 PM CONSULT with the patient's neurosurgeon, Dr. Petra Kuba who recommended a Medrol Dosepak, oral Toradol and having the patient follow-up in their [facility] clinic at 1130 on Monday.  Reassessed patient and discussed plan for follow-up and plan for discharge on medications listed above.  Advised on strict return precautions for new or worsening symptoms in the meantime.  She voices understanding the plan and reasons to return.  Questions answered.  Patient stable for discharge  Final Clinical Impression(s) / ED Diagnoses Final diagnoses:  Lumbosacral radiculopathy at S1    Rx / DC Orders ED Discharge Orders          Ordered    ketorolac (TORADOL) 10 MG tablet  Every 6 hours PRN        09/30/20 1417    methylPREDNISolone (MEDROL DOSEPAK) 4 MG TBPK tablet        09/30/20 1417             Binnie Vonderhaar S, PA-C 09/30/20 1417    Arby Barrette, MD 10/11/20 [phone]

## 2020-09-30 NOTE — ED Notes (Signed)
Patient transported to MRI 

## 2021-06-22 ENCOUNTER — Telehealth: Payer: Medicaid Other | Admitting: Family Medicine

## 2021-06-22 DIAGNOSIS — B3731 Acute candidiasis of vulva and vagina: Secondary | ICD-10-CM

## 2021-06-22 DIAGNOSIS — B9689 Other specified bacterial agents as the cause of diseases classified elsewhere: Secondary | ICD-10-CM

## 2021-06-22 DIAGNOSIS — N760 Acute vaginitis: Secondary | ICD-10-CM | POA: Diagnosis not present

## 2021-06-22 MED ORDER — FLUCONAZOLE 150 MG PO TABS: 150.0000 mg | ORAL_TABLET | Freq: Once | ORAL | 0 refills | Status: AC

## 2021-06-22 MED ORDER — METRONIDAZOLE 500 MG PO TABS
500.0000 mg | ORAL_TABLET | Freq: Three times a day (TID) | ORAL | 0 refills | Status: AC
Start: 2021-06-22 — End: 2021-06-29

## 2021-06-22 NOTE — Progress Notes (Signed)
E-Visit for Vaginal Symptoms  We are sorry that you are not feeling well. Here is how we plan to help! Based on what you shared with me it looks like you: May have a vaginosis due to bacteria  Vaginosis is an inflammation of the vagina that can result in discharge, itching and pain. The cause is usually a change in the normal balance of vaginal bacteria or an infection. Vaginosis can also result from reduced estrogen levels after menopause.  The most common causes of vaginosis are:   Bacterial vaginosis which results from an overgrowth of one on several organisms that are normally present in your vagina.   Yeast infections which are caused by a naturally occurring fungus called candida.   Vaginal atrophy (atrophic vaginosis) which results from the thinning of the vagina from reduced estrogen levels after menopause.   Trichomoniasis which is caused by a parasite and is commonly transmitted by sexual intercourse.  Factors that increase your risk of developing vaginosis include: Medications, such as antibiotics and steroids Uncontrolled diabetes Use of hygiene products such as bubble bath, vaginal spray or vaginal deodorant Douching Wearing damp or tight-fitting clothing Using an intrauterine device (IUD) for birth control Hormonal changes, such as those associated with pregnancy, birth control pills or menopause Sexual activity Having a sexually transmitted infection  Your treatment plan is Metronidazole or Flagyl 500mg twice a day for 7 days.  I have electronically sent this prescription into the pharmacy that you have chosen.  Be sure to take all of the medication as directed. Stop taking any medication if you develop a rash, tongue swelling or shortness of breath. Mothers who are breast feeding should consider pumping and discarding their breast milk while on these antibiotics. However, there is no consensus that infant exposure at these doses would be harmful.  Remember that  medication creams can weaken latex condoms. .   HOME CARE:  Good hygiene may prevent some types of vaginosis from recurring and may relieve some symptoms:  Avoid baths, hot tubs and whirlpool spas. Rinse soap from your outer genital area after a shower, and dry the area well to prevent irritation. Don't use scented or harsh soaps, such as those with deodorant or antibacterial action. Avoid irritants. These include scented tampons and pads. Wipe from front to back after using the toilet. Doing so avoids spreading fecal bacteria to your vagina.  Other things that may help prevent vaginosis include:  Don't douche. Your vagina doesn't require cleansing other than normal bathing. Repetitive douching disrupts the normal organisms that reside in the vagina and can actually increase your risk of vaginal infection. Douching won't clear up a vaginal infection. Use a latex condom. Both female and female latex condoms may help you avoid infections spread by sexual contact. Wear cotton underwear. Also wear pantyhose with a cotton crotch. If you feel comfortable without it, skip wearing underwear to bed. Yeast thrives in moist environments Your symptoms should improve in the next day or two.  GET HELP RIGHT AWAY IF:  You have pain in your lower abdomen ( pelvic area or over your ovaries) You develop nausea or vomiting You develop a fever Your discharge changes or worsens You have persistent pain with intercourse You develop shortness of breath, a rapid pulse, or you faint.  These symptoms could be signs of problems or infections that need to be evaluated by a medical provider now.  MAKE SURE YOU   Understand these instructions. Will watch your condition. Will get help right   away if you are not doing well or get worse.  Thank you for choosing an e-visit.  Your e-visit answers were reviewed by a board certified advanced clinical practitioner to complete your personal care plan. Depending upon the  condition, your plan could have included both over the counter or prescription medications.  Please review your pharmacy choice. Make sure the pharmacy is open so you can pick up prescription now. If there is a problem, you may contact your provider through MyChart messaging and have the prescription routed to another pharmacy.  Your safety is important to us. If you have drug allergies check your prescription carefully.   For the next 24 hours you can use MyChart to ask questions about today's visit, request a non-urgent call back, or ask for a work or school excuse. You will get an email in the next two days asking about your experience. I hope that your e-visit has been valuable and will speed your recovery.   I have provided 5 minutes of non face to face time during this encounter for chart review and documentation.   

## 2021-08-23 HISTORY — PX: OTHER SURGICAL HISTORY: SHX169

## 2021-09-20 ENCOUNTER — Encounter (HOSPITAL_COMMUNITY): Payer: Self-pay | Admitting: Emergency Medicine

## 2021-09-20 ENCOUNTER — Other Ambulatory Visit: Payer: Self-pay

## 2021-09-20 ENCOUNTER — Emergency Department (HOSPITAL_COMMUNITY)
Admission: EM | Admit: 2021-09-20 | Discharge: 2021-09-20 | Disposition: A | Discharge status: Home / Self Care | Payer: Medicaid Other | Attending: Emergency Medicine | Admitting: Emergency Medicine

## 2021-09-20 DIAGNOSIS — T8130XA Disruption of wound, unspecified, initial encounter: Secondary | ICD-10-CM

## 2021-09-20 DIAGNOSIS — T8131XA Disruption of external operation (surgical) wound, not elsewhere classified, initial encounter: Secondary | ICD-10-CM | POA: Diagnosis present

## 2021-09-20 DIAGNOSIS — Y69 Unspecified misadventure during surgical and medical care: Secondary | ICD-10-CM | POA: Insufficient documentation

## 2021-09-20 NOTE — Discharge Instructions (Signed)
Follow-up with your surgeon to have him recheck on the wound.  Continue to apply antibiotic to the wound and change sterile dressing daily as I demonstrated in the ED.  Return immediately for fevers chills severe pain or other concerning symptoms

## 2021-09-20 NOTE — ED Provider Notes (Signed)
[facility] EMERGENCY DEPARTMENT Provider Note   CSN: [id number] Arrival date & time: 09/20/21  1223     History  Chief Complaint  Patient presents with   Wound Dehiscence    Ellen Booker is a 41 y.o. female.  HPI  Patient presented to the ER for evaluation of a wound dehiscence.  Patient had spinal surgery performed on June 8 at another medical facility.  Outside records reviewed and patient had an L5-S1 lumbar anterior interbody fusion supplemented with posterior instrumentation.  Patient states this morning she noticed a small area on her lower abdomen where the sutures opened up.  Patient states she did feel small amount of fluid drained when that occurred.  Patient denies any fevers or chills.  She is not having any back pain.  She is not having any significant abdominal discomfort.  The patient was concerned and came to the emergency room for evaluation  Home Medications Prior to Admission medications   Medication Sig Start Date End Date Taking? Authorizing Provider  cephALEXin (KEFLEX) 500 MG capsule Take 1 capsule (500 mg total) by mouth 2 (two) times daily. 07/31/20   Margaretann Loveless, PA-C  clindamycin (CLEOCIN) 2 % vaginal cream Place 1 Applicatorful vaginally at bedtime. 10/30/19   Daphine Deutscher, Mary-Margaret, FNP  Ferrous Sulfate (IRON PO) Take by mouth.    [provider]  fluconazole (DIFLUCAN) 150 MG tablet Take 1 tablet (150 mg total) by mouth every three (3) days as needed. 05/17/20   Junie Spencer, FNP  ketorolac (TORADOL) 10 MG tablet Take 1 tablet (10 mg total) by mouth every 6 (six) hours as needed. 09/30/20   Couture, Cortni S, PA-C  lamoTRIgine (LAMICTAL) 100 MG tablet Take 150 mg by mouth 2 (two) times daily.     [provider]  lamoTRIgine (LAMICTAL) 150 MG tablet Take 150 mg by mouth 2 (two) times daily. 09/29/20   [provider]  lidocaine (LIDODERM) 5 % Place 1 patch onto the skin daily. Remove & Discard patch  within 12 hours or as directed by MD 02/25/20   Henderly, Britni A, PA-C  methylPREDNISolone (MEDROL DOSEPAK) 4 MG TBPK tablet Per package instructions 09/30/20   Couture, Cortni S, PA-C  metroNIDAZOLE (FLAGYL) 500 MG tablet Take 1 tablet (500 mg total) by mouth 2 (two) times daily. 10/30/19   Daphine Deutscher, Mary-Margaret, FNP  predniSONE (DELTASONE) 20 MG tablet Take 2 tablets (40 mg total) by mouth daily. 03/26/19   Roxy Horseman, PA-C  Prenat w/o A-FeCbn-Meth-FA-DHA (PRENATE MINI) 29-0.6-0.4-350 MG CAPS Take 1 capsule by mouth daily before breakfast. 10/05/18   Brock Bad, MD  Probiotic Product (PROBIOTIC-10 PO) Take by mouth.    [provider]  TRINTELLIX 5 MG TABS tablet Take 5 mg by mouth daily. 08/25/20   [provider]  varenicline (CHANTIX CONTINUING MONTH PAK) 1 MG tablet Take 1 tablet (1 mg total) by mouth 2 (two) times daily. 02/01/19   Brock Bad, MD  varenicline (CHANTIX STARTING MONTH PAK) 0.5 MG X 11 & 1 MG X 42 tablet Take one 0.5 mg tablet by mouth once daily for 3 days, then increase to one 0.5 mg tablet twice daily for 4 days, then increase to one 1 mg tablet twice daily. 02/01/19   Brock Bad, MD  vortioxetine HBr (TRINTELLIX) 20 MG TABS tablet Take 20 mg by mouth daily.    [provider]  VRAYLAR 4.5 MG CAPS Take 4.5 mg by mouth daily. 08/25/20  [provider]  VYVANSE 50 MG capsule Take 50 mg by mouth every morning. 08/25/20   [provider]      Allergies    Patient has no known allergies.    Review of Systems   Review of Systems  Physical Exam Updated Vital Signs BP 118/61   Pulse 82   Temp 98.1 F (36.7 C) (Oral)   Resp 14   SpO2 99%  Physical Exam Vitals and nursing note reviewed.  Constitutional:      General: She is not in acute distress.    Appearance: She is well-developed.  HENT:     Head: Normocephalic and atraumatic.     Right Ear: External ear normal.     Left Ear: External ear normal.  Eyes:      General: No scleral icterus.       Right eye: No discharge.        Left eye: No discharge.     Conjunctiva/sclera: Conjunctivae normal.  Neck:     Trachea: No tracheal deviation.  Cardiovascular:     Rate and Rhythm: Normal rate.  Pulmonary:     Effort: Pulmonary effort is normal. No respiratory distress.     Breath sounds: No stridor.  Abdominal:     Palpations: There is no mass.     Tenderness: There is no abdominal tenderness.     Comments: Patient has an abdominal wound that appears to be healing well, at the base of the abdominal wound above the suprapubic region there is approximately a 2 cm area of wound dehiscence, the base of this wound is approximately 4 mm deep, there is healthy granulation and adipose tissue without evident evidence of purulent drainage  Musculoskeletal:        General: No swelling or deformity.     Cervical back: Neck supple.  Skin:    General: Skin is warm and dry.     Findings: No rash.  Neurological:     Mental Status: She is alert.     Cranial Nerves: Cranial nerve deficit: no gross deficits.      ED Results / Procedures / Treatments   Labs (all labs ordered are listed, but only abnormal results are displayed) Labs Reviewed - No data to display  EKG None  Radiology No results found.  Procedures Procedures    Medications Ordered in ED Medications - No data to display  ED Course/ Medical Decision Making/ A&P                           Medical Decision Making Problems Addressed: Wound dehiscence: self-limited or minor problem  Amount and/or Complexity of Data Reviewed External Data Reviewed: notes. Discussion of management or test interpretation with external provider(s): Reviewed notes from patient's spine surgery at [facility]   Patient appears to have small wound dehiscence that is superficial in nature at the base of her abdominal wound.  There is no evidence of infection.  I personally irrigated the wound.  Applied  antibiotic ointment and lightly packed the wound with a sterile 2 x 2 and then placed a sterile 4 x 4.  Showed the husband how to apply sterile dressing.  Recommend daily dressing changes.  Recommend outpatient follow-up with her surgeon to have the wound rechecked        Final Clinical Impression(s) / ED Diagnoses Final diagnoses:  Wound dehiscence    Rx / DC Orders ED Discharge Orders  None         Linwood Dibbles, MD 09/20/21 1359

## 2021-09-20 NOTE — ED Triage Notes (Signed)
Patient here with complaint of wound dehiscence this morning, reports back surgery on June 8. Reports no issues with surgery prior to today. Patient is alert, oriented, and in no apparent distress at this time.

## 2022-01-16 ENCOUNTER — Encounter (HOSPITAL_BASED_OUTPATIENT_CLINIC_OR_DEPARTMENT_OTHER): Payer: Self-pay | Admitting: Physical Therapy

## 2022-01-16 ENCOUNTER — Ambulatory Visit (HOSPITAL_BASED_OUTPATIENT_CLINIC_OR_DEPARTMENT_OTHER): Payer: Medicaid Other | Attending: Nurse Practitioner | Admitting: Physical Therapy

## 2022-01-16 DIAGNOSIS — R262 Difficulty in walking, not elsewhere classified: Secondary | ICD-10-CM | POA: Diagnosis present

## 2022-01-16 DIAGNOSIS — Z981 Arthrodesis status: Secondary | ICD-10-CM | POA: Diagnosis not present

## 2022-01-16 DIAGNOSIS — M5459 Other low back pain: Secondary | ICD-10-CM | POA: Insufficient documentation

## 2022-01-16 DIAGNOSIS — M6281 Muscle weakness (generalized): Secondary | ICD-10-CM | POA: Insufficient documentation

## 2022-01-16 DIAGNOSIS — M48061 Spinal stenosis, lumbar region without neurogenic claudication: Secondary | ICD-10-CM | POA: Diagnosis not present

## 2022-01-16 NOTE — Therapy (Signed)
OUTPATIENT PHYSICAL THERAPY THORACOLUMBAR EVALUATION   Patient Name: Ellen Booker MRN: [id number] DOB:[birth year], 41 y.o., female Today's Date: 01/17/2022   PT End of Session - 01/16/22 0913     Visit Number 1    Number of Visits 5   5 visits left per insurance   Date for PT Re-Evaluation 03/01/22    Authorization Type [place] [employer]    PT Start Time 0920    PT Stop Time 1010    PT Time Calculation (min) 50 min    Activity Tolerance Patient tolerated treatment well    Behavior During Therapy [facility] for tasks assessed/performed             Past Medical History:  Diagnosis Date   Abdominal wall mass    left side   Depression    History of Helicobacter pylori infection    per pt 2016   Renal cyst, right    per CT 04-04-2016   Wears glasses    Past Surgical History:  Procedure Laterality Date   CESAREAN SECTION  2010   COLONOSCOPY  last one 06-19-2015   EXCISION ABDOMINAL WALL MASS  2012   right side   EXCISION MASS ABDOMINAL N/A 05/21/2016   Procedure: EXCISION ABDOMINAL WALL MASS;  Surgeon: Rodman Pickle, MD;  Location: [facility];  Service: General;  Laterality: N/A;   RESECTION OF ABDOMINAL MASS     Patient Active Problem List   Diagnosis Date Noted   Dysmenorrhea 02/01/2014   Tobacco dependency 02/01/2014   Obesity 12/09/2013   Essential hypertension, benign 12/09/2013   Candidiasis of vulva and vagina 12/08/2013   Bilateral nipple discharge 11/08/2013   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   Unspecified disorder of menstruation and other abnormal bleeding from female genital tract 11/08/2013   Irregular menstrual cycle 11/08/2013   UTI (lower urinary tract infection) 01/29/2013   HSV (herpes simplex virus) infection 01/26/2013   Well woman exam with routine gynecological exam 01/26/2013   BMI 40.0-44.9, adult (HCC) 01/26/2013   Elevated BP 01/26/2013     REFERRING PROVIDER: Jonetta Speak, NP  REFERRING DIAG:  Z98.1  (ICD-10-CM) - Arthrodesis status  M48.061 (ICD-10-CM) - Spinal stenosis, lumbar region without neurogenic claudication    Rationale for Evaluation and Treatment Rehabilitation  THERAPY DIAG:  Other low back pain  Muscle weakness (generalized)  Difficulty in walking, not elsewhere classified  ONSET DATE: approx 03/25/2020  SUBJECTIVE:  SUBJECTIVE STATEMENT: 3rd surgery to the same place in my back in 1.5 years. Been going to PT at [facility] (10 visits). Iam not building any strength. I can put my thumb right on the pain (Rt SIJ joint). I can walk around my house without a cane, anything outside I do need it. I have a slight hill on driveway that is very difficult to navigate due to pain.   PERTINENT HISTORY:  Multiple spinal surgeries, obesity  PAIN:  Are you having pain? Yes: NPRS scale: 5/10 Pain location: Rt SIJ Pain description: sharp Aggravating factors: standing Relieving factors: rest  PRECAUTIONS: None  WEIGHT BEARING RESTRICTIONS: No  FALLS:  Has patient fallen in last 6 months? No   PLOF: Independent  PATIENT GOALS: improve strength via aquatics   OBJECTIVE:   DIAGNOSTIC FINDINGS:  Lumbar MRI 09/30/20: IMPRESSION: Status post L5-S1 decompression. A large right paracentral and subarticular recess disc protrusion impinges on the right S1 root and deforms the right side of the thecal sac. There is also moderate right foraminal narrowing at this level.   Mild central canal and bilateral foraminal narrowing L4-5.  PATIENT SURVEYS:  Modified ODI 20  COGNITION: Overall cognitive status: Within functional limits for tasks assessed     SENSATION: WFL   POSTURE: Lt post innom with Lt upper trunk shift Bil knee hyperext in standing- hanging on the ligaments  posture  PALPATION: TTP Rt SIJ  LUMBAR ROM:   AROM eval  Flexion   Extension   Right lateral flexion   Left lateral flexion   Right rotation   Left rotation    (Blank rows = not tested)  LOWER EXTREMITY MMT:    At eval able to demo lifting bil LEs against gravity  FUNCTIONAL TESTS:  5 times sit to stand: required UEs  GAIT: Pt ambulated to & from the pool, without rest break, using SPC that we discussed reducing pressure on as it is encouraging lumbar shift pt reported significant fatigue with this walk that was approx 400 ft  TODAY'S TREATMENT:                                                                                                                               Treatment                            eval 01/16/22:  Hesch self correction for Lt post innom rotation Posture/ab set in seated and standing Standing glut set Bridge mini swiss ball, sit to stand and hip hinge with mini swiss ball     PATIENT EDUCATION:  Education details: Teacher, music of condition, POC, HEP, exercise form/rationale Person educated: Patient Education method: Explanation, Demonstration, Tactile cues, Verbal cues, and Handouts Education comprehension: verbalized understanding, returned demonstration, verbal cues required, tactile cues required, and needs further education  HOME EXERCISE PROGRAM: [id number] Hesch correction for Lt post innominate  ASSESSMENT:  CLINICAL IMPRESSION: Patient is a 41 y.o. F who was seen today for physical therapy evaluation and treatment for generalized weakness and limited functional endurance with associated LBP. Pain is specific to Rt SIJ and postural adaptations are present. Able to place pt in upright posture after addressing SIJ posture and reduced pain. Pt will benefit from lumbopelvic stability training to support lumabr spine and SIJs as well as challenge of functional endurance to meet goals. Insurance allowance is limited so we discussed the opportunity to  look into [employer] so she will be able to continue with aquatic exercises.    OBJECTIVE IMPAIRMENTS: decreased activity tolerance, decreased endurance, difficulty walking, decreased strength, increased muscle spasms, improper body mechanics, postural dysfunction, and pain.   ACTIVITY LIMITATIONS: carrying, lifting, bending, standing, squatting, stairs, transfers, and locomotion level  PARTICIPATION LIMITATIONS: cleaning, laundry, shopping, and community activity  PERSONAL FACTORS: 1-2 comorbidities: obesity, multiple spinal surgeries  are also affecting patient's functional outcome.   REHAB POTENTIAL: Fair limited insurance allowance  CLINICAL DECISION MAKING: Stable/uncomplicated  EVALUATION COMPLEXITY: Low   GOALS: Goals reviewed with patient? Yes  SHORT TERM GOALS: =LTGs  LONG TERM GOALS: Target date: 03/01/22  Modified ODI to improve by MDC Baseline: MDC is 11 points Goal status: INITIAL  2.  5 times sit to stand without use of UEs Baseline: required UEs at eval Goal status: INITIAL  3.  Resolution of lumbar shift as result of pressure through [facility] in gait Baseline: present at eval Goal status: INITIAL  4.  Independent with long term functional strengthening program Baseline: to be established and progressed as appropriate Goal status: INITIAL   PLAN:  PT FREQUENCY: 1x/week  PT DURATION: 6 weeks- extended time to allow for scheduling  PLANNED INTERVENTIONS: Therapeutic exercises, Therapeutic activity, Neuromuscular re-education, Balance training, Gait training, Patient/Family education, Self Care, Joint mobilization, Stair training, Aquatic Therapy, Dry Needling, Spinal mobilization, Cryotherapy, Moist heat, Taping, Manual therapy, and Re-evaluation.  PLAN FOR NEXT SESSION: begin aquatics- check innominate rotation  Check all possible CPT codes: [phone] - PT Re-evaluation, 97110- Therapeutic Exercise, [phone]- Neuro Re-education, [phone] - Gait Training,  [phone] - Manual Therapy, [phone] - Therapeutic Activities, [phone] - Self Care, [phone] - Physical performance training, and [phone] - Aquatic therapy    Check all conditions that are expected to impact treatment: Musculoskeletal disorders   If treatment provided at initial evaluation, no treatment charged due to lack of authorization.       Terry Bolotin C. Timohty Renbarger PT, DPT 01/17/22 10:17 AM

## 2022-01-17 ENCOUNTER — Other Ambulatory Visit: Payer: Self-pay

## 2022-01-22 ENCOUNTER — Ambulatory Visit (HOSPITAL_BASED_OUTPATIENT_CLINIC_OR_DEPARTMENT_OTHER): Payer: Medicaid Other | Admitting: Physical Therapy

## 2022-01-22 ENCOUNTER — Encounter (HOSPITAL_BASED_OUTPATIENT_CLINIC_OR_DEPARTMENT_OTHER): Payer: Self-pay | Admitting: Physical Therapy

## 2022-01-22 DIAGNOSIS — M5459 Other low back pain: Secondary | ICD-10-CM | POA: Diagnosis not present

## 2022-01-22 DIAGNOSIS — M6281 Muscle weakness (generalized): Secondary | ICD-10-CM

## 2022-01-22 DIAGNOSIS — R262 Difficulty in walking, not elsewhere classified: Secondary | ICD-10-CM

## 2022-01-22 NOTE — Therapy (Signed)
OUTPATIENT PHYSICAL THERAPY THORACOLUMBAR TREATMENT   Patient Name: Ellen Booker MRN: [id number] DOB:04/03/1980, 41 y.o., female Today's Date: 01/22/2022   PT End of Session - 01/22/22 0820     Visit Number 2    Number of Visits 5    Date for PT Re-Evaluation 03/01/22    Authorization Type [place] [employer]    PT Start Time 0818    PT Stop Time 0858    PT Time Calculation (min) 40 min    Activity Tolerance Patient tolerated treatment well    Behavior During Therapy [facility] for tasks assessed/performed             Past Medical History:  Diagnosis Date   Abdominal wall mass    left side   Depression    History of Helicobacter pylori infection    per pt 2016   Renal cyst, right    per CT 04-04-2016   Wears glasses    Past Surgical History:  Procedure Laterality Date   CESAREAN SECTION  2010   COLONOSCOPY  last one 06-19-2015   EXCISION ABDOMINAL WALL MASS  2012   right side   EXCISION MASS ABDOMINAL N/A 05/21/2016   Procedure: EXCISION ABDOMINAL WALL MASS;  Surgeon: Rodman Pickle, MD;  Location: 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   Unspecified disorder of menstruation and other abnormal bleeding from female genital tract 11/08/2013   Irregular menstrual cycle 11/08/2013   UTI (lower urinary tract infection) 01/29/2013   HSV (herpes simplex virus) infection 01/26/2013   Well woman exam with routine gynecological exam 01/26/2013   BMI 40.0-44.9, adult (HCC) 01/26/2013   Elevated BP 01/26/2013     REFERRING PROVIDER: Jonetta Speak, NP  REFERRING DIAG:  Z98.1 (ICD-10-CM) - Arthrodesis status   M48.061 (ICD-10-CM) - Spinal stenosis, lumbar region without neurogenic claudication    Rationale for Evaluation and Treatment Rehabilitation  THERAPY DIAG:  Other low back pain  Muscle weakness (generalized)  Difficulty in walking, not elsewhere classified  ONSET DATE: approx 03/25/2020  SUBJECTIVE:                                                                                                                                                                                           SUBJECTIVE STATEMENT: Pt  reports she has been doing the HEP per day.  "It hurts, but I have been doing it".   PERTINENT HISTORY:  Multiple spinal surgeries, obesity  PAIN:  Are you having pain? Yes: NPRS scale: 4/10 Pain location: Rt SIJ Pain description: sharp Aggravating factors: standing Relieving factors: rest  PRECAUTIONS: None  WEIGHT BEARING RESTRICTIONS: No  FALLS:  Has patient fallen in last 6 months? No   PLOF: Independent  PATIENT GOALS: improve strength via aquatics   OBJECTIVE:   DIAGNOSTIC FINDINGS:  Lumbar MRI 09/30/20: IMPRESSION: Status post L5-S1 decompression. A large right paracentral and subarticular recess disc protrusion impinges on the right S1 root and deforms the right side of the thecal sac. There is also moderate right foraminal narrowing at this level.   Mild central canal and bilateral foraminal narrowing L4-5.  PATIENT SURVEYS:  Modified ODI 20  COGNITION: Overall cognitive status: Within functional limits for tasks assessed     SENSATION: WFL   POSTURE: Lt post innom with Lt upper trunk shift Bil knee hyperext in standing- hanging on the ligaments posture  PALPATION: TTP Rt SIJ  LUMBAR ROM:   AROM eval  Flexion   Extension   Right lateral flexion   Left lateral flexion   Right rotation   Left rotation    (Blank rows = not tested)  LOWER EXTREMITY MMT:    At eval able to demo lifting bil LEs against gravity  FUNCTIONAL  TESTS:  5 times sit to stand: required UEs  GAIT: Pt ambulated to & from the pool, without rest break, using SPC that we discussed reducing pressure on as it is encouraging lumbar shift pt reported significant fatigue with this walk that was approx 400 ft  TODAY'S TREATMENT:                         Treatment        01/22/22:  Pt seen for aquatic therapy today.  Treatment took place in water 3.25-4.75 ft in depth at the [facility] pool. Temp of water was 92.  Pt entered/exited the pool via stairs independently with bilat rail.  * intro to [employer] white barbell: forward / backwards; side stepping with increased step height; high knee marching forward;  * holding wall: heel raises x 10; hip abdct x 10; hip ext x 10; squats x 10; hip openers x 10; single clam x 10  * return to walking/ marching * straddling yellow noodle:  cycling, cc ski, jumping jack legs (LE suspended)     Pt requires the buoyancy and hydrostatic pressure of water for support, and to offload joints by unweighting joint load by at least 50 % in navel deep water and by at least 75-80% in chest to neck deep water.  Viscosity of the water is needed for resistance of strengthening. Water current perturbations provides challenge to standing balance requiring increased core activation.   Treatment        eval 01/16/22:  Hesch self correction for Lt post innom rotation Posture/ab set in seated and standing Standing glut set Bridge mini swiss ball, sit to stand and hip hinge with mini swiss ball     PATIENT EDUCATION:  Education details: Teacher, music of condition, POC, HEP, exercise form/rationale Person educated: Patient Education method: Explanation, Demonstration, Tactile cues, Verbal cues, and Handouts Education comprehension: verbalized understanding, returned demonstration, verbal cues required, tactile cues required, and needs further education  HOME EXERCISE PROGRAM: [id number] Hesch  correction for  Lt post innominate  ASSESSMENT:  CLINICAL IMPRESSION: Patient is confident in aquatic environment; able to take direction from therapist on deck.  She was more at ease walking holding onto white barbell.  Requires minor cues to allow Rt knee/hip to flex and move. Pt reported reduction of pain to 2/10 during session. we discussed the opportunity to look into drop in rates at local pools so she will be able to continue with aquatic exercises.  Goals are ongoing. Plan to build her aquatic HEP next visit.   OBJECTIVE IMPAIRMENTS: decreased activity tolerance, decreased endurance, difficulty walking, decreased strength, increased muscle spasms, improper body mechanics, postural dysfunction, and pain.   ACTIVITY LIMITATIONS: carrying, lifting, bending, standing, squatting, stairs, transfers, and locomotion level  PARTICIPATION LIMITATIONS: cleaning, laundry, shopping, and community activity  PERSONAL FACTORS: 1-2 comorbidities: obesity, multiple spinal surgeries  are also affecting patient's functional outcome.   REHAB POTENTIAL: Fair limited insurance allowance  CLINICAL DECISION MAKING: Stable/uncomplicated  EVALUATION COMPLEXITY: Low   GOALS: Goals reviewed with patient? Yes  SHORT TERM GOALS: =LTGs  LONG TERM GOALS: Target date: 03/01/22  Modified ODI to improve by MDC Baseline: MDC is 11 points Goal status: INITIAL  2.  5 times sit to stand without use of UEs Baseline: required UEs at eval Goal status: INITIAL  3.  Resolution of lumbar shift as result of pressure through [facility] in gait Baseline: present at eval Goal status: INITIAL  4.  Independent with long term functional strengthening program Baseline: to be established and progressed as appropriate Goal status: INITIAL   PLAN:  PT FREQUENCY: 1x/week  PT DURATION: 6 weeks- extended time to allow for scheduling  PLANNED INTERVENTIONS: Therapeutic exercises, Therapeutic activity, Neuromuscular re-education, Balance  training, Gait training, Patient/Family education, Self Care, Joint mobilization, Stair training, Aquatic Therapy, Dry Needling, Spinal mobilization, Cryotherapy, Moist heat, Taping, Manual therapy, and Re-evaluation.  PLAN FOR NEXT SESSION: begin aquatics- check innominate rotation  Check all possible CPT codes: [phone] - PT Re-evaluation, 97110- Therapeutic Exercise, [phone]- Neuro Re-education, [phone] - Gait Training, [phone] - Manual Therapy, [phone] - Therapeutic Activities, [phone] - Self Care, [phone] - Physical performance training, and [phone] - Aquatic therapy    Check all conditions that are expected to impact treatment: Musculoskeletal disorders   If treatment provided at initial evaluation, no treatment charged due to lack of authorization.     Kerin Perna, PTA 01/22/22 9:00 AM [facility] [address] [place], [place], [address] Phone: [phone]   Fax:  [phone]

## 2022-01-29 ENCOUNTER — Ambulatory Visit (HOSPITAL_BASED_OUTPATIENT_CLINIC_OR_DEPARTMENT_OTHER): Payer: Medicaid Other | Attending: Nurse Practitioner | Admitting: Physical Therapy

## 2022-01-29 ENCOUNTER — Encounter (HOSPITAL_BASED_OUTPATIENT_CLINIC_OR_DEPARTMENT_OTHER): Payer: Self-pay | Admitting: Physical Therapy

## 2022-01-29 DIAGNOSIS — R262 Difficulty in walking, not elsewhere classified: Secondary | ICD-10-CM | POA: Diagnosis present

## 2022-01-29 DIAGNOSIS — M5459 Other low back pain: Secondary | ICD-10-CM

## 2022-01-29 DIAGNOSIS — M6281 Muscle weakness (generalized): Secondary | ICD-10-CM

## 2022-01-29 NOTE — Therapy (Signed)
OUTPATIENT PHYSICAL THERAPY THORACOLUMBAR TREATMENT   Patient Name: Ellen Booker MRN: [id number] DOB:12-12-1980, 41 y.o., female Today's Date: 01/29/2022   PT End of Session - 01/29/22 1118     Visit Number 3    Number of Visits 5    Date for PT Re-Evaluation 03/01/22    Authorization Type [place] [employer]    PT Start Time 1118    PT Stop Time 1205    PT Time Calculation (min) 47 min    Activity Tolerance Patient tolerated treatment well    Behavior During Therapy [facility] for tasks assessed/performed             Past Medical History:  Diagnosis Date   Abdominal wall mass    left side   Depression    History of Helicobacter pylori infection    per pt 2016   Renal cyst, right    per CT 04-04-2016   Wears glasses    Past Surgical History:  Procedure Laterality Date   CESAREAN SECTION  2010   COLONOSCOPY  last one 06-19-2015   EXCISION ABDOMINAL WALL MASS  2012   right side   EXCISION MASS ABDOMINAL N/A 05/21/2016   Procedure: EXCISION ABDOMINAL WALL MASS;  Surgeon: Mickeal Skinner, MD;  Location: [facility];  Service: General;  Laterality: N/A;   RESECTION OF ABDOMINAL MASS     Patient Active Problem List   Diagnosis Date Noted   Dysmenorrhea 02/01/2014   Tobacco dependency 02/01/2014   Obesity 12/09/2013   Essential hypertension, benign 12/09/2013   Candidiasis of vulva and vagina 12/08/2013   Bilateral nipple discharge 11/08/2013   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   Unspecified disorder of menstruation and other abnormal bleeding from female genital tract 11/08/2013   Irregular menstrual cycle 11/08/2013   UTI (lower urinary tract infection) 01/29/2013   HSV (herpes simplex virus) infection 01/26/2013   Well woman exam with routine gynecological exam 01/26/2013   BMI 40.0-44.9, adult ([place]) 01/26/2013   Elevated BP 01/26/2013     REFERRING PROVIDER: Lorane Gell, NP  REFERRING DIAG:  Z98.1 (ICD-10-CM) - Arthrodesis status   M48.061 (ICD-10-CM) - Spinal stenosis, lumbar region without neurogenic claudication    Rationale for Evaluation and Treatment Rehabilitation  THERAPY DIAG:  Other low back pain  Muscle weakness (generalized)  Difficulty in walking, not elsewhere classified  ONSET DATE: approx 03/25/2020  SUBJECTIVE:                                                                                                                                                                                           SUBJECTIVE STATEMENT: Pt  reports she has been doing the HEP 2x /day. "I'm feeling hopeful that if I get aligned, I will have less pain".   The exercises continue to be painful (R hip flexor stretch, standing glute sets, sit to stand squeezing pillow, 3 way leg kick; hip flexor isomet x 5s x 10 reps.) She continues to have R hip pain with walking.    PERTINENT HISTORY:  Multiple spinal surgeries, obesity  PAIN:  Are you having pain? Yes: NPRS scale: 4/10 Pain location: Rt SIJ Pain description: sharp Aggravating factors: standing Relieving factors: rest  PRECAUTIONS: None  WEIGHT BEARING RESTRICTIONS: No  FALLS:  Has patient fallen in last 6 months? No   PLOF: Independent  PATIENT GOALS: improve strength via aquatics   OBJECTIVE:   DIAGNOSTIC FINDINGS:  Lumbar MRI 09/30/20: IMPRESSION: Status post L5-S1 decompression. A large right paracentral and subarticular recess disc protrusion impinges on the right S1 root and deforms the right side of the thecal sac. There is also moderate right foraminal narrowing at this level.   Mild central canal and bilateral foraminal narrowing L4-5.  PATIENT SURVEYS:  Modified ODI 20  COGNITION: Overall cognitive status: Within functional limits for tasks assessed     SENSATION: WFL   POSTURE: eval: Lt post innom with Lt upper trunk shift Bil knee hyperext in standing- hanging on the ligaments posture  PALPATION: TTP Rt SIJ  01/29/22:  in  supine - ASIS appear =, Leg length even;  Lt ASIS more prominent than Rt.  TTP in Rt QL, Rt/Lt iliacus  LUMBAR ROM:   AROM eval  Flexion   Extension   Right lateral flexion   Left lateral flexion   Right rotation   Left rotation    (Blank rows = not tested)  LOWER EXTREMITY MMT:    At eval able to demo lifting bil LEs against gravity  FUNCTIONAL TESTS:  5 times sit to stand: required UEs  GAIT: Pt ambulated to & from the pool, without rest break, using SPC that we discussed reducing pressure on as it is encouraging lumbar shift pt reported significant fatigue with this walk that was approx 400 ft  TODAY'S TREATMENT:                         Treatment        01/22/22: Prior to entering water:  palpation to pelvic to ck alignment.   Therap Ex:  supine hip flex isometric x 3sec each;  bridge with towel squeeze x 5; mini clam x 5 each; STS x 2; mini squat x 3 holding wall.   Pt seen for aquatic therapy today.  Treatment took place in water 3.25-4.75 ft in depth at the [facility]. Temp of water was 92.  Pt entered/exited the pool via stairs independently with bilat rail.  * without support:  walking forward/ backwards;  side stepping 3 laps of each * holding wall: heel raises x 10; hip abdct x 10; squats x 10; ; single clam x 10 ; side to side lunges for adductor  stretch * return to walking forward/backward and side stepping * straddling yellow noodle:  cycling, cc ski, jumping jack legs (LE suspended)  * return to walking forward/ backward, marching     Pt requires the buoyancy and hydrostatic pressure of water for support, and to offload joints by unweighting joint load by at least 50 % in navel deep water and by at least 75-80% in chest to  neck deep water.  Viscosity of the water is needed for resistance of strengthening. Water current perturbations provides challenge to standing balance requiring increased core activation.   Treatment        eval  01/16/22:  Hesch self correction for Lt post innom rotation Posture/ab set in seated and standing Standing glut set Bridge mini swiss ball, sit to stand and hip hinge with mini swiss ball     PATIENT EDUCATION:  Education details: HEP, exercise form/rationale Person educated: Patient Education method: Explanation, Demonstration, Tactile cues, Verbal cues, and Handouts Education comprehension: verbalized understanding, returned demonstration, verbal cues required, tactile cues required, and needs further education  HOME EXERCISE PROGRAM: [email or web address] - updated 01/29/22 Hesch correction for Lt post innominate  ASSESSMENT:  CLINICAL IMPRESSION: Pt able to walk confidently without holding onto floatation device.  She reported increase in pain in R SI/lower back with single leg clams and side to side lunge; pain reduced with return to walking.  Pt reported reduction of pain to 0/10 when cycling with LE suspended, and 2-3/10 with exercise during session. Encouraged pt again to look into drop in rates at local pools so she will be able to continue with aquatic exercises.  Modified HEP - took out standing hip flexor isometrics and R Thomas hip flexor stretch.  Changed STS to mini squat at counter.  Goals are ongoing.   OBJECTIVE IMPAIRMENTS: decreased activity tolerance, decreased endurance, difficulty walking, decreased strength, increased muscle spasms, improper body mechanics, postural dysfunction, and pain.   ACTIVITY LIMITATIONS: carrying, lifting, bending, standing, squatting, stairs, transfers, and locomotion level  PARTICIPATION LIMITATIONS: cleaning, laundry, shopping, and community activity  PERSONAL FACTORS: 1-2 comorbidities: obesity, multiple spinal surgeries  are also affecting patient's functional outcome.   REHAB POTENTIAL: Fair limited insurance allowance  CLINICAL DECISION MAKING: Stable/uncomplicated  EVALUATION COMPLEXITY: Low   GOALS: Goals reviewed with patient?  Yes  SHORT TERM GOALS: =LTGs  LONG TERM GOALS: Target date: 03/01/22  Modified ODI to improve by MDC Baseline: MDC is 11 points Goal status: Ongoing  2.  5 times sit to stand without use of UEs Baseline: required UEs at eval Goal status: Ongoing  3.  Resolution of lumbar shift as result of pressure through [facility] in gait Baseline: present at eval Goal status: Ongoing   4.  Independent with long term functional strengthening program Baseline: to be established and progressed as appropriate Goal status: Ongoing   PLAN:  PT FREQUENCY: 1x/week  PT DURATION: 6 weeks- extended time to allow for scheduling  PLANNED INTERVENTIONS: Therapeutic exercises, Therapeutic activity, Neuromuscular re-education, Balance training, Gait training, Patient/Family education, Self Care, Joint mobilization, Stair training, Aquatic Therapy, Dry Needling, Spinal mobilization, Cryotherapy, Moist heat, Taping, Manual therapy, and Re-evaluation.  PLAN FOR NEXT SESSION: cont aquatics- check innominate rotation  Kerin Perna, PTA 01/29/22 12:40 PM [facility] [place], [place], [address] Phone: [phone]   Fax:  [phone]   Check all possible CPT codes: [phone] - PT Re-evaluation, 97110- Therapeutic Exercise, [phone]- Neuro Re-education, [phone] - Gait Training, [phone] - Manual Therapy, [phone] - Therapeutic Activities, [phone] - Self Care, [phone] - Physical performance training, and [phone] - Aquatic therapy    Check all conditions that are expected to impact treatment: Musculoskeletal disorders   If treatment provided at initial evaluation, no treatment charged due to lack of authorization.

## 2022-02-04 ENCOUNTER — Encounter (HOSPITAL_BASED_OUTPATIENT_CLINIC_OR_DEPARTMENT_OTHER): Payer: Self-pay | Admitting: Physical Therapy

## 2022-02-04 ENCOUNTER — Ambulatory Visit (HOSPITAL_BASED_OUTPATIENT_CLINIC_OR_DEPARTMENT_OTHER): Payer: Medicaid Other | Admitting: Physical Therapy

## 2022-02-04 DIAGNOSIS — R262 Difficulty in walking, not elsewhere classified: Secondary | ICD-10-CM

## 2022-02-04 DIAGNOSIS — M5459 Other low back pain: Secondary | ICD-10-CM

## 2022-02-04 DIAGNOSIS — M6281 Muscle weakness (generalized): Secondary | ICD-10-CM

## 2022-02-04 NOTE — Therapy (Signed)
OUTPATIENT PHYSICAL THERAPY THORACOLUMBAR TREATMENT   Patient Name: Ellen Booker MRN: [id number] DOB:[birth year], 41 y.o., female Today's Date: 02/04/2022   PT End of Session - 02/04/22 0904     Visit Number 4    Number of Visits 5    Date for PT Re-Evaluation 03/01/22    Authorization Type [place] [employer]    PT Start Time 0903    PT Stop Time 0945    PT Time Calculation (min) 42 min    Activity Tolerance Patient tolerated treatment well    Behavior During Therapy [facility] for tasks assessed/performed             Past Medical History:  Diagnosis Date   Abdominal wall mass    left side   Depression    History of Helicobacter pylori infection    per pt 2016   Renal cyst, right    per CT 04-04-2016   Wears glasses    Past Surgical History:  Procedure Laterality Date   CESAREAN SECTION  2010   COLONOSCOPY  last one 06-19-2015   EXCISION ABDOMINAL WALL MASS  2012   right side   EXCISION MASS ABDOMINAL N/A 05/21/2016   Procedure: EXCISION ABDOMINAL WALL MASS;  Surgeon: Rodman Pickle, MD;  Location: [facility] [place];  Service: General;  Laterality: N/A;   RESECTION OF ABDOMINAL MASS     Patient Active Problem List   Diagnosis Date Noted   Dysmenorrhea 02/01/2014   Tobacco dependency 02/01/2014   Obesity 12/09/2013   Essential hypertension, benign 12/09/2013   Candidiasis of vulva and vagina 12/08/2013   Bilateral nipple discharge 11/08/2013   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   Unspecified disorder of menstruation and other abnormal bleeding from female genital tract 11/08/2013   Irregular menstrual cycle 11/08/2013   UTI (lower urinary tract infection) 01/29/2013   HSV (herpes simplex virus) infection 01/26/2013   Well woman exam with routine gynecological exam 01/26/2013   BMI 40.0-44.9, adult ([facility]) 01/26/2013   Elevated BP 01/26/2013     REFERRING PROVIDER: Jonetta Speak, NP  REFERRING DIAG:  Z98.1 (ICD-10-CM) - Arthrodesis status   M48.061 (ICD-10-CM) - Spinal stenosis, lumbar region without neurogenic claudication    Rationale for Evaluation and Treatment Rehabilitation  THERAPY DIAG:  Other low back pain  Muscle weakness (generalized)  Difficulty in walking, not elsewhere classified  ONSET DATE: approx 03/25/2020  SUBJECTIVE:                                                                                                                                                                                           SUBJECTIVE STATEMENT: "My  pain remains a constant 4"   Pt reports she received a steroid and tramadol injection after last visit with 2 days of relief.    She forgot cane on trip to [place] and had difficulty walking the distance from car to store without it; "legs felt shaky".   PERTINENT HISTORY:  Multiple spinal surgeries, obesity  PAIN:  Are you having pain? Yes: NPRS scale: 4/10 Pain location: Rt SIJ Pain description: sharp Aggravating factors: standing Relieving factors: rest  PRECAUTIONS: None  WEIGHT BEARING RESTRICTIONS: No  FALLS:  Has patient fallen in last 6 months? No   PLOF: Independent  PATIENT GOALS: improve strength via aquatics   OBJECTIVE:   DIAGNOSTIC FINDINGS:  Lumbar MRI 09/30/20: IMPRESSION: Status post L5-S1 decompression. A large right paracentral and subarticular recess disc protrusion impinges on the right S1 root and deforms the right side of the thecal sac. There is also moderate right foraminal narrowing at this level.   Mild central canal and bilateral foraminal narrowing L4-5.  PATIENT SURVEYS:  Modified ODI 20  COGNITION: Overall cognitive status: Within functional limits for tasks assessed     SENSATION: WFL   POSTURE: eval: Lt post innom with Lt upper trunk shift Bil knee hyperext in standing- hanging on the ligaments posture  PALPATION: TTP Rt SIJ  01/29/22:  in supine - ASIS appear =, Leg length even;  Lt ASIS more prominent than Rt.   TTP in Rt QL, Rt/Lt iliacus  LUMBAR ROM:   AROM eval  Flexion   Extension   Right lateral flexion   Left lateral flexion   Right rotation   Left rotation    (Blank rows = not tested)  LOWER EXTREMITY MMT:    At eval able to demo lifting bil LEs against gravity  FUNCTIONAL TESTS:  5 times sit to stand: required UEs  GAIT: Pt ambulated to & from the pool, without rest break, using SPC that we discussed reducing pressure on as it is encouraging lumbar shift pt reported significant fatigue with this walk that was approx 400 ft  TODAY'S TREATMENT:                         Treatment        02/04/22:  Pt seen for aquatic therapy today.  Treatment took place in water 3.25-4.75 ft in depth at the [facility] pool. Temp of water was 92.  Pt entered/exited the pool via stairs independently with bilat rail.  * with support of yellow noodle: walking forward/ backwards;  side stepping 3 laps of each;  high knee marching backwards; forward walking kicks * holding white barbell: heel raises x 10; hip abdct x 5  x 2 * holding wall:  hip ext x 5, hip flexion (SLR) x 5 each LE - painful to swing RLE * return to walking without support - forward/ backward  * holding wall: ; squats x 10; ; single clam x 10 ; side to side lunges for adductor stretch x 5 (mild pain in R SI jt) * mini squats with hip hinge * straddling yellow noodle:  cycling with breast stroke arms   * once dried off:  reg Rock tape applied in X pattern at R SI joint with 25% stretch, pt sitting upright - to increase proprioception and assist with scar mobility.   Pt requires the buoyancy and hydrostatic pressure of water for support, and to offload joints by unweighting joint load by at least 50 %  in navel deep water and by at least 75-80% in chest to neck deep water.  Viscosity of the water is needed for resistance of strengthening. Water current perturbations provides challenge to standing balance requiring increased core  activation.   Treatment        eval 01/16/22:  Hesch self correction for Lt post innom rotation Posture/ab set in seated and standing Standing glut set Bridge mini swiss ball, sit to stand and hip hinge with mini swiss ball     PATIENT EDUCATION:  Education details: HEP, exercise form/rationale Person educated: Patient Education method: Explanation, Demonstration, Tactile cues, Verbal cues, and Handouts Education comprehension: verbalized understanding, returned demonstration, verbal cues required, tactile cues required, and needs further education  HOME EXERCISE PROGRAM: [email or web address] - updated 01/29/22 Hesch correction for Lt post innominate  ASSESSMENT:  CLINICAL IMPRESSION: She reported increase in pain in R SI/lower back with R hip flex/ext and side to side lunge; pain reduced with return to walking.  Pt reported reduction of pain to 0/10 when cycling with LE suspended. She reports good relief while exercising in the water, however she has limited access to pool due to finances.  Trial of Rock tape applied to area of pain; explained to pt safe removal technique and application guidelines with verbalized understanding.  Pt is making gradual progress towards goals.  OBJECTIVE IMPAIRMENTS: decreased activity tolerance, decreased endurance, difficulty walking, decreased strength, increased muscle spasms, improper body mechanics, postural dysfunction, and pain.   ACTIVITY LIMITATIONS: carrying, lifting, bending, standing, squatting, stairs, transfers, and locomotion level  PARTICIPATION LIMITATIONS: cleaning, laundry, shopping, and community activity  PERSONAL FACTORS: 1-2 comorbidities: obesity, multiple spinal surgeries  are also affecting patient's functional outcome.   REHAB POTENTIAL: Fair limited insurance allowance  CLINICAL DECISION MAKING: Stable/uncomplicated  EVALUATION COMPLEXITY: Low   GOALS: Goals reviewed with patient? Yes  SHORT TERM GOALS: =LTGs  LONG TERM  GOALS: Target date: 03/01/22  Modified ODI to improve by MDC Baseline: MDC is 11 points Goal status: Ongoing  2.  5 times sit to stand without use of UEs Baseline: required UEs at eval Goal status: Ongoing  3.  Resolution of lumbar shift as result of pressure through [facility] in gait Baseline: present at eval Goal status: Ongoing   4.  Independent with long term functional strengthening program Baseline: to be established and progressed as appropriate Goal status: Ongoing   PLAN:  PT FREQUENCY: 1x/week  PT DURATION: 6 weeks- extended time to allow for scheduling  PLANNED INTERVENTIONS: Therapeutic exercises, Therapeutic activity, Neuromuscular re-education, Balance training, Gait training, Patient/Family education, Self Care, Joint mobilization, Stair training, Aquatic Therapy, Dry Needling, Spinal mobilization, Cryotherapy, Moist heat, Taping, Manual therapy, and Re-evaluation.  PLAN FOR NEXT SESSION: Land reassessment for possible recert;  DN?/ manual therapy;  check innominate rotation  Check all possible CPT codes: 88828 - PT Re-evaluation, 97110- Therapeutic Exercise, [phone]- Neuro Re-education, [phone] - Gait Training, [phone] - Manual Therapy, [phone] - Therapeutic Activities, [phone] - Self Care, [phone] - Physical performance training, and [phone] - Aquatic therapy    Check all conditions that are expected to impact treatment: Musculoskeletal disorders   Mayer Camel, PTA 02/04/22 10:48 AM [facility] [address] [place], [place], [address] Phone: [phone]   Fax:  [phone]

## 2022-02-12 ENCOUNTER — Ambulatory Visit (HOSPITAL_BASED_OUTPATIENT_CLINIC_OR_DEPARTMENT_OTHER): Payer: Medicaid Other | Admitting: Physical Therapy

## 2022-02-15 ENCOUNTER — Encounter (HOSPITAL_BASED_OUTPATIENT_CLINIC_OR_DEPARTMENT_OTHER): Payer: Self-pay | Admitting: Physical Therapy

## 2022-02-18 ENCOUNTER — Ambulatory Visit (HOSPITAL_BASED_OUTPATIENT_CLINIC_OR_DEPARTMENT_OTHER): Payer: Medicaid Other | Admitting: Physical Therapy

## 2022-02-19 ENCOUNTER — Ambulatory Visit (HOSPITAL_BASED_OUTPATIENT_CLINIC_OR_DEPARTMENT_OTHER): Payer: Medicaid Other | Admitting: Physical Therapy

## 2022-02-19 ENCOUNTER — Encounter (HOSPITAL_BASED_OUTPATIENT_CLINIC_OR_DEPARTMENT_OTHER): Payer: Self-pay | Admitting: Physical Therapy

## 2022-02-19 DIAGNOSIS — M6281 Muscle weakness (generalized): Secondary | ICD-10-CM

## 2022-02-19 DIAGNOSIS — M5459 Other low back pain: Secondary | ICD-10-CM | POA: Diagnosis not present

## 2022-02-19 NOTE — Therapy (Addendum)
OUTPATIENT PHYSICAL THERAPY THORACOLUMBAR TREATMENT/discharge    Patient Name: Ellen Booker MRN: [id number] DOB:1980/04/06, 41 y.o., female Today's Date: 02/04/2022   PT End of Session - 02/04/22 0904     Visit Number 4    Number of Visits 5    Date for PT Re-Evaluation 03/01/22    Authorization Type [place] [employer]    PT Start Time 0903    PT Stop Time 0945    PT Time Calculation (min) 42 min    Activity Tolerance Patient tolerated treatment well    Behavior During Therapy [facility] for tasks assessed/performed             Past Medical History:  Diagnosis Date   Abdominal wall mass    left side   Depression    History of Helicobacter pylori infection    per pt 2016   Renal cyst, right    per CT 04-04-2016   Wears glasses    Past Surgical History:  Procedure Laterality Date   CESAREAN SECTION  2010   COLONOSCOPY  last one 06-19-2015   EXCISION ABDOMINAL WALL MASS  2012   right side   EXCISION MASS ABDOMINAL N/A 05/21/2016   Procedure: EXCISION ABDOMINAL WALL MASS;  Surgeon: Mickeal Skinner, MD;  Location: [place];  Service: General;  Laterality: N/A;   RESECTION OF ABDOMINAL MASS     Patient Active Problem List   Diagnosis Date Noted   Dysmenorrhea 02/01/2014   Tobacco dependency 02/01/2014   Obesity 12/09/2013   Essential hypertension, benign 12/09/2013   Candidiasis of vulva and vagina 12/08/2013   Bilateral nipple discharge 11/08/2013   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   Unspecified disorder of menstruation and other abnormal bleeding from female genital tract 11/08/2013   Irregular menstrual cycle 11/08/2013   UTI (lower urinary tract infection) 01/29/2013   HSV (herpes simplex virus) infection 01/26/2013   Well woman exam with routine gynecological exam 01/26/2013   BMI 40.0-44.9, adult ([place]) 01/26/2013   Elevated BP 01/26/2013     REFERRING PROVIDER: Lorane Gell, NP  REFERRING DIAG:  Z98.1 (ICD-10-CM) - Arthrodesis  status  M48.061 (ICD-10-CM) - Spinal stenosis, lumbar region without neurogenic claudication    Rationale for Evaluation and Treatment Rehabilitation  THERAPY DIAG:  Other low back pain  Muscle weakness (generalized)  Difficulty in walking, not elsewhere classified  ONSET DATE: approx 03/25/2020  SUBJECTIVE:                                                                                                                                                                                           SUBJECTIVE STATEMENT:  The patient reports the pool has helped.  She feels less shaky when she is in the pool.  Unfortunately she is out of visits this year.  [facility].  Will go for through 1 more visit of land things that we will hopefully she can work on at home. PERTINENT HISTORY:  Multiple spinal surgeries, obesity  PAIN:  Are you having pain? Yes: NPRS scale: 4/10 Pain location: Rt SIJ Pain description: sharp Aggravating factors: standing Relieving factors: rest  PRECAUTIONS: None  WEIGHT BEARING RESTRICTIONS: No  FALLS:  Has patient fallen in last 6 months? No   PLOF: Independent  PATIENT GOALS: improve strength via aquatics   OBJECTIVE:   DIAGNOSTIC FINDINGS:  Lumbar MRI 09/30/20: IMPRESSION: Status post L5-S1 decompression. A large right paracentral and subarticular recess disc protrusion impinges on the right S1 root and deforms the right side of the thecal sac. There is also moderate right foraminal narrowing at this level.   Mild central canal and bilateral foraminal narrowing L4-5.  PATIENT SURVEYS:  Modified ODI 20  COGNITION: Overall cognitive status: Within functional limits for tasks assessed     SENSATION: WFL   POSTURE: eval: Lt post innom with Lt upper trunk shift Bil knee hyperext in standing- hanging on the ligaments posture  PALPATION: TTP Rt SIJ  01/29/22:  in supine - ASIS appear =, Leg length even;  Lt ASIS  more prominent than Rt.  TTP in Rt QL, Rt/Lt iliacus  LUMBAR ROM:   AROM 11/29  Flexion 20 degrees with significant pain  Extension Pain going past neutral  Right lateral flexion   Left lateral flexion   Right rotation Significant pain  Left rotation Minor pain on the right   (Blank rows = not tested)  LOWER EXTREMITY MMT:    At eval able to demo lifting bil LEs against gravity  FUNCTIONAL TESTS:  5 times sit to stand: required UEs  GAIT: Cautious movements with ambulation.  Decreased right single-leg stance time.  TODAY'S TREATMENT:                             11/29 Manual therapy: Reviewed the use of Thera cane for self trigger point release to the lumbar spine and gluteals.  Patient's muscles are hypersensitive to light touch.  Spent significant time educating patient on importance of desensitization and hurt versus harm concepts.  Reviewed where to buy Thera cane.  Also, reviewed how to use tennis ball.  Reviewed decompression position with exercise ball.  Patient reports he plans on getting exercise ball. Ball press with abdominal breathing 2 x 10 Reviewed heel press from decompression position with a ball  Reviewed ball stretch at table to improve lumbar flexion.  Patient's lumbar flexion is significantly limited and she demonstrates fear avoidance behavior with lumbar flexion and rotation.  Significant time spent educating the patient on graded exposure to lumbar flexion and lumbar rotation. Lateral trunk rotation x 15 in pain-free ranges  On previous visit right anterior pelvic shift noted.  Patient given self met with instruction on keeping pain low.  Anterior self met with cane for right and inominent.      Treatment        02/04/22:  Pt seen for aquatic therapy today.  Treatment took place in water 3.25-4.75 ft in depth at the [facility]. Temp of water was 92.  Pt entered/exited the pool via stairs  independently with bilat rail.  * with  support of yellow noodle: walking forward/ backwards;  side stepping 3 laps of each;  high knee marching backwards; forward walking kicks * holding white barbell: heel raises x 10; hip abdct x 5  x 2 * holding wall:  hip ext x 5, hip flexion (SLR) x 5 each LE - painful to swing RLE * return to walking without support - forward/ backward  * holding wall: ; squats x 10; ; single clam x 10 ; side to side lunges for adductor stretch x 5 (mild pain in R SI jt) * mini squats with hip hinge * straddling yellow noodle:  cycling with breast stroke arms   * once dried off:  reg Rock tape applied in X pattern at R SI joint with 25% stretch, pt sitting upright - to increase proprioception and assist with scar mobility.   Pt requires the buoyancy and hydrostatic pressure of water for support, and to offload joints by unweighting joint load by at least 50 % in navel deep water and by at least 75-80% in chest to neck deep water.  Viscosity of the water is needed for resistance of strengthening. Water current perturbations provides challenge to standing balance requiring increased core activation.   Treatment        eval 01/16/22:  Hesch self correction for Lt post innom rotation Posture/ab set in seated and standing Standing glut set Bridge mini swiss ball, sit to stand and hip hinge with mini swiss ball     PATIENT EDUCATION:  Education details: HEP, exercise form/rationale Person educated: Patient Education method: Explanation, Demonstration, Tactile cues, Verbal cues, and Handouts Education comprehension: verbalized understanding, returned demonstration, verbal cues required, tactile cues required, and needs further education  HOME EXERCISE PROGRAM: [id number] - updated 01/29/22 Hesch correction for Lt post innominate  ASSESSMENT:  CLINICAL IMPRESSION: Upon reassessment it was found that she has significant limitations in many movements that she needs to be able to do during the day.  She has  significant sensitivity to light touch in her lumbar spine.  Her lumbar flexion is limited to less than 15 degrees of bend.  When she does bend her legs began to shake.  We reviewed exercises that she can perform below the pain threshold for desensitization of the central nervous system.  She was also shown to decompression position for days when her pain is bad she was advised to get her decompression position and perform rhythmic breathing in this position.  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  She was shown a self correction for this.  Her insurance will reset in 4 weeks.  At this time we will reassess her.  This will give her good time to work on desensitization activities for her lumbar spine.  She was shown 2 forms of trigger point release.  She was advised to use more oral as a desensitization technique.  OBJECTIVE IMPAIRMENTS: decreased activity tolerance, decreased endurance, difficulty walking, decreased strength, increased muscle spasms, improper body mechanics, postural dysfunction, and pain.   ACTIVITY LIMITATIONS: carrying, lifting, bending, standing, squatting, stairs, transfers, and locomotion level  PARTICIPATION LIMITATIONS: cleaning, laundry, shopping, and community activity  PERSONAL FACTORS: 1-2 comorbidities: obesity, multiple spinal surgeries  are also affecting patient's functional outcome.   REHAB POTENTIAL: Fair limited insurance allowance  CLINICAL DECISION MAKING: Stable/uncomplicated  EVALUATION COMPLEXITY: Low   GOALS: Goals reviewed with patient? Yes  SHORT TERM GOALS: =LTGs  LONG TERM GOALS: Target date: 03/01/22  Modified ODI to improve by MDC Baseline: MDC is 11 points Goal status: Ongoing  2.  5 times sit to stand without use of UEs Baseline: required UEs at eval Goal status: Ongoing  3.  Resolution of lumbar shift as result of pressure through [facility] in gait Baseline: present at eval Goal  status: Ongoing   4.  Independent with long term functional strengthening program Baseline: to be established and progressed as appropriate Goal status: Working on land program but limited by limited PT visits   PLAN:  PT FREQUENCY: 1x/week  PT DURATION: 6 weeks- extended time to allow for scheduling  PLANNED INTERVENTIONS: Therapeutic exercises, Therapeutic activity, Neuromuscular re-education, Balance training, Gait training, Patient/Family education, Self Care, Joint mobilization, Stair training, Aquatic Therapy, Dry Needling, Spinal mobilization, Cryotherapy, Moist heat, Taping, Manual therapy, and Re-evaluation.  PLAN FOR NEXT SESSION: Land reassessment for possible recert;  DN?/ manual therapy;  check innominate rotation  Check all possible CPT codes: [phone] - PT Re-evaluation, 97110- Therapeutic Exercise, [phone]- Neuro Re-education, [phone] - Gait Training, [phone] - Manual Therapy, [phone] - Therapeutic Activities, [phone] - Self Care, [phone] - Physical performance training, and [phone] - Aquatic therapy    Check all conditions that are expected to impact treatment: Musculoskeletal disorders   Discharge performed by Baker Pierini on 1/31   PHYSICAL THERAPY DISCHARGE SUMMARY  Visits from Start of Care: 4  Current functional level related to goals / functional outcomes: Mild improvement with pool.    Remaining deficits: Needs land program but out of visits    Education / Equipment: HEP   Patient agrees to discharge. Patient goals were not met. Patient is being discharged due to  out of insurance .  Kerin Perna, PTA 02/04/22 10:48 AM [facility] [address] [place], [place], [address] Phone: [phone]   Fax:  [phone]

## 2022-02-20 ENCOUNTER — Encounter (HOSPITAL_BASED_OUTPATIENT_CLINIC_OR_DEPARTMENT_OTHER): Payer: Self-pay | Admitting: Physical Therapy

## 2022-04-02 ENCOUNTER — Other Ambulatory Visit: Payer: Self-pay | Admitting: Obstetrics

## 2022-04-12 ENCOUNTER — Ambulatory Visit (INDEPENDENT_AMBULATORY_CARE_PROVIDER_SITE_OTHER): Payer: Medicaid Other | Admitting: Orthopaedic Surgery

## 2022-04-12 DIAGNOSIS — M48061 Spinal stenosis, lumbar region without neurogenic claudication: Secondary | ICD-10-CM

## 2022-04-12 NOTE — Progress Notes (Signed)
Chief Complaint: Lower back pain     History of Present Illness:    Ellen Booker is a 42 y.o. female presents today with multiple years of lower back issues.  She states that she has previously had to L5 discectomies in 2022 and subsequent L5-S1 fusion in 2023.  She states that at that time she was having pain down the leg which has resolved although she does have specific weakness in both legs as well as clonus down both legs following her fusion.  She has been working aggressively with physical therapy and has had 2 injections into the back without any relief.  She did have an MRI obtained a week prior and is now here today for further assessment.  Overall she is not able to do many activities and needs to walk with a cane in the left hand.      Surgical History:   As above  PMH/PSH/Family History/Social History/Meds/Allergies:    Past Medical History:  Diagnosis Date   Abdominal wall mass    left side   Depression    History of Helicobacter pylori infection    per pt 2016   Renal cyst, right    per CT 04-04-2016   Wears glasses    Past Surgical History:  Procedure Laterality Date   CESAREAN SECTION  2010   COLONOSCOPY  last one 06-19-2015   EXCISION ABDOMINAL WALL MASS  2012   right side   EXCISION MASS ABDOMINAL N/A 05/21/2016   Procedure: EXCISION ABDOMINAL WALL MASS;  Surgeon: Arta Bruce Kinsinger, MD;  Location: [place];  Service: General;  Laterality: N/A;   RESECTION OF ABDOMINAL MASS     Social History   Socioeconomic History   Marital status: Significant Other    Spouse name: Not on file   Number of children: Not on file   Years of education: Not on file   Highest education level: Not on file  Occupational History   Not on file  Tobacco Use   Smoking status: Every Day    Packs/day: 0.50    Years: 19.00    Total pack years: 9.50    Types: Cigarettes   Smokeless tobacco: Never  Vaping Use    Vaping Use: Never used  Substance and Sexual Activity   Alcohol use: No    Alcohol/week: 0.0 standard drinks of alcohol   Drug use: No   Sexual activity: Yes    Partners: Male    Birth control/protection: None  Other Topics Concern   Not on file  Social History Narrative   Not on file   Social Determinants of Health   Financial Resource Strain: Not on file  Food Insecurity: Not on file  Transportation Needs: Not on file  Physical Activity: Not on file  Stress: Not on file  Social Connections: Not on file   Family History  Problem Relation Age of Onset   HIV Father    Uterine cancer Other    No Known Allergies Current Outpatient Medications  Medication Sig Dispense Refill   cephALEXin (KEFLEX) 500 MG capsule Take 1 capsule (500 mg total) by mouth 2 (two) times daily. 14 capsule 0   clindamycin (CLEOCIN) 2 % vaginal cream Place 1 Applicatorful vaginally at bedtime. 40 g 0   Ferrous Sulfate (IRON PO) Take  by mouth.     fluconazole (DIFLUCAN) 150 MG tablet Take 1 tablet (150 mg total) by mouth every three (3) days as needed. 3 tablet 0   ketorolac (TORADOL) 10 MG tablet Take 1 tablet (10 mg total) by mouth every 6 (six) hours as needed. 20 tablet 0   lamoTRIgine (LAMICTAL) 100 MG tablet Take 150 mg by mouth 2 (two) times daily.      lamoTRIgine (LAMICTAL) 150 MG tablet Take 150 mg by mouth 2 (two) times daily.     lidocaine (LIDODERM) 5 % Place 1 patch onto the skin daily. Remove & Discard patch within 12 hours or as directed by MD 30 patch 0   methylPREDNISolone (MEDROL DOSEPAK) 4 MG TBPK tablet Per package instructions 21 tablet 0   metroNIDAZOLE (FLAGYL) 500 MG tablet Take 1 tablet (500 mg total) by mouth 2 (two) times daily. 14 tablet 0   predniSONE (DELTASONE) 20 MG tablet Take 2 tablets (40 mg total) by mouth daily. 10 tablet 0   Prenat w/o A-FeCbn-Meth-FA-DHA (PRENATE MINI) 29-0.6-0.4-350 MG CAPS Take 1 capsule by mouth daily before breakfast. 30 capsule 11   Probiotic  Product (PROBIOTIC-10 PO) Take by mouth.     TRINTELLIX 5 MG TABS tablet Take 5 mg by mouth daily.     varenicline (CHANTIX CONTINUING MONTH PAK) 1 MG tablet Take 1 tablet (1 mg total) by mouth 2 (two) times daily. 60 tablet 1   varenicline (CHANTIX STARTING MONTH PAK) 0.5 MG X 11 & 1 MG X 42 tablet Take one 0.5 mg tablet by mouth once daily for 3 days, then increase to one 0.5 mg tablet twice daily for 4 days, then increase to one 1 mg tablet twice daily. 53 tablet 0   vortioxetine HBr (TRINTELLIX) 20 MG TABS tablet Take 20 mg by mouth daily.     VRAYLAR 4.5 MG CAPS Take 4.5 mg by mouth daily.     VYVANSE 50 MG capsule Take 50 mg by mouth every morning.     No current facility-administered medications for this visit.   No results found.  Review of Systems:   A ROS was performed including pertinent positives and negatives as documented in the HPI.  Physical Exam :   Constitutional: NAD and appears stated age Neurological: Alert and oriented Psych: Appropriate affect and cooperative There were no vitals taken for this visit.   Comprehensive Musculoskeletal Exam:    She has weakness with knee extension and flexion of both legs as well as dorsiflexion of both sides.  Denies any loss of sensation in lower extremities.  There is clonus about her lower extremity joints.  Imaging:    I personally reviewed and interpreted the radiographs.   Assessment:   42 y.o. female with a history of previous L5-S1 fusion.  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  Given this I do believe she would benefit from a surgical consultation with Dr. Laurance Flatten.  I have asked that she asked for a copy of her MRI and bring this for consultation with Dr. Laurance Flatten regarding possible future revision surgery.  Plan :    -Plan for referral to Dr. Laurance Flatten for discussion of possible surgery     I  personally saw and evaluated the patient, and participated in the management and treatment plan.  Vanetta Mulders, MD Attending Physician, Orthopedic Surgery  This document was  dictated using Systems analyst. A reasonable attempt at proof reading has been made to minimize errors.

## 2022-04-13 ENCOUNTER — Telehealth: Payer: Medicaid Other | Admitting: Nurse Practitioner

## 2022-04-13 DIAGNOSIS — R1110 Vomiting, unspecified: Secondary | ICD-10-CM

## 2022-04-13 MED ORDER — ONDANSETRON HCL 4 MG PO TABS: 4.0000 mg | ORAL_TABLET | Freq: Three times a day (TID) | ORAL | 0 refills | Status: DC | PRN

## 2022-04-13 NOTE — Progress Notes (Signed)
E-Visit for Vomiting  We are sorry that you are not feeling well. Here is how we plan to help!  Based on what you have shared with me it looks like you have a Virus that is irritating your GI tract.  Vomiting is the forceful emptying of a portion of the stomach's content through the mouth.  Although nausea and vomiting can make you feel miserable, it's important to remember that these are not diseases, but rather symptoms of an underlying illness.  When we treat short term symptoms, we always caution that any symptoms that persist should be fully evaluated in a medical office.  I have prescribed a medication that will help alleviate your symptoms and allow you to stay hydrated:  Zofran 4 mg 1 tablet every 8 hours as needed for nausea and vomiting  HOME CARE: Drink clear liquids.  This is very important! Dehydration (the lack of fluid) can lead to a serious complication.  Start off with 1 tablespoon every 5 minutes for 8 hours. You may begin eating bland foods after 8 hours without vomiting.  Start with saltine crackers, white bread, rice, mashed potatoes, applesauce. After 48 hours on a bland diet, you may resume a normal diet. Try to go to sleep.  Sleep often empties the stomach and relieves the need to vomit.  GET HELP RIGHT AWAY IF:  Your symptoms do not improve or worsen within 2 days after treatment. You have a fever for over 3 days. You cannot keep down fluids after trying the medication.  MAKE SURE YOU:  Understand these instructions. Will watch your condition. Will get help right away if you are not doing well or get worse.   Thank you for choosing an e-visit.  Your e-visit answers were reviewed by a board certified advanced clinical practitioner to complete your personal care plan. Depending upon the condition, your plan could have included both over the counter or prescription medications.  Please review your pharmacy choice. Make sure the pharmacy is open so you can pick  up prescription now. If there is a problem, you may contact your provider through [employer] and have the prescription routed to another pharmacy.  Your safety is important to [place]. If you have drug allergies check your prescription carefully.   For the next 24 hours you can use MyChart to ask questions about today's visit, request a non-urgent call back, or ask for a work or school excuse. You will get an email in the next two days asking about your experience. I hope that your e-visit has been valuable and will speed your recovery.  Mary-Margaret Hassell Done, FNP   5-10 minutes spent reviewing and documenting in chart.

## 2022-04-22 ENCOUNTER — Ambulatory Visit (INDEPENDENT_AMBULATORY_CARE_PROVIDER_SITE_OTHER): Payer: Medicaid Other | Admitting: Orthopedic Surgery

## 2022-04-22 ENCOUNTER — Encounter: Payer: Self-pay | Admitting: Orthopedic Surgery

## 2022-04-22 ENCOUNTER — Ambulatory Visit (INDEPENDENT_AMBULATORY_CARE_PROVIDER_SITE_OTHER): Payer: Medicaid Other

## 2022-04-22 VITALS — BP 142/87 | HR 101 | Ht 64.0 in | Wt 211.0 lb

## 2022-04-22 DIAGNOSIS — M48061 Spinal stenosis, lumbar region without neurogenic claudication: Secondary | ICD-10-CM

## 2022-04-22 DIAGNOSIS — R2681 Unsteadiness on feet: Secondary | ICD-10-CM | POA: Diagnosis not present

## 2022-04-22 NOTE — Progress Notes (Signed)
Orthopedic Spine Surgery Office Note  Assessment: Patient is a 42 y.o. female with history of 3 lumbar surgeries (her most recent was a L5/S1 ALIF and posterior instrumented spinal fusion) who presents with low back pain particularly on the right side and leg unsteadiness/shaking   Plan: -Patient has tried physical therapy, narcotics, activity modification, Tylenol, NSAIDs, pain management, surgery, injections -I referred her to neurology because I cannot explain her leg tremors and unsteadiness.  She has no hyperreflexia or symptoms of cervical/thoracic myelopathy -Recommended L4/5 facet injections.  Referral provided to Dr. Ernestina Patches -May have to work her up further with a CT scan for pseudoarthrosis or malposition of the instrumentation. It is still early to work up a pseudarthrosis since her surgery was only in June 2023.  She is also a smoker so would not treat any pseudoarthrosis until she is completely nicotine free - this was discussed with her -Patient should return to office in 6 weeks, x-rays at next visit: none   Patient expressed understanding of the plan and all questions were answered to the patient's satisfaction.   ___________________________________________________________________________   History:  Patient is a 42 y.o. female with history of 3 lumbar spine surgeries who presents today for lumbar spine.  Patient had low back pain that radiated into her right lower extremity starting in 2022.  She was diagnosed with a L5-S1 disc herniation and underwent microdiscectomy at an outside institution.  She did not get adequate relief of her symptoms after that surgery and so she underwent revision L5/S1 hemilaminectomy and medial facetectomy.  She had an intraoperative durotomy that was repaired during that surgery.  That surgery happened on 11/28/2020 at an outside institution.  She said that this surgery helped relieve some of her leg pain, but not complete resolved.  She also had  significant back pain.  She then underwent a L5/S1 ALIF and posterior spinal fusion.  She says that after this surgery she got complete relief of her lower extremity radiating pain on the right side.  However, she noted weakness in her legs and urinary incontinence when her legs went weak.  Her legs periodically go weak.  There is no particular motion or activity that causes the onset of this leg weakness.  She is also noticed that her legs are shaking and she feels unsteady.  She has had to start using a cane because of this unsteadiness.  She has not noticed any issues with hand dexterity.  She still has persistent low back pain specially on the right side.  This back pain did not get any better with the most recent surgery.   Weakness: Yes, legs feel weak and give out at times Symptoms of imbalance: Yes, feels off balance especially when the legs feel weak Paresthesias and numbness: Denies Bowel or bladder incontinence: Yes, when the legs are weak, she develops urinary incontinence.  No bowel incontinence.  No consistent urinary incontinence. Saddle anesthesia: Denies  Treatments tried: physical therapy, narcotics, activity modification, Tylenol, NSAIDs, pain management, surgery, injections  Review of systems: Denies fevers and chills, night sweats, unexplained weight loss, history of cancer, pain that wakes them at night  Past medical history: Hyperlipidemia Depression Anxiety Chronic pain Bipolar Endometriosis  Allergies: NKDA  Past surgical history:  L5/S1 microdiscectomy L5/S1 revision microdiscectomy, hemilaminotomy, and medial facetectomy L5/S1 ALIF and PSIF C section Abdomina cyst excision  Social history: Reports use of nicotine product (smoking, vaping, patches, smokeless) Alcohol use: denies Reports marijuana use, denies other recreational drug use  Physical Exam:  General: no acute distress, appears stated age Neurologic: alert, answering questions  appropriately, following commands Respiratory: unlabored breathing on room air, symmetric chest rise Psychiatric: appropriate affect, normal cadence to speech   MSK (spine):  -Strength exam      Left  Right EHL    4/5  4/5 TA    5/5  5/5 GSC    5/5  5/5 Knee extension  5/5  5/5 Hip flexion   5/5  5/5  -Sensory exam    Sensation intact to light touch in L3-S1 nerve distributions of bilateral lower extremities  -Achilles DTR: 1/4 on the left, 1/4 on the right -Patellar tendon DTR: 1/4 on the left, 1/4 on the right  -Straight leg raise: negative -Contralateral straight leg raise: negative -Femoral nerve stretch test: negative bilaterally -Clonus: no beats bilaterally  -Left hip exam: no pain through range of motion, negative stinchfield, negative FABER -Right hip exam: no pain through range of motion, negative stinchfield, negative FABER -Unsteadiness with tandem gait, wide based gait  -Negative hoffman bilaterally -Negative grip and release test -No interosseus muscle wasting seen  Imaging: XR of the lumbar spine from 04/22/2022 was independently reviewed and interpreted, showing pedicle screws at L5 and S1 with no lucency around the screws. Screws have not backed out. Interbody cage at L5/S1 that appears in appropriate position. No fracture or dislocation. No evidence of instability on flexion/extension.   MRI (on disc) of the lumbar spine from 04/01/2022 was independently reviewed and interpreted, showing lateral recess stenosis at L4/5. No other significant stenosis. T2 signal within the bilateral facets. Interbody device appears posterior to the vertebral body on sagittal and coronal sequences but mri not the best modality to evaluate position.    Patient name: Ellen Booker Patient MRN: [id number] Date of visit: 04/22/22

## 2022-04-30 NOTE — Progress Notes (Unsigned)
Initial neurology clinic note  SERVICE DATE: 05/01/22  Reason for Evaluation: Consultation requested by Callie Fielding, MD for an opinion regarding leg unsteadiness/shaking. My final recommendations will be communicated back to the requesting physician by way of shared medical record or letter to requesting physician via [place] mail.  HPI: This is Ms. Ellen Booker, a 42 y.o. female with a medical history of low back pain c/b lumbar stenosis s/p 3 lumbar surgeries, HLD, bipolar, anxiety, endometriosis, current smoker who presents to neurology clinic with the chief complaint of leg unsteadiness/shaking. The patient is alone today.  Patient is having shaking of both legs. When she tries to lift her legs or walk, she feels weak and her legs will shake. When she sits, her symptoms will improve. She does have back pain into the right side of her low back. When her legs shake she sometimes has difficulty holding her bladder. She had 3 episodes of urinary incontinence (last episode about 4 days ago) where she loses a little bit of her bladder contents (not a lot).  She feels off balance when walking and walks with a cane since 10/2021. She has had 3 falls, most recently in 02/2022. She was bagging groceries, her legs started shaking, and her legs gave out on her.  Patient was doing PT, last in 02/2022. She will be doing more water therapy and dry needling but awaiting an appointment. She does not think it has been very helpful and has not been able to gain any strength back in her legs. She was recommended for water therapy due to no improvement.  Spine history per Dr. Laurance Flatten in ortho from 04/22/22: Patient had low back pain that radiated into her right lower extremity starting in 2022. She was diagnosed with a L5-S1 disc herniation and underwent microdiscectomy at an outside institution. She did not get adequate relief of her symptoms after that surgery and so she underwent revision L5/S1 hemilaminectomy  and medial facetectomy. She had an intraoperative durotomy that was repaired during that surgery. That surgery happened on 11/28/2020 at an outside institution. She said that this surgery helped relieve some of her leg pain, but not complete resolved. She also had significant back pain. She then underwent a L5/S1 ALIF and posterior spinal fusion. She says that after this surgery she got complete relief of her lower extremity radiating pain on the right side. However, she noted weakness in her legs and urinary incontinence when her legs went weak. Her legs periodically go weak. There is no particular motion or activity that causes the onset of this leg weakness. She is also noticed that her legs are shaking and she feels unsteady. She has had to start using a cane because of this unsteadiness. She has not noticed any issues with hand dexterity. She still has persistent low back pain specially on the right side. This back pain did not get any better with the most recent surgery.   Imaging: XR of the lumbar spine from 04/22/2022 was independently reviewed and interpreted, showing pedicle screws at L5 and S1 with no lucency around the screws. Screws have not backed out. Interbody cage at L5/S1 that appears in appropriate position. No fracture or dislocation. No evidence of instability on flexion/extension.    MRI (on disc) of the lumbar spine from 04/01/2022 was independently reviewed and interpreted, showing lateral recess stenosis at L4/5. No other significant stenosis. T2 signal within the bilateral facets. Interbody device appears posterior to the vertebral body on sagittal and  coronal sequences but mri not the best modality to evaluate position.   Patient's initial radiculopathy symptoms were pain in left leg. After the 2nd surgery, she then had pain on her right side, stating there were "nerve complications."   Patient is scheduled for injections in the spine 05/08/22.  She has never had an EMG.  Patient  is currently on topamax 200 mg daily for weight loss. She takes zanaflex 4 mg PRN. She also takes buproprion 150 mg.  She previously took Cymbalta 60 mg but this did not help. She has also tried lyrica and gabapentin that did not help.   She report any constitutional symptoms like fever, night sweats, anorexia or unintentional weight loss. She did lose some weight around her surgeries (about 40 lbs).  EtOH use: occasionally, once per week (1 shot of liquor)  Restrictive diet? No Family history of neuropathy/myopathy/NM disease? none  MEDICATIONS:  Outpatient Encounter Medications as of 05/01/2022  Medication Sig   lamoTRIgine (LAMICTAL) 150 MG tablet Take 150 mg by mouth 2 (two) times daily.   VYVANSE 50 MG capsule Take 50 mg by mouth every morning.   cephALEXin (KEFLEX) 500 MG capsule Take 1 capsule (500 mg total) by mouth 2 (two) times daily.   clindamycin (CLEOCIN) 2 % vaginal cream Place 1 Applicatorful vaginally at bedtime.   Ferrous Sulfate (IRON PO) Take by mouth.   fluconazole (DIFLUCAN) 150 MG tablet Take 1 tablet (150 mg total) by mouth every three (3) days as needed.   ketorolac (TORADOL) 10 MG tablet Take 1 tablet (10 mg total) by mouth every 6 (six) hours as needed.   lamoTRIgine (LAMICTAL) 100 MG tablet Take 150 mg by mouth 2 (two) times daily.    lidocaine (LIDODERM) 5 % Place 1 patch onto the skin daily. Remove & Discard patch within 12 hours or as directed by MD   methylPREDNISolone (MEDROL DOSEPAK) 4 MG TBPK tablet Per package instructions   metroNIDAZOLE (FLAGYL) 500 MG tablet Take 1 tablet (500 mg total) by mouth 2 (two) times daily.   ondansetron (ZOFRAN) 4 MG tablet Take 1 tablet (4 mg total) by mouth every 8 (eight) hours as needed for nausea or vomiting.   predniSONE (DELTASONE) 20 MG tablet Take 2 tablets (40 mg total) by mouth daily.   Prenat w/o A-FeCbn-Meth-FA-DHA (PRENATE MINI) 29-0.6-0.4-350 MG CAPS Take 1 capsule by mouth daily before breakfast.   Probiotic  Product (PROBIOTIC-10 PO) Take by mouth.   TRINTELLIX 5 MG TABS tablet Take 5 mg by mouth daily.   varenicline (CHANTIX CONTINUING MONTH PAK) 1 MG tablet Take 1 tablet (1 mg total) by mouth 2 (two) times daily.   varenicline (CHANTIX STARTING MONTH PAK) 0.5 MG X 11 & 1 MG X 42 tablet Take one 0.5 mg tablet by mouth once daily for 3 days, then increase to one 0.5 mg tablet twice daily for 4 days, then increase to one 1 mg tablet twice daily.   vortioxetine HBr (TRINTELLIX) 20 MG TABS tablet Take 20 mg by mouth daily.   VRAYLAR 4.5 MG CAPS Take 4.5 mg by mouth daily.   No facility-administered encounter medications on file as of 05/01/2022.    PAST MEDICAL HISTORY: Past Medical History:  Diagnosis Date   Abdominal wall mass    left side   Depression    History of Helicobacter pylori infection    per pt 2016   Renal cyst, right    per CT 04-04-2016   Wears glasses  PAST SURGICAL HISTORY: Past Surgical History:  Procedure Laterality Date   back  08/2021   CESAREAN SECTION  2010   COLONOSCOPY  last one 06-19-2015   EXCISION ABDOMINAL WALL MASS  2012   right side   EXCISION MASS ABDOMINAL N/A 05/21/2016   Procedure: EXCISION ABDOMINAL WALL MASS;  Surgeon: Arta Bruce Kinsinger, MD;  Location: [facility];  Service: General;  Laterality: N/A;   RESECTION OF ABDOMINAL MASS      ALLERGIES: No Known Allergies  FAMILY HISTORY: Family History  Problem Relation Age of Onset   HIV Father    Uterine cancer Other     SOCIAL HISTORY: Social History   Tobacco Use   Smoking status: Every Day    Packs/day: 0.50    Years: 19.00    Total pack years: 9.50    Types: Cigarettes   Smokeless tobacco: Never  Vaping Use   Vaping Use: Never used  Substance Use Topics   Alcohol use: No    Comment: occ   Drug use: No   Social History   Social History Narrative   Are you right handed or left handed? right   Are you currently employed ? no   What is your current  occupation?   Do you live at home alone? With daughter and significant other   Who lives with you?    What type of home do you live in: 1 story or 2 story?          OBJECTIVE: PHYSICAL EXAM: BP (!) 155/74   Pulse (!) 116   Ht 5\' 3"  (1.6 m)   SpO2 100%   BMI 37.38 kg/m   General: General appearance: Awake and alert. No distress. Cooperative with exam.  Skin: No obvious rash or jaundice. HEENT: Atraumatic. Anicteric. Lungs: Non-labored breathing on room air  Extremities: No obvious deformity.  Musculoskeletal: No obvious joint swelling. Psych: Affect appropriate.  Neurological: Mental Status: Alert. Speech fluent. No pseudobulbar affect Cranial Nerves: CNII: No RAPD. Visual fields grossly intact. CNIII, IV, VI: PERRL. No nystagmus. EOMI. CN V: Facial sensation intact bilaterally to fine touch. CN VII: Facial muscles symmetric and strong. No ptosis at rest. CN VIII: Hearing grossly intact bilaterally. CN IX: No hypophonia. CN X: Palate elevates symmetrically. CN XI: Full strength shoulder shrug bilaterally. CN XII: Tongue protrusion full and midline. No atrophy or fasciculations. No significant dysarthria Motor: Tone is normal. Some give way weakness and shakiness with testing, including in upper extremities, but more prominent in lower extremities.  Individual muscle group testing (MRC grade out of 5):  Movement     Neck flexion 5    Neck extension 5     Right Left   Shoulder abduction 5 5   Elbow flexion 5 5   Elbow extension 5 5   Finger abduction - FDI 5 5   Finger abduction - ADM 5 5   Finger extension 5 5   Finger flexion  5 5    Hip flexion 5 5   Hip extension 5 5   Hip adduction 5 5   Hip abduction 5 5   Knee extension 5 5   Knee flexion 5 5   Dorsiflexion 5 5   Plantarflexion 5 5   Great toe extension 5 5   Great toe flexion 5 5     Reflexes:  Right Left   Bicep 2+ 2+   Tricep 2+ 2+   BrRad 2+ 2+   Knee 2+  2+   Ankle 2+ 2+     Pathological Reflexes: Babinski: flexor response bilaterally Hoffman: absent bilaterally Troemner: absent bilaterally Sensation: Pinprick: Intact in all extremities Vibration: Intact in upper extremities, 7 seconds in right great toe, 11 seconds in left great toe Proprioception: Intact in bilateral great toes. Coordination: Intact finger-to- nose-finger and heel to shin bilaterally. Romberg negative. Gait: Able to rise from chair with arms crossed unassisted. Shakes in knees upon standing. Walks with a cane. Narrow based gait, walks cautiously and slowly, uncertain steps.  Lab and Test Review: External labs: BMP (08/31/21): significant for mildly elevated glucose (106) Ferritin (08/16/21): 8.4 HbA1c (08/16/21): 5.5  Imaging: MRI lumbar spine wo contrast (09/30/20): FINDINGS: Segmentation:  Standard.   Alignment:  Normal.   Vertebrae: No fracture, evidence of discitis, or bone lesion. Degenerative endplate signal change is seen at L5-S1 eccentric to the right.   Conus medullaris and cauda equina: Conus extends to the L1 level. Conus and cauda equina appear normal.   Paraspinal and other soft tissues: Right renal cyst noted.   Disc levels:   The T11-12 to L3-4 levels are negative.   L4-5: There is disc desiccation and a shallow broad-based bulge. Mild facet degenerative disease and ligamentum flavum thickening are seen. Mild central canal and bilateral foraminal narrowing.   L5-S1: Status post posterior decompression. There is moderate facet arthropathy. A large right paracentral and subarticular recess disc protrusion deforms the right aspect of the thecal sac and impinges on the right S1 root. Mild to moderate foraminal narrowing is worse on the right.   IMPRESSION: Status post L5-S1 decompression. A large right paracentral and subarticular recess disc protrusion impinges on the right S1 root and deforms the right side of the thecal sac. There is also moderate right  foraminal narrowing at this level.   Mild central canal and bilateral foraminal narrowing L4-5.  ASSESSMENT: Ellen Booker is a 42 y.o. female who presents for evaluation of leg shakiness, weakness, and falls. She has a relevant medical history of low back pain c/b lumbar stenosis s/p 3 lumbar surgeries, HLD, bipolar, anxiety, endometriosis, current smoker. Her neurological examination is pertinent for shakiness in extremities (legs > arms) with standing and muscle testing with give way weakness diffusely, but full strength with best effort. Available diagnostic data is significant for MRI lumbar spine with post surgical changes and impingement of right S1 nerve root. The etiology of patient's symptoms is likely multifactorial. Certainly there are contributions from her lumbosacral stenosis and prior surgeries, but her muscle strength, reflexes, and mostly sensation are intact. Her give way weakness and shakiness even in her upper extremities suggests a psychological component as well, likely due to concern for falling and lack of confidence in her muscle strength, as there is no clear deficit to explain symptoms. I will check some lab work for treatable causes of paresthesias today. We discussed EMG, but decided to defer for now due to patient preference.  PLAN: -Blood work: B1, B12 -Discussed EMG, deferring for now -Can try lidocaine cream PRN for tingling, burning, or pain as she did not get relief from other neuropathic agents -Continue PT  -Return to clinic as needed  The impression above as well as the plan as outlined below were extensively discussed with the patient who voiced understanding. All questions were answered to their satisfaction.  The patient was counseled on pertinent fall precautions per the printed material provided today, and as noted under the "Patient Instructions" section below.  When available,  results of the above investigations and possible further recommendations  will be communicated to the patient via telephone/MyChart. Patient to call office if not contacted after expected testing turnaround time.   Total time spent reviewing records, interview, history/exam, documentation, and coordination of care on day of encounter:  55 min   Thank you for allowing me to participate in patient's care.  If I can answer any additional questions, I would be pleased to do so.  Kai Levins, MD   CC: Sue Lush, PA-C [address] [place] [id number]  CC: Referring provider: Callie Fielding, MD [address] [place],  [place] [address]

## 2022-05-01 ENCOUNTER — Other Ambulatory Visit (INDEPENDENT_AMBULATORY_CARE_PROVIDER_SITE_OTHER): Payer: Medicaid Other

## 2022-05-01 ENCOUNTER — Encounter: Payer: Self-pay | Admitting: Neurology

## 2022-05-01 ENCOUNTER — Ambulatory Visit: Payer: Medicaid Other | Admitting: Neurology

## 2022-05-01 VITALS — BP 119/80 | HR 116 | Ht 63.0 in

## 2022-05-01 DIAGNOSIS — R251 Tremor, unspecified: Secondary | ICD-10-CM | POA: Diagnosis not present

## 2022-05-01 DIAGNOSIS — M5416 Radiculopathy, lumbar region: Secondary | ICD-10-CM | POA: Diagnosis not present

## 2022-05-01 DIAGNOSIS — R29898 Other symptoms and signs involving the musculoskeletal system: Secondary | ICD-10-CM

## 2022-05-01 LAB — VITAMIN B12: Vitamin B-12: 158 pg/mL — ABNORMAL LOW (ref 211–911)

## 2022-05-01 NOTE — Patient Instructions (Addendum)
I saw you today for shakiness in your legs. Overall, I think your examination is encouraging without clear signs of ongoing nerve pinching in the back. I expect you will continue to improve with physical therapy and getting your confidence in your leg strength and walking.  We discussed EMG (muscle and nerve test), but we deferred that for now. We can always rediscuss this if your symptoms worsen or do not continue to improve.  You can also try Lidocaine cream as needed for leg pain, burning, tingling. Apply wear you have pain, tingling, or burning. Wear gloves to prevent your hands being numb. This can be bought over the counter at any drug store or online.   Return to clinic as needed.  The physicians and staff at [facility] are committed to providing excellent care. You may receive a survey requesting feedback about your experience at our office. We strive to receive "very good" responses to the survey questions. If you feel that your experience would prevent you from giving the office a "very good " response, please contact our office to try to remedy the situation. We may be reached at [phone]. Thank you for taking the time out of your busy day to complete the survey.  Kai Levins, MD [facility]  Preventing Falls at [facility] are common, often dreaded events in the lives of older people. Aside from the obvious injuries and even death that may result, fall can cause wide-ranging consequences including loss of independence, mental decline, decreased activity and mobility. Younger people are also at risk of falling, especially those with chronic illnesses and fatigue.  Ways to reduce risk for falling Examine diet and medications. Warm foods and alcohol dilate blood vessels, which can lead to dizziness when standing. Sleep aids, antidepressants and pain medications can also increase the likelihood of a fall.  Get a vision exam. Poor vision, cataracts and glaucoma increase  the chances of falling.  Check foot gear. Shoes should fit snugly and have a sturdy, nonskid sole and a broad, low heel  Participate in a physician-approved exercise program to build and maintain muscle strength and improve balance and coordination. Programs that use ankle weights or stretch bands are excellent for muscle-strengthening. Water aerobics programs and low-impact Tai Chi programs have also been shown to improve balance and coordination.  Increase vitamin D intake. Vitamin D improves muscle strength and increases the amount of calcium the body is able to absorb and deposit in bones.  How to prevent falls from common hazards Floors - Remove all loose wires, cords, and throw rugs. Minimize clutter. Make sure rugs are anchored and smooth. Keep furniture in its usual place.  Chairs -- Use chairs with straight backs, armrests and firm seats. Add firm cushions to existing pieces to add height.  Bathroom - Install grab bars and non-skid tape in the tub or shower. Use a bathtub transfer bench or a shower chair with a back support Use an elevated toilet seat and/or safety rails to assist standing from a low surface. Do not use towel racks or bathroom tissue holders to help you stand.  Lighting - Make sure halls, stairways, and entrances are well-lit. Install a night light in your bathroom or hallway. Make sure there is a light switch at the top and bottom of the staircase. Turn lights on if you get up in the middle of the night. Make sure lamps or light switches are within reach of the bed if you have to get up during  the night.  Kitchen - Install non-skid rubber mats near the sink and stove. Clean spills immediately. Store frequently used utensils, pots, pans between waist and eye level. This helps prevent reaching and bending. Sit when getting things out of lower cupboards.  Living room/ Bedrooms - Place furniture with wide spaces in between, giving enough room to move around. Establish a  route through the living room that gives you something to hold onto as you walk.  Stairs - Make sure treads, rails, and rugs are secure. Install a rail on both sides of the stairs. If stairs are a threat, it might be helpful to arrange most of your activities on the lower level to reduce the number of times you must climb the stairs.  Entrances and doorways - Install metal handles on the walls adjacent to the doorknobs of all doors to make it more secure as you travel through the doorway.  Tips for maintaining balance Keep at least one hand free at all times. Try using a backpack or fanny pack to hold things rather than carrying them in your hands. Never carry objects in both hands when walking as this interferes with keeping your balance.  Attempt to swing both arms from front to back while walking. This might require a conscious effort if Parkinson's disease has diminished your movement. It will, however, help you to maintain balance and posture, and reduce fatigue.  Consciously lift your feet off of the ground when walking. Shuffling and dragging of the feet is a common culprit in losing your balance.  When trying to navigate turns, use a "U" technique of facing forward and making a wide turn, rather than pivoting sharply.  Try to stand with your feet shoulder-length apart. When your feet are close together for any length of time, you increase your risk of losing your balance and falling.  Do one thing at a time. Don't try to walk and accomplish another task, such as reading or looking around. The decrease in your automatic reflexes complicates motor function, so the less distraction, the better.  Do not wear rubber or gripping soled shoes, they might "catch" on the floor and cause tripping.  Move slowly when changing positions. Use deliberate, concentrated movements and, if needed, use a grab bar or walking aid. Count 15 seconds between each movement. For example, when rising from a seated  position, wait 15 seconds after standing to begin walking.  If balance is a continuous problem, you might want to consider a walking aid such as a cane, walking stick, or walker. Once you've mastered walking with help, you might be ready to try it on your own again.

## 2022-05-05 LAB — VITAMIN B1: Vitamin B1 (Thiamine): 7 nmol/L — ABNORMAL LOW (ref 8–30)

## 2022-05-06 ENCOUNTER — Ambulatory Visit: Payer: Self-pay

## 2022-05-06 ENCOUNTER — Ambulatory Visit (INDEPENDENT_AMBULATORY_CARE_PROVIDER_SITE_OTHER): Payer: Medicaid Other | Admitting: Physical Medicine and Rehabilitation

## 2022-05-06 ENCOUNTER — Encounter: Payer: Self-pay | Admitting: Neurology

## 2022-05-06 VITALS — BP 124/84 | HR 88

## 2022-05-06 DIAGNOSIS — M47816 Spondylosis without myelopathy or radiculopathy, lumbar region: Secondary | ICD-10-CM | POA: Diagnosis not present

## 2022-05-06 MED ORDER — BUPIVACAINE HCL 0.5 % IJ SOLN
3.0000 mL | Freq: Once | INTRAMUSCULAR | Status: AC
  Administered 2022-05-06: 3 mL

## 2022-05-06 NOTE — Patient Instructions (Signed)

## 2022-05-06 NOTE — Progress Notes (Signed)
Functional Pain Scale - descriptive words and definitions  Distracting (5)    Aware of pain/able to complete some ADL's but limited by pain/sleep is affected and active distractions are only slightly useful. Moderate range order  Average Pain 4   +Driver, -BT, -Dye Allergies. Lower back pain on both sides with no radiation into legs

## 2022-05-16 ENCOUNTER — Encounter: Payer: Self-pay | Admitting: Orthopedic Surgery

## 2022-05-16 DIAGNOSIS — R2681 Unsteadiness on feet: Secondary | ICD-10-CM

## 2022-05-16 NOTE — Progress Notes (Signed)
Ellen Booker - 42 y.o. female MRN [id number]  Date of birth: [birth year]  Office Visit Note: Visit Date: 05/06/2022 PCP: Sue Lush, PA-C Referred by: Sue Lush, PA-C  Subjective: Chief Complaint  Patient presents with   Lower Back - Pain   HPI:  Ellen Booker is a 41 y.o. female who comes in today at the request of Barnet Pall, FNP and Ileene Rubens, MD for planned Bilateral  L4-5 Lumbar facet/medial branch block with fluoroscopic guidance.  The patient has failed conservative care including home exercise, medications, time and activity modification.  This injection will be diagnostic and hopefully therapeutic.  Please see requesting physician notes for further details and justification.  Exam has shown concordant pain with facet joint loading.   ROS Otherwise per HPI.  Assessment & Plan: Visit Diagnoses:    ICD-10-CM   1. Spondylosis without myelopathy or radiculopathy, lumbar region  M47.816 XR C-ARM NO REPORT    Facet Injection    bupivacaine (MARCAINE) 0.5 % (with pres) injection 3 mL      Plan: No additional findings.   Meds & Orders:  Meds ordered this encounter  Medications   bupivacaine (MARCAINE) 0.5 % (with pres) injection 3 mL    Orders Placed This Encounter  Procedures   Facet Injection   XR C-ARM NO REPORT    Follow-up: Return for visit to requesting provider as needed.   Procedures: No procedures performed  Lumbar Diagnostic Facet Joint Nerve Block with Fluoroscopic Guidance   Patient: Ellen Booker      Date of Birth: 22-Sep-1980 MRN: [id number] PCP: Sue Lush, PA-C      Visit Date: 05/06/2022   Universal Protocol:    Date/Time: 02/22/248:27 AM  Consent Given By: the patient  Position: PRONE  Additional Comments: Vital signs were monitored before and after the procedure. Patient was prepped and draped in the usual sterile fashion. The correct patient, procedure, and site was verified.   Injection Procedure  Details:   Procedure diagnoses:  1. Spondylosis without myelopathy or radiculopathy, lumbar region      Meds Administered:  Meds ordered this encounter  Medications   bupivacaine (MARCAINE) 0.5 % (with pres) injection 3 mL     Laterality: Bilateral  Location/Site: L4-L5, L3 and L4 medial branches  Needle: 5.0 in., 25 ga.  Short bevel or Quincke spinal needle  Needle Placement: Oblique pedical  Findings:   -Comments: There was excellent flow of contrast along the articular pillars without intravascular flow.  Procedure Details: The fluoroscope beam is vertically oriented in AP and then obliqued 15 to 20 degrees to the ipsilateral side of the desired nerve to achieve the "Scotty dog" appearance.  The skin over the target area of the junction of the superior articulating process and the transverse process (sacral ala if blocking the L5 dorsal rami) was locally anesthetized with a 1 ml volume of 1% Lidocaine without Epinephrine.  The spinal needle was inserted and advanced in a trajectory view down to the target.   After contact with periosteum and negative aspirate for blood and CSF, correct placement without intravascular or epidural spread was confirmed by injecting 0.5 ml. of Isovue-250.  A spot radiograph was obtained of this image.    Next, a 0.5 ml. volume of the injectate described above was injected. The needle was then redirected to the other facet joint nerves mentioned above if needed.  Prior to the procedure, the patient was given a Pain Diary which  was completed for baseline measurements.  After the procedure, the patient rated their pain every 30 minutes and will continue rating at this frequency for a total of 5 hours.  The patient has been asked to complete the Diary and return to [place] by mail, fax or hand delivered as soon as possible.   Additional Comments:  The patient tolerated the procedure well Dressing: 2 x 2 sterile gauze and Band-Aid    Post-procedure  details: Patient was observed during the procedure. Post-procedure instructions were reviewed.  Patient left the clinic in stable condition.   Clinical History: MRI lumbar spine without and with contrast:   INDICATION: Back pain.   TECHNIQUE: Sagittal and axial T1 and T2-weighted sequences were performed. Additional sagittal STIR images were performed. Additional sagittal and axial T1-weighted sequences were performed after intravenous injection of contrast. CONTRAST: 9 mL of GADOBUTROL 1 MMOL/ML IV SOLN.   COMPARISON: June 26, 2021   FINDINGS:  There are 5 nonrib-bearing lumbar-type vertebrae. The conus medullaris terminates at a normal level. Interval appearance of bilateral transpedicular screws with interconnecting rods at L5-S1 and with anterior interbody fusion at L5-S1. No listhesis. The vertebral body heights are maintained. No significant intervertebral disc height loss. No suspicious marrow signal. No suspicious enhancement.   L1-2: No significant spinal canal or neural foraminal stenosis.  L2-3: No significant spinal canal or neural foraminal stenosis.  L3-4: No significant spinal canal or neural foraminal stenosis.  L4-5: Disc bulge contributes to mild right neural foraminal stenosis. No significant spinal canal stenosis.  L5-S1: No significant spinal canal or neural foraminal stenosis.    IMPRESSION:  Interval postsurgical changes at L5-S1 as detailed above.   Electronically Signed by: Linden Dolin, MD on 04/02/2022     Objective:  VS:  HT:    WT:   BMI:     BP:124/84  HR:88bpm  TEMP: ( )  RESP:  Physical Exam Vitals and nursing note reviewed.  Constitutional:      General: She is not in acute distress.    Appearance: Normal appearance. She is not ill-appearing.  HENT:     Head: Normocephalic and atraumatic.     Right Ear: External ear normal.     Left Ear: External ear normal.  Eyes:     Extraocular Movements: Extraocular movements intact.   Cardiovascular:     Rate and Rhythm: Normal rate.     Pulses: Normal pulses.  Pulmonary:     Effort: Pulmonary effort is normal. No respiratory distress.  Abdominal:     General: There is no distension.     Palpations: Abdomen is soft.  Musculoskeletal:        General: Tenderness present.     Cervical back: Neck supple.     Right lower leg: No edema.     Left lower leg: No edema.     Comments: Patient has good distal strength with no pain over the greater trochanters.  No clonus or focal weakness.  Skin:    Findings: No erythema, lesion or rash.  Neurological:     General: No focal deficit present.     Mental Status: She is alert and oriented to person, place, and time.     Sensory: No sensory deficit.     Motor: No weakness or abnormal muscle tone.     Coordination: Coordination normal.  Psychiatric:        Mood and Affect: Mood normal.        Behavior: Behavior normal.  Imaging: No results found.

## 2022-05-16 NOTE — Procedures (Signed)
Lumbar Diagnostic Facet Joint Nerve Block with Fluoroscopic Guidance   Patient: Ellen Booker      Date of Birth: [birth year] MRN: [id number] PCP: Sue Lush, PA-C      Visit Date: 05/06/2022   Universal Protocol:    Date/Time: 02/22/248:27 AM  Consent Given By: the patient  Position: PRONE  Additional Comments: Vital signs were monitored before and after the procedure. Patient was prepped and draped in the usual sterile fashion. The correct patient, procedure, and site was verified.   Injection Procedure Details:   Procedure diagnoses:  1. Spondylosis without myelopathy or radiculopathy, lumbar region      Meds Administered:  Meds ordered this encounter  Medications   bupivacaine (MARCAINE) 0.5 % (with pres) injection 3 mL     Laterality: Bilateral  Location/Site: L4-L5, L3 and L4 medial branches  Needle: 5.0 in., 25 ga.  Short bevel or Quincke spinal needle  Needle Placement: Oblique pedical  Findings:   -Comments: There was excellent flow of contrast along the articular pillars without intravascular flow.  Procedure Details: The fluoroscope beam is vertically oriented in AP and then obliqued 15 to 20 degrees to the ipsilateral side of the desired nerve to achieve the "Scotty dog" appearance.  The skin over the target area of the junction of the superior articulating process and the transverse process (sacral ala if blocking the L5 dorsal rami) was locally anesthetized with a 1 ml volume of 1% Lidocaine without Epinephrine.  The spinal needle was inserted and advanced in a trajectory view down to the target.   After contact with periosteum and negative aspirate for blood and CSF, correct placement without intravascular or epidural spread was confirmed by injecting 0.5 ml. of Isovue-250.  A spot radiograph was obtained of this image.    Next, a 0.5 ml. volume of the injectate described above was injected. The needle was then redirected to the other facet joint  nerves mentioned above if needed.  Prior to the procedure, the patient was given a Pain Diary which was completed for baseline measurements.  After the procedure, the patient rated their pain every 30 minutes and will continue rating at this frequency for a total of 5 hours.  The patient has been asked to complete the Diary and return to [place] by mail, fax or hand delivered as soon as possible.   Additional Comments:  The patient tolerated the procedure well Dressing: 2 x 2 sterile gauze and Band-Aid    Post-procedure details: Patient was observed during the procedure. Post-procedure instructions were reviewed.  Patient left the clinic in stable condition.

## 2022-05-23 ENCOUNTER — Encounter: Payer: Self-pay | Admitting: Obstetrics & Gynecology

## 2022-05-23 ENCOUNTER — Ambulatory Visit (INDEPENDENT_AMBULATORY_CARE_PROVIDER_SITE_OTHER): Payer: Medicaid Other | Admitting: Obstetrics & Gynecology

## 2022-05-23 ENCOUNTER — Other Ambulatory Visit (HOSPITAL_COMMUNITY)
Admission: RE | Admit: 2022-05-23 | Discharge: 2022-05-23 | Disposition: A | Discharge status: Home / Self Care | Payer: Medicaid Other | Source: Ambulatory Visit | Attending: Obstetrics | Admitting: Obstetrics

## 2022-05-23 VITALS — BP 125/84 | HR 82 | Ht 64.0 in | Wt 217.0 lb

## 2022-05-23 DIAGNOSIS — F17200 Nicotine dependence, unspecified, uncomplicated: Secondary | ICD-10-CM | POA: Diagnosis not present

## 2022-05-23 DIAGNOSIS — N920 Excessive and frequent menstruation with regular cycle: Secondary | ICD-10-CM

## 2022-05-23 NOTE — Progress Notes (Signed)
Patient ID: Ellen Booker, female   DOB: 09-18-1980, 42 y.o.   MRN: [id number]  Chief Complaint  Patient presents with   Gynecologic Exam  Heavy prolonged LMP  HPI Ellen Booker is a 41 y.o. female.  [id number] Patient's last menstrual period was 05/02/2022. No bleeding now but she had unusual long period for 20 days. Menses are o/w regular, no BCM. She was told her tubes are blocked.  HPI  Past Medical History:  Diagnosis Date   Abdominal wall mass    left side   Anemia    Anxiety    Depression    History of Helicobacter pylori infection    per pt 2016   Renal cyst, right    per CT 04-04-2016   Vaginal Pap smear, abnormal    Wears glasses     Past Surgical History:  Procedure Laterality Date   back  08/2021   CESAREAN SECTION  2010   COLONOSCOPY  last one 06-19-2015   EXCISION ABDOMINAL WALL MASS  2012   right side   EXCISION MASS ABDOMINAL N/A 05/21/2016   Procedure: EXCISION ABDOMINAL WALL MASS;  Surgeon: Arta Bruce Kinsinger, MD;  Location: [facility];  Service: General;  Laterality: N/A;   RESECTION OF ABDOMINAL MASS      Family History  Problem Relation Age of Onset   HIV Father    Cancer Maternal Grandmother    Uterine cancer Other     Social History Social History   Tobacco Use   Smoking status: Every Day    Packs/day: 0.50    Years: 19.00    Total pack years: 9.50    Types: Cigarettes   Smokeless tobacco: Never  Vaping Use   Vaping Use: Never used  Substance Use Topics   Alcohol use: No    Comment: occ   Drug use: Yes    Types: Marijuana    No Known Allergies  Current Outpatient Medications  Medication Sig Dispense Refill   varenicline (CHANTIX) 0.5 MG tablet Take 0.5 mg by mouth 2 (two) times daily.     buPROPion (WELLBUTRIN XL) 150 MG 24 hr tablet Take 150 mg by mouth daily.     lamoTRIgine (LAMICTAL) 150 MG tablet Take 150 mg by mouth 2 (two) times daily.     oxyCODONE-acetaminophen (PERCOCET/ROXICET) 5-325 MG  tablet Take 1 tablet by mouth every 4 (four) hours as needed.     phentermine (ADIPEX-P) 37.5 MG tablet Take half (18.'75mg'$ ) to one (37.'5mg'$ ) tablet by mouth 30 minutes before breakfast; limit caffeine.     rosuvastatin (CRESTOR) 10 MG tablet Take by mouth.     topiramate (TOPAMAX) 100 MG tablet Take 200 mg by mouth 2 (two) times daily.     traZODone (DESYREL) 100 MG tablet Take 100 mg by mouth at bedtime.     TRINTELLIX 10 MG TABS tablet Take 20 mg by mouth once.     VRAYLAR 3 MG capsule Take 3 mg by mouth daily.     VYVANSE 50 MG capsule Take 50 mg by mouth every morning.     No current facility-administered medications for this visit.    Review of Systems Review of Systems  Constitutional: Negative.   Gastrointestinal: Negative.   Genitourinary:  Positive for menstrual problem (cramps 7/10 last period) and pelvic pain.    Blood pressure 125/84, pulse 82, height '5\' 4"'$  (1.626 m), weight 98.4 kg, last menstrual period 05/02/2022.  Physical Exam Physical Exam Vitals and nursing note reviewed.  Exam conducted with a chaperone present.  Constitutional:      Appearance: Normal appearance. She is not ill-appearing.  HENT:     Head: Normocephalic and atraumatic.  Cardiovascular:     Rate and Rhythm: Normal rate.  Pulmonary:     Effort: Pulmonary effort is normal.  Abdominal:     General: Abdomen is flat.     Palpations: Abdomen is soft.  Genitourinary:    General: Normal vulva.     Exam position: Lithotomy position.     Vagina: Normal.     Cervix: Normal.     Uterus: Normal.      Adnexa: Right adnexa normal and left adnexa normal.  Musculoskeletal:        General: Normal range of motion.     Cervical back: Normal range of motion.  Skin:    General: Skin is warm and dry.  Neurological:     General: No focal deficit present.     Mental Status: She is alert.  Psychiatric:        Mood and Affect: Mood normal.        Behavior: Behavior normal.     Data Reviewed Pap 2020  negative  Assessment Menorrhagia with regular cycle - Plan: Cytology - PAP( [facility]), US PELVIC COMPLETE WITH TRANSVAGINAL, [facility], CBC  Tobacco dependency Urged to follow through with cessation regimen recommended by her PCP  Plan Orders Placed This Encounter  Procedures   US PELVIC COMPLETE WITH TRANSVAGINAL    Standing Status:   Future    Standing Expiration Date:   05/23/2023    Order Specific Question:   Reason for Exam (SYMPTOM  OR DIAGNOSIS REQUIRED)    Answer:   menorrhagia    Order Specific Question:   Preferred imaging location?    Answer:   [facility] Ultrasound   [facility]   CBC   F/u after testing is complete    Ellen Booker 05/23/2022, 8:53 AM

## 2022-05-23 NOTE — Progress Notes (Signed)
42 y.o New GYN referral presents for AUB/PAP.  C/o heavy bleeding, bled for 20 days in January, pain 7/10.  Changed pad every hour.

## 2022-05-24 LAB — CBC
Hematocrit: 38.8 % (ref 34.0–46.6)
Hemoglobin: 12.9 g/dL (ref 11.1–15.9)
MCH: 29.4 pg (ref 26.6–33.0)
MCHC: 33.2 g/dL (ref 31.5–35.7)
MCV: 88 fL (ref 79–97)
Platelets: 391 10*3/uL (ref 150–450)
RBC: 4.39 x10E6/uL (ref 3.77–5.28)
RDW: 11.8 % (ref 11.7–15.4)
WBC: 10.4 10*3/uL (ref 3.4–10.8)

## 2022-05-24 LAB — FOLLICLE STIMULATING HORMONE: FSH: 3.4 m[IU]/mL

## 2022-05-27 LAB — CYTOLOGY - PAP
Comment: NEGATIVE
Diagnosis: NEGATIVE
High risk HPV: NEGATIVE

## 2022-05-29 ENCOUNTER — Other Ambulatory Visit: Payer: Medicaid Other

## 2022-05-30 ENCOUNTER — Ambulatory Visit
Admission: RE | Admit: 2022-05-30 | Discharge: 2022-05-30 | Disposition: A | Discharge status: Home / Self Care | Payer: Medicaid Other | Source: Ambulatory Visit | Attending: Obstetrics & Gynecology | Admitting: Obstetrics & Gynecology

## 2022-05-30 DIAGNOSIS — N920 Excessive and frequent menstruation with regular cycle: Secondary | ICD-10-CM | POA: Diagnosis present

## 2022-06-05 ENCOUNTER — Encounter: Payer: Self-pay | Admitting: Gastroenterology

## 2022-06-05 ENCOUNTER — Ambulatory Visit: Payer: Medicaid Other | Admitting: Orthopedic Surgery

## 2022-06-06 ENCOUNTER — Encounter: Payer: Self-pay | Admitting: Orthopedic Surgery

## 2022-06-06 MED ORDER — TIZANIDINE HCL 4 MG PO TABS: 4.0000 mg | ORAL_TABLET | Freq: Four times a day (QID) | ORAL | 3 refills | Status: DC | PRN

## 2022-06-17 NOTE — Therapy (Signed)
OUTPATIENT PHYSICAL THERAPY EVALUATION   Patient Name: Ellen Booker MRN: [id number] DOB:Jul 23, 1980, 42 y.o., female Today's Date: 06/18/2022  END OF SESSION:  PT End of Session - 06/18/22 0932     Visit Number 1    Number of Visits 13    Date for PT Re-Evaluation 09/13/22    Authorization Type [place] [employer]    Authorization - Number of Visits 27    PT Start Time 0930    PT Stop Time 1013    PT Time Calculation (min) 43 min    Activity Tolerance Patient tolerated treatment well    Behavior During Therapy WFL for tasks assessed/performed             Past Medical History:  Diagnosis Date   Abdominal wall mass    left side   Anemia    Anxiety    Depression    History of Helicobacter pylori infection    per pt 2016   Renal cyst, right    per CT 04-04-2016   Vaginal Pap smear, abnormal    Wears glasses    Past Surgical History:  Procedure Laterality Date   back  08/2021   CESAREAN SECTION  2010   COLONOSCOPY  last one 06-19-2015   EXCISION ABDOMINAL WALL MASS  2012   right side   EXCISION MASS ABDOMINAL N/A 05/21/2016   Procedure: EXCISION ABDOMINAL WALL MASS;  Surgeon: Mickeal Skinner, MD;  Location: [facility];  Service: General;  Laterality: N/A;   RESECTION OF ABDOMINAL MASS     Patient Active Problem List   Diagnosis Date Noted   Dysmenorrhea 02/01/2014   Tobacco dependency 02/01/2014   Obesity 12/09/2013   Essential hypertension, benign 12/09/2013   Candidiasis of vulva and vagina 12/08/2013   Bilateral nipple discharge 11/08/2013   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   Unspecified disorder of menstruation and other abnormal bleeding from female genital tract 11/08/2013   Irregular menstrual cycle 11/08/2013   UTI (lower urinary tract infection) 01/29/2013   HSV (herpes simplex virus) infection 01/26/2013   Well woman exam with routine gynecological exam 01/26/2013   BMI 40.0-44.9, adult ([place]) 01/26/2013   Elevated BP  01/26/2013    REFERRING PROVIDER:  Lorane Gell, NP   REFERRING DIAG:  Diagnosis  Z98.1 (ICD-10-CM) - Arthrodesis status  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 I have been doing more with my significant other to move around since he had surgery. Once since Nov, my legs start to shake and then I lose balance, fell in the store- probably been walking about 30 min.   PERTINENT HISTORY:  Anxiety, h/o c-section  PAIN:  Are you having pain? Yes: NPRS scale: 4/10 Pain location: middle of lower back Pain description: sharp pain to the right lower back where it feels like a pull with a long stride Aggravating factors: long stride Relieving factors: pain meds, frequent seated rest breaks that are short  PRECAUTIONS: None  WEIGHT BEARING RESTRICTIONS: No  FALLS:  Has patient fallen in last 6 months? Yes. Number of falls 1  LIVING ENVIRONMENT: Lives with: lives with an adult companion Lives in: House/apartment Stairs: yes Has following equipment at home: Single point cane and Environmental consultant - 4 wheeled  OCCUPATION: not working  PLOF: Independent with basic ADLs  PATIENT GOALS: lift leg for more than a couple of seconds without shaking, regain strength, relieve sensation that back needs to pop   OBJECTIVE:   DIAGNOSTIC FINDINGS:  MRI 09/30/20: IMPRESSION: Status post L5-S1 decompression. A large right paracentral  and subarticular recess disc protrusion impinges on the right S1 root and deforms the right side of the thecal sac. There is also moderate right foraminal narrowing at this level.  Mild central canal and bilateral foraminal narrowing L4-5.  Xray lumbar completed 04/22/22.  PATIENT SURVEYS:  EVAL: LEFS: 21/80  COGNITIVE STATUS: Within functional limits for tasks assessed   POSTURE:  Eval: bil genu valgus and pronated through feet  GAIT: No AD at eval with slow cadence, short step length Comments: discussed trekking poles vs [facility]   Body Part #1 Lumbar   Lumbar ROM LUMBAR ROM:   Active  A/PROM  eval  Flexion Knee joint line  Extension   Right lateral flexion Joint line  Left lateral flexion Joint line  Right rotation   Left rotation    (Blank rows = not tested)       Body Part #2 Hip//knee   LE MMT LOWER EXTREMITY MMT:    MMT Right eval Left eval  Hip flexion 19.7 18.8  Hip extension    Hip abduction 28.5 21.5  Hip adduction    Hip internal rotation    Hip external rotation    Knee flexion 38.7 24.9  Knee extension 31.3 28.1   (Blank rows = not tested)     FUNCTIONAL TESTS:  30s chair stand: 5.5 wihtout UEs SLS: bil 2-3 s with ability to control balance correction without UEs   TREATMENT:                                                                                                                               DATE: 06/18/22  Seated hamstring stretch Seated hip hinge with dowel Sit<>stand  PATIENT EDUCATION:  Education details: Geophysicist/field seismologist of condition, POC, HEP, exercise form/rationale Person educated: Patient Education method: Explanation, Demonstration, Tactile cues, Verbal cues, and Handouts Education comprehension: verbalized understanding, returned demonstration, verbal cues required, tactile cues required, and needs further education  HOME EXERCISE PROGRAM: [id number]   ASSESSMENT:  CLINICAL IMPRESSION: Patient is a 42 y.o. F who  was seen today for physical therapy evaluation and treatment for LBP and chronic pain with fatigue limiting tolerance to ADLs and functional ambulation. Not planning to do aquatic therapy at this time as she does not have access to a pool for long-term exercise. Advised that if she were to obtain access while she is here, we can further incorporate aquatic rehab.    OBJECTIVE IMPAIRMENTS: Abnormal gait, decreased activity tolerance, decreased balance, difficulty walking, decreased strength, impaired perceived functional ability, increased muscle spasms, impaired flexibility, improper body mechanics, postural dysfunction, and pain.   ACTIVITY LIMITATIONS: carrying, lifting, bending, sitting, standing, squatting, stairs, transfers, bathing, dressing, locomotion level, and caring for others  PARTICIPATION LIMITATIONS: meal prep, cleaning, laundry, shopping, community activity, occupation, and yard work  PERSONAL FACTORS: Time since onset of injury/illness/exacerbation and anxiety, h/o c-section  are also affecting patient's functional outcome.   REHAB POTENTIAL: Good  CLINICAL DECISION MAKING: Evolving/moderate complexity  EVALUATION COMPLEXITY: Moderate   GOALS: Goals reviewed with patient? Yes  SHORT TERM GOALS: Target date: 4/19  Pt will be compliant with daily HEP performance Baseline: began establishing at eval Goal status: INITIAL  2.  Avoiding sustained seated positions greater than 45 min when able Baseline: reports pain with 1.5 hr of sitting and encouraged her to avoid greater than 45 min throughout the day Goal status: INITIAL   LONG TERM GOALS: Target date: POC date  LEFS to improve by MDC Baseline: 9 point [place] Goal status: INITIAL  2.  Able to walk through [employer] without feeling shaky Baseline: she feels like this is her most visited store as she orders groceries online Goal status: INITIAL  3.  Gross MMT to improve by 10% Baseline: see obj Goal status:  INITIAL  4.  Pt will verbalize reduction in sensation of back "needing to pop" Baseline: constant at eval Goal status: INITIAL    PLAN:  PT FREQUENCY: 1x/week  PT DURATION: 12 weeks  PLANNED INTERVENTIONS: Therapeutic exercises, Therapeutic activity, Neuromuscular re-education, Balance training, Gait training, Patient/Family education, Self Care, Joint mobilization, Stair training, Aquatic Therapy, Dry Needling, Spinal mobilization, Cryotherapy, Moist heat, Taping, Ionotophoresis 4mg /ml Dexamethasone, Manual therapy, and Re-evaluation.  PLAN FOR NEXT SESSION: STM to Rt superior glut max, CKC strengthening focus  Check all possible CPT codes: [id number] - PT Re-evaluation, 97110- Therapeutic Exercise, [phone]- Neuro Re-education, [phone] - Gait Training, [phone] - Manual Therapy, [phone] - Therapeutic Activities, [phone] - Self Care, [phone] - Iontophoresis, and [phone] - Aquatic therapy    Check all conditions that are expected to impact treatment: Musculoskeletal disorders   If treatment provided at initial evaluation, no treatment charged due to lack of authorization.      Alvia Tory C. Therman Hughlett PT, DPT 06/18/22 2:19 PM

## 2022-06-18 ENCOUNTER — Other Ambulatory Visit: Payer: Self-pay

## 2022-06-18 ENCOUNTER — Encounter (HOSPITAL_BASED_OUTPATIENT_CLINIC_OR_DEPARTMENT_OTHER): Payer: Self-pay | Admitting: Physical Therapy

## 2022-06-18 ENCOUNTER — Ambulatory Visit (HOSPITAL_BASED_OUTPATIENT_CLINIC_OR_DEPARTMENT_OTHER): Payer: Medicaid Other | Attending: Nurse Practitioner | Admitting: Physical Therapy

## 2022-06-18 DIAGNOSIS — Z981 Arthrodesis status: Secondary | ICD-10-CM | POA: Insufficient documentation

## 2022-06-18 DIAGNOSIS — M5459 Other low back pain: Secondary | ICD-10-CM | POA: Insufficient documentation

## 2022-06-18 DIAGNOSIS — M6281 Muscle weakness (generalized): Secondary | ICD-10-CM | POA: Diagnosis not present

## 2022-06-18 DIAGNOSIS — Z5189 Encounter for other specified aftercare: Secondary | ICD-10-CM | POA: Diagnosis not present

## 2022-06-18 DIAGNOSIS — R262 Difficulty in walking, not elsewhere classified: Secondary | ICD-10-CM | POA: Insufficient documentation

## 2022-06-18 DIAGNOSIS — M47816 Spondylosis without myelopathy or radiculopathy, lumbar region: Secondary | ICD-10-CM | POA: Diagnosis not present

## 2022-06-18 DIAGNOSIS — G894 Chronic pain syndrome: Secondary | ICD-10-CM | POA: Diagnosis not present

## 2022-06-26 ENCOUNTER — Ambulatory Visit (HOSPITAL_BASED_OUTPATIENT_CLINIC_OR_DEPARTMENT_OTHER): Payer: Medicaid Other | Attending: Nurse Practitioner

## 2022-06-26 ENCOUNTER — Telehealth: Payer: Self-pay | Admitting: Orthopedic Surgery

## 2022-06-26 ENCOUNTER — Encounter (HOSPITAL_BASED_OUTPATIENT_CLINIC_OR_DEPARTMENT_OTHER): Payer: Self-pay

## 2022-06-26 DIAGNOSIS — R262 Difficulty in walking, not elsewhere classified: Secondary | ICD-10-CM | POA: Diagnosis present

## 2022-06-26 DIAGNOSIS — M5459 Other low back pain: Secondary | ICD-10-CM | POA: Diagnosis present

## 2022-06-26 DIAGNOSIS — M6281 Muscle weakness (generalized): Secondary | ICD-10-CM | POA: Diagnosis not present

## 2022-06-26 NOTE — Telephone Encounter (Signed)
[employer] forms received. To [employer] 

## 2022-06-26 NOTE — Therapy (Signed)
OUTPATIENT PHYSICAL THERAPY EVALUATION   Patient Name: Ellen Booker MRN: [id number] DOB:June 17, 1980, 42 y.o., female Today's Date: 06/26/2022  END OF SESSION:  PT End of Session - 06/26/22 1131     Visit Number 2    Number of Visits 13    Date for PT Re-Evaluation 09/13/22    Authorization Type [place] [employer]    Authorization - Number of Visits 27    PT Start Time 1103    PT Stop Time 1145    PT Time Calculation (min) 42 min    Activity Tolerance Patient tolerated treatment well    Behavior During Therapy WFL for tasks assessed/performed              Past Medical History:  Diagnosis Date   Abdominal wall mass    left side   Anemia    Anxiety    Depression    History of Helicobacter pylori infection    per pt 2016   Renal cyst, right    per CT 04-04-2016   Vaginal Pap smear, abnormal    Wears glasses    Past Surgical History:  Procedure Laterality Date   back  08/2021   CESAREAN SECTION  2010   COLONOSCOPY  last one 06-19-2015   EXCISION ABDOMINAL WALL MASS  2012   right side   EXCISION MASS ABDOMINAL N/A 05/21/2016   Procedure: EXCISION ABDOMINAL WALL MASS;  Surgeon: Mickeal Skinner, MD;  Location: [facility];  Service: General;  Laterality: N/A;   RESECTION OF ABDOMINAL MASS     Patient Active Problem List   Diagnosis Date Noted   Dysmenorrhea 02/01/2014   Tobacco dependency 02/01/2014   Obesity 12/09/2013   Essential hypertension, benign 12/09/2013   Candidiasis of vulva and vagina 12/08/2013   Bilateral nipple discharge 11/08/2013   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   Unspecified disorder of menstruation and other abnormal bleeding from female genital tract 11/08/2013   Irregular menstrual cycle 11/08/2013   UTI (lower urinary tract infection) 01/29/2013   HSV (herpes simplex virus) infection 01/26/2013   Well woman exam with routine gynecological exam 01/26/2013   BMI 40.0-44.9, adult 01/26/2013   Elevated BP 01/26/2013     REFERRING PROVIDER:  Lorane Gell, NP   REFERRING DIAG:  Diagnosis  Z98.1 (ICD-10-CM) - Arthrodesis status  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 Has only been partially compliant with HEP. Arrives with [facility].   PERTINENT HISTORY:  Anxiety, h/o c-section  PAIN:  Are you having pain? Yes: NPRS scale: 4/10 Pain location: middle of lower back Pain description: sharp pain to the right lower back where it feels like a pull with a long stride Aggravating factors: long stride Relieving factors: pain meds, frequent seated rest breaks that are short  PRECAUTIONS: None  WEIGHT BEARING RESTRICTIONS: No  FALLS:  Has patient fallen in last 6 months? Yes. Number of falls 1  LIVING ENVIRONMENT: Lives with: lives with an adult companion Lives in: House/apartment Stairs: yes Has following equipment at home: Single point cane and Environmental consultant - 4 wheeled  OCCUPATION: not working  PLOF: Independent with basic ADLs  PATIENT GOALS: lift leg for more than a couple of seconds without shaking, regain strength, relieve sensation that back needs to pop   OBJECTIVE:   DIAGNOSTIC FINDINGS:  MRI 09/30/20: IMPRESSION: Status post L5-S1 decompression. A large right paracentral and subarticular recess disc protrusion impinges on the right S1 root and deforms the right side of the thecal sac. There is also moderate right foraminal  narrowing at this level.  Mild central canal and bilateral foraminal narrowing L4-5.  Xray lumbar completed 04/22/22.  PATIENT SURVEYS:  EVAL: LEFS: 21/80  COGNITIVE STATUS: Within functional limits for tasks assessed   POSTURE:  Eval: bil genu valgus and pronated through feet  GAIT: No AD at eval with slow cadence, short step length Comments: discussed trekking poles vs [facility]   Body Part #1 Lumbar   Lumbar ROM LUMBAR ROM:   Active  A/PROM  eval  Flexion Knee joint line  Extension   Right lateral flexion Joint line  Left lateral flexion Joint line  Right rotation   Left rotation    (Blank rows = not tested)       Body Part #2 Hip//knee   LE MMT LOWER EXTREMITY MMT:    MMT Right eval Left eval  Hip flexion 19.7 18.8  Hip extension    Hip abduction 28.5 21.5  Hip adduction    Hip internal rotation    Hip external rotation    Knee flexion 38.7 24.9  Knee extension 31.3 28.1   (Blank rows = not tested)     FUNCTIONAL TESTS:  30s chair stand: 5.5 wihtout UEs SLS: bil 2-3 s with ability to control balance correction without UEs   TREATMENT:                                                                                                                               DATE: 06/26/22  Manual piriformis and fig 4 stretch bil STM L and R lumbar p/s and glutes (sideying and prone) LTR x5 ea LTR with UTR x5ea Hooklying PPT x10 Sidleying clams 2x10ea    DATE: 06/18/22  Seated hamstring stretch  Seated hip hinge with dowel Sit<>stand     PATIENT EDUCATION:  Education details: Anatomy of condition, POC, HEP, exercise form/rationale Person educated: Patient Education method: Explanation, Demonstration, Tactile cues, Verbal cues, and Handouts Education comprehension: verbalized understanding, returned demonstration, verbal cues required, tactile cues required, and needs further education  HOME EXERCISE PROGRAM: [id number]   ASSESSMENT:  CLINICAL  IMPRESSION: Pt presents with significant muscle guarding. Poor lumbopelvic dissociation noted so performed LTR within pain limitations to help with this. Pt experiences frequent feeling of cramping throughout low back and HS. Will continue to monitor this. Decreased intensity with hooklying PPT due to incidence of LBP. Overall good tolerance to very gentle manual stretching. Unable to perform [facility] independently due to tightness/pain. No complaints with sidleying clam shells on LLE but did have some discomfort on R. Spent time educated pt on HEP performance and PT goals. Will monitor soreness level next session and progress as able.    OBJECTIVE IMPAIRMENTS: Abnormal gait, decreased activity tolerance, decreased balance, difficulty walking, decreased strength, impaired perceived functional ability, increased muscle spasms, impaired flexibility, improper body mechanics, postural dysfunction, and pain.   ACTIVITY LIMITATIONS: carrying, lifting, bending, sitting, standing, squatting, stairs, transfers, bathing, dressing, locomotion level, and caring for others  PARTICIPATION LIMITATIONS: meal prep, cleaning, laundry, shopping, community activity, occupation, and yard work  PERSONAL FACTORS: Time since onset of injury/illness/exacerbation and anxiety, h/o c-section  are also affecting patient's functional outcome.   REHAB POTENTIAL: Good  CLINICAL DECISION MAKING: Evolving/moderate complexity  EVALUATION COMPLEXITY: Moderate   GOALS: Goals reviewed with patient? Yes  SHORT TERM GOALS: Target date: 4/19  Pt will be compliant with daily HEP performance Baseline: began establishing at eval Goal status: INITIAL  2.  Avoiding sustained seated positions greater than 45 min when able Baseline: reports pain with 1.5 hr of sitting and encouraged her to avoid greater than 45 min throughout the day Goal status: INITIAL   LONG TERM GOALS: Target date: POC date  LEFS to improve by MDC Baseline: 9  point [place] Goal status: INITIAL  2.  Able to walk through [employer] without feeling shaky Baseline: she feels like this is her most visited store as she orders groceries online Goal status: INITIAL  3.  Gross MMT to improve by 10% Baseline: see obj Goal status: INITIAL  4.  Pt will verbalize reduction in sensation of back "needing to pop" Baseline: constant at eval Goal status: INITIAL    PLAN:  PT FREQUENCY: 1x/week  PT DURATION: 12 weeks  PLANNED INTERVENTIONS: Therapeutic exercises, Therapeutic activity, Neuromuscular re-education, Balance training, Gait training, Patient/Family education, Self Care, Joint mobilization, Stair training, Aquatic Therapy, Dry Needling, Spinal mobilization, Cryotherapy, Moist heat, Taping, Ionotophoresis 4mg /ml Dexamethasone, Manual therapy, and Re-evaluation.  PLAN FOR NEXT SESSION: STM to Rt superior glut max, CKC strengthening focus  Check all possible CPT codes: [id number] - PT Re-evaluation, 97110- Therapeutic Exercise, [phone]- Neuro Re-education, [phone] - Gait Training, [phone] - Manual Therapy, [phone] - Therapeutic Activities, [phone] - Self Care, [phone] - Iontophoresis, and [phone] - Aquatic therapy    Check all conditions that are expected to impact treatment: Musculoskeletal disorders   If treatment provided at initial evaluation, no treatment charged due to lack of authorization.      Sherlynn Carbon, PTA  06/26/22 11:56 AM

## 2022-07-01 NOTE — Telephone Encounter (Signed)
Waiting on forms

## 2022-07-04 ENCOUNTER — Encounter: Payer: Self-pay | Admitting: Obstetrics & Gynecology

## 2022-07-04 ENCOUNTER — Ambulatory Visit (HOSPITAL_BASED_OUTPATIENT_CLINIC_OR_DEPARTMENT_OTHER): Payer: Medicaid Other

## 2022-07-04 ENCOUNTER — Encounter: Payer: Self-pay | Admitting: Orthopedic Surgery

## 2022-07-04 ENCOUNTER — Encounter (HOSPITAL_BASED_OUTPATIENT_CLINIC_OR_DEPARTMENT_OTHER): Payer: Self-pay

## 2022-07-04 DIAGNOSIS — R262 Difficulty in walking, not elsewhere classified: Secondary | ICD-10-CM

## 2022-07-04 DIAGNOSIS — M5459 Other low back pain: Secondary | ICD-10-CM

## 2022-07-04 DIAGNOSIS — M6281 Muscle weakness (generalized): Secondary | ICD-10-CM

## 2022-07-04 NOTE — Therapy (Signed)
OUTPATIENT PHYSICAL THERAPY EVALUATION   Patient Name: Ellen Booker MRN: [id number] DOB:13-Oct-1980, 42 y.o., female Today's Date: 07/04/2022  END OF SESSION:  PT End of Session - 07/04/22 1000     Visit Number 3    Number of Visits 13    Date for PT Re-Evaluation 09/13/22    Authorization Type [employer]    Authorization - Number of Visits 27    PT Start Time 0903    PT Stop Time 0930    PT Time Calculation (min) 27 min    Activity Tolerance Patient tolerated treatment well    Behavior During Therapy WFL for tasks assessed/performed              Past Medical History:  Diagnosis Date   Abdominal wall mass    left side   Anemia    Anxiety    Depression    History of Helicobacter pylori infection    per pt 2016   Renal cyst, right    per CT 04-04-2016   Vaginal Pap smear, abnormal    Wears glasses    Past Surgical History:  Procedure Laterality Date   back  08/2021   CESAREAN SECTION  2010   COLONOSCOPY  last one 06-19-2015   EXCISION ABDOMINAL WALL MASS  2012   right side   EXCISION MASS ABDOMINAL N/A 05/21/2016   Procedure: EXCISION ABDOMINAL WALL MASS;  Surgeon: Rodman PickleLuke Aaron Kinsinger, MD;  [facility] [place];  Service: General;  Laterality: N/A;   RESECTION OF ABDOMINAL MASS     Patient Active Problem List   Diagnosis Date Noted   Dysmenorrhea 02/01/2014   Tobacco dependency 02/01/2014   Obesity 12/09/2013   Essential hypertension, benign 12/09/2013   Candidiasis of vulva and vagina 12/08/2013   Bilateral nipple discharge 11/08/2013   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   Unspecified disorder of menstruation and other abnormal bleeding from female genital tract 11/08/2013   Irregular menstrual cycle 11/08/2013   UTI (lower urinary tract infection) 01/29/2013   HSV (herpes simplex virus) infection 01/26/2013   Well woman exam with routine gynecological exam 01/26/2013   BMI 40.0-44.9, adult 01/26/2013   Elevated BP  01/26/2013    REFERRING PROVIDER:  Jonetta Speakruhe, Shelly L, NP   REFERRING DIAG:  Diagnosis  Z98.1 (ICD-10-CM) - Arthrodesis status  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 L hurts also, but R is worse. L sided LBP has resolved from last visit but R side really bothers her. Pt reports she is signing up for a membership at the aquatic center.   PERTINENT HISTORY:  Anxiety, h/o c-section  PAIN:  Are you having pain? Yes: NPRS scale: 4/10 Pain location: middle of lower back Pain description: sharp pain to the right lower back where it feels like a pull with a long stride Aggravating factors: long stride Relieving factors: pain meds, frequent seated rest breaks that are short  PRECAUTIONS: None  WEIGHT BEARING RESTRICTIONS: No  FALLS:  Has patient fallen in last 6 months? Yes. Number of falls 1  LIVING ENVIRONMENT: Lives with: lives with an adult companion Lives in: House/apartment Stairs: yes Has following equipment at home: Single point cane and Environmental consultant - 4 wheeled  OCCUPATION: not working  PLOF: Independent with basic ADLs  PATIENT GOALS: lift leg for more than a couple of seconds without shaking, regain strength, relieve sensation that back needs to pop   OBJECTIVE:   DIAGNOSTIC FINDINGS:  MRI 09/30/20: IMPRESSION: Status post L5-S1 decompression. A large right paracentral and subarticular recess disc protrusion  impinges on the right S1 root and deforms the right side of the thecal sac. There is also moderate right foraminal narrowing at this level.  Mild central canal and bilateral foraminal narrowing L4-5.  Xray lumbar completed 04/22/22.  PATIENT SURVEYS:  EVAL: LEFS: 21/80  COGNITIVE STATUS: Within functional limits for tasks assessed   POSTURE:  Eval: bil genu valgus and pronated through feet  GAIT: No AD at eval with slow cadence, short step length Comments: discussed trekking poles vs [facility]   Body Part #1 Lumbar   Lumbar ROM LUMBAR ROM:   Active  A/PROM  eval  Flexion Knee joint line  Extension   Right lateral flexion Joint line  Left lateral flexion Joint line  Right rotation   Left rotation    (Blank rows = not tested)       Body Part #2 Hip//knee   LE MMT LOWER EXTREMITY MMT:    MMT Right eval Left eval  Hip flexion 19.7 18.8  Hip extension    Hip abduction 28.5 21.5  Hip adduction    Hip internal rotation    Hip external rotation    Knee flexion 38.7 24.9  Knee extension 31.3 28.1   (Blank rows = not tested)     FUNCTIONAL TESTS:  30s chair stand: 5.5 wihtout UEs SLS: bil 2-3 s with ability to control balance correction without UEs   TREATMENT:                                                                                                                               DATE: 07/04/22  Manual piriformis and fig 4 stretch bil Manual HSS R   STM L and R  lumbar p/s and glutes in sidelying Quadruped cat/cow x10ea   DATE: 06/26/22  Manual piriformis and fig 4 stretch bil STM L and R lumbar p/s and glutes (sideying and prone) LTR x5 ea LTR with UTR x5ea Hooklying PPT x10 Sidleying clams 2x10ea    DATE: 06/18/22  Seated hamstring stretch Seated hip hinge with dowel Sit<>stand     PATIENT EDUCATION:  Education details: Anatomy of condition, POC, HEP, exercise form/rationale Person educated: Patient Education method: Explanation,  Demonstration, Tactile cues, Verbal cues, and Handouts Education comprehension: verbalized understanding, returned demonstration, verbal cues required, tactile cues required, and needs further education  HOME EXERCISE PROGRAM: [id number]   ASSESSMENT:  CLINICAL IMPRESSION: Tx time was limited due to late arrival. Worked on stretching and manual interventions to address soft tissue restrcitions in R hip and lumbr paraspinals. She remains tender throuhgout these areas. Reported mild relief from cat/cow exercises and educated pt how to perform this at home. Plan to trial 2 aquatic sessions to hopefully decrease muscle guarding and improve movement quality.    OBJECTIVE IMPAIRMENTS: Abnormal gait, decreased activity tolerance, decreased balance, difficulty walking, decreased strength, impaired perceived functional ability, increased muscle spasms, impaired flexibility, improper body mechanics, postural dysfunction, and pain.   ACTIVITY LIMITATIONS: carrying, lifting, bending, sitting, standing, squatting, stairs, transfers, bathing, dressing, locomotion level, and caring for others  PARTICIPATION LIMITATIONS: meal prep, cleaning, laundry, shopping, community activity, occupation, and yard work  PERSONAL FACTORS: Time since onset of injury/illness/exacerbation and anxiety, h/o c-section  are also affecting patient's functional outcome.   REHAB POTENTIAL: Good  CLINICAL DECISION MAKING: Evolving/moderate complexity  EVALUATION COMPLEXITY: Moderate   GOALS: Goals reviewed with patient? Yes  SHORT TERM GOALS: Target date: 4/19  Pt will be compliant with daily HEP performance Baseline: began establishing at eval Goal status: IN PROGRESS  2.  Avoiding sustained seated positions greater than 45 min when able Baseline: reports pain with 1.5 hr of sitting and encouraged her to avoid greater than 45 min throughout the day Goal status: INITIAL   LONG TERM GOALS: Target date: POC date  LEFS  to improve by MDC Baseline: 9 point MDC Goal status: INITIAL  2.  Able to walk through [employer] without feeling shaky Baseline: she feels like this is her most visited store as she orders groceries online Goal status: INITIAL  3.  Gross MMT to improve by 10% Baseline: see obj Goal status: INITIAL  4.  Pt will verbalize reduction in sensation of back "needing to pop" Baseline: constant at eval Goal status: INITIAL    PLAN:  PT FREQUENCY: 1x/week  PT DURATION: 12 weeks  PLANNED INTERVENTIONS: Therapeutic exercises, Therapeutic activity, Neuromuscular re-education, Balance training, Gait training, Patient/Family education, Self Care, Joint mobilization, Stair training, Aquatic Therapy, Dry Needling, Spinal mobilization, Cryotherapy, Moist heat, Taping, Ionotophoresis 4mg /ml Dexamethasone, Manual therapy, and Re-evaluation.  PLAN FOR NEXT SESSION: STM to Rt superior glut max, CKC strengthening focus  Check all possible CPT codes: 38466 - PT Re-evaluation, 97110- Therapeutic Exercise, [phone]- Neuro Re-education, [phone] - Gait Training, [phone] - Manual Therapy, [phone] - Therapeutic Activities, [phone] - Self Care, [phone] - Iontophoresis, and [phone] - Aquatic therapy    Check all conditions that are expected to impact treatment: Musculoskeletal disorders   If treatment provided at initial evaluation, no treatment charged due to lack of authorization.      Riki Altes, PTA  07/04/22 12:40 PM

## 2022-07-05 ENCOUNTER — Encounter: Payer: Self-pay | Admitting: Orthopedic Surgery

## 2022-07-05 ENCOUNTER — Telehealth: Payer: Self-pay | Admitting: Orthopedic Surgery

## 2022-07-05 DIAGNOSIS — M960 Pseudarthrosis after fusion or arthrodesis: Secondary | ICD-10-CM

## 2022-07-05 DIAGNOSIS — M545 Low back pain, unspecified: Secondary | ICD-10-CM

## 2022-07-05 NOTE — Telephone Encounter (Signed)
Orthopedic Note  Patient has had persistent low back pain. She has tried multiple conservative treatments including pain management without any relief. She has been taking percocet but does not want to continue to take these or get addicted. Her most recent surgery was in June of 2023. I will order a CT scan of her lumbar spine to evaluate for pseudarthrosis. If she has a pseudarthrosis, she may get some benefit from a pseudarthrosis repair, but I am not sure it would help all of her pain. She has stopped using nicotine so I would offer repair if there is a pseudarthrosis. I talked about spinal cord stimulator as an option for her if there is not a pseudarthrosis. I reiterated that she needs to follow up with neurology for her leg shaking. I do not have an explanation for that from her lumbar spine.  London Sheer, MD Orthopedic Surgeon

## 2022-07-05 NOTE — Addendum Note (Signed)
Addended by: Willia Craze on: 07/05/2022 11:34 AM   Modules accepted: Orders

## 2022-07-08 MED ORDER — TIZANIDINE HCL 4 MG PO TABS: 4.0000 mg | ORAL_TABLET | Freq: Four times a day (QID) | ORAL | 3 refills | Status: DC | PRN

## 2022-07-09 ENCOUNTER — Telehealth: Payer: Self-pay | Admitting: Orthopedic Surgery

## 2022-07-09 NOTE — Telephone Encounter (Signed)
STD forms ([employer]) have been received. Please advise the duration of oow. Thanks

## 2022-07-10 ENCOUNTER — Ambulatory Visit: Payer: Medicaid Other | Admitting: Obstetrics & Gynecology

## 2022-07-11 ENCOUNTER — Ambulatory Visit (INDEPENDENT_AMBULATORY_CARE_PROVIDER_SITE_OTHER): Payer: Medicaid Other | Admitting: Physical Medicine and Rehabilitation

## 2022-07-11 ENCOUNTER — Encounter: Payer: Self-pay | Admitting: Physical Medicine and Rehabilitation

## 2022-07-11 DIAGNOSIS — M545 Low back pain, unspecified: Secondary | ICD-10-CM

## 2022-07-11 DIAGNOSIS — M47816 Spondylosis without myelopathy or radiculopathy, lumbar region: Secondary | ICD-10-CM

## 2022-07-11 DIAGNOSIS — G894 Chronic pain syndrome: Secondary | ICD-10-CM | POA: Diagnosis not present

## 2022-07-11 DIAGNOSIS — M961 Postlaminectomy syndrome, not elsewhere classified: Secondary | ICD-10-CM | POA: Diagnosis not present

## 2022-07-11 DIAGNOSIS — G8929 Other chronic pain: Secondary | ICD-10-CM

## 2022-07-11 NOTE — Progress Notes (Signed)
Functional Pain Scale - descriptive words and definitions  Distressing (6)    Pain is present/unable to complete most ADLs limited by pain/sleep is difficult and active distraction is only marginal. Moderate range order  Average Pain 4  Lower back pain with no radiation. Spinal cord stimulator consult

## 2022-07-11 NOTE — Telephone Encounter (Signed)
Out of work x 8 weeks for now and we will re-eval at that time

## 2022-07-11 NOTE — Telephone Encounter (Signed)
Relayed to [employer].

## 2022-07-11 NOTE — Progress Notes (Signed)
Ellen Booker - 42 y.o. female MRN [id number]  Date of birth: 04/06/1980  Office Visit Note: Visit Date: 07/11/2022 PCP: Nathaneil Canary, PA-C Referred by: London Sheer, MD  Subjective: Chief Complaint  Patient presents with   Lower Back - Pain   HPI: Ellen Booker is a 42 y.o. female who comes in today per the request of Dr. Willia Craze for evaluation of chronic, worsening and severe bilateral lower back pain, right greater than left. Patient is being seen in our office today to discuss possible spinal cord stimulator placement. Pain ongoing for several years, worsens with prolonged sitting, standing and walking. She describes her pain as deep aching sensation, currently rates as 5 out of 10. Some relief of pain with home exercise regimen, rest and use of medications. Previously treated at [facility] for chronic pain. Currently taking Percocet prescribed by Dr. Randalyn Rhea, per PDMP last prescription filled in January of 2024. Patient states she is no longer patient at this practice as she does use marijuana regularly. She is currently undergoing formal physical therapy at [facility], no relief of pain with these treatments. She does have complex surgical history, underwent L5-S1 microdiscectomy in February of 2022, L5-S1 hemilaminectomy in September of 2022 and L5-S1 ALIF by Dr. Rockne Menghini with [facility]. She reports complete resolution of radicular symptoms, however reports bilateral leg weakness, tremors and instability post injection. She does ambulate with cane. Patient was recently evaluated by our spine surgeon Dr. Willia Craze. He has plans to obtain CT of lumbar spine after June 3rd as this would be 1 year out from surgery, per his notes he has concern for possible pseudoarthrosis which would possibly require surgery. Dr. Christell Constant also referred patient to neurology at [facility] to address issues with bilateral leg tremors. Patient denies recent  trauma or fall.         Review of Systems  Musculoskeletal:  Positive for back pain.  Neurological:  Positive for tremors and focal weakness.  All other systems reviewed and are negative.  Otherwise per HPI.  Assessment & Plan: Visit Diagnoses:    ICD-10-CM   1. Chronic bilateral low back pain without sciatica  M54.50    G89.29     2. Spondylosis without myelopathy or radiculopathy, lumbar region  M47.816     3. Post laminectomy syndrome  M96.1     4. Chronic pain syndrome  G89.4        Plan: Findings:  Chronic, recalcitrant, worsening and severe bilateral lower back pain, right greater than left.  Patient continues to have severe pain despite good conservative therapy such as formal physical therapy, home exercise regimen, rest and use of medications.  She is also failed nonconservative treatment including surgery.  Patient's clinical presentation and exam are partially consistent with facet mediated pain, however no relief of discomfort with prior facet joint injections. Patient voiced she is not interested in continuing chronic pain management.  She seems to be good candidate for spinal cord stimulator placement with history of chronic pain, multiple lumbar surgeries and failed conservative therapies. Dr. Alvester Morin and myself explained spinal cord stimulator process in detail today including need for neuropsychology evaluation prior to trial. Patient would like to move forward with CT imaging in June, she will then decide if she would like to proceed with stimulator trial. I will arrange for [facility] with [facility] to call patient to answer any further questions. Patient is scheduled for consult with neurology at  [facility] in the coming weeks. I instructed patient to contact us with any questions. No new red flag symptoms noted upon exam today.     Meds & Orders: No orders of the defined types were placed in this encounter.  No orders of the defined types were placed  in this encounter.   Follow-up: Return if symptoms worsen or fail to improve.   Procedures: No procedures performed      Clinical History: IMPRESSION: Large disc extrusion on the right at L5-S1.  Electronically Signed by: Orlean Patten on 06/27/2021 10:29 AM Narrative  MRI lumbar spine without and with contrast:  INDICATION: Low back pain radiating down the right leg.  TECHNIQUE: Sagittal and axial T1 and T2-weighted sequences were performed. Additional sagittal STIR images were performed. Additional sagittal and axial T1-weighted sequences were performed after intravenous injection 9 mL Gadavist.  COMPARISON: MRI lumbar spine 09/30/2020  FINDINGS: #  Osseous structures: No compression fracture.  No abnormal enhancement. #  Alignment: Normal. #  Modic changes: Type I Modic changes in the endplates adjacent to the L5-S1 disc. #  Conus medullaris: Normal. Terminates at L1 with no evidence of tethering. No abnormal enhancement. #  Lower thoracic segments: No significant abnormality.  #  L1-2: Normal  #  L2-3: Normal  #  L3-4: Normal  #  L4-5: Mild degenerative disc disease with minimal bulging of the disc. Moderate facet joint arthritis with facet joint effusions.. No spinal or foraminal stenosis.  #  L5-S1: Large disc extrusion on the right with compression of the right side of the thecal sac and the right S1 nerve root. Prior laminectomy. There is some enhancing granulation tissue around the thecal sac as well as around the disc extrusion. Procedure Note  Bettey Mare, MD - 06/27/2021 Formatting of this note might be different from the original. MRI lumbar spine without and with contrast:  INDICATION: Low back pain radiating down the right leg.  TECHNIQUE: Sagittal and axial T1 and T2-weighted sequences were performed. Additional sagittal STIR images were performed. Additional sagittal and axial T1-weighted sequences were performed after intravenous injection 9 mL  Gadavist.  COMPARISON: MRI lumbar spine 09/30/2020  FINDINGS: #  Osseous structures: No compression fracture.  No abnormal enhancement. #  Alignment: Normal. #  Modic changes: Type I Modic changes in the endplates adjacent to the L5-S1 disc. #  Conus medullaris: Normal. Terminates at L1 with no evidence of tethering. No abnormal enhancement. #  Lower thoracic segments: No significant abnormality.  #  L1-2: Normal  #  L2-3: Normal  #  L3-4: Normal  #  L4-5: Mild degenerative disc disease with minimal bulging of the disc. Moderate facet joint arthritis with facet joint effusions.. No spinal or foraminal stenosis.  #  L5-S1: Large disc extrusion on the right with compression of the right side of the thecal sac and the right S1 nerve root. Prior laminectomy. There is some enhancing granulation tissue around the thecal sac as well as around the disc extrusion.   IMPRESSION: Large disc extrusion on the right at L5-S1.  Electronically Signed by: Orlean Patten on 06/27/2021 10:29 AM   She reports that she has been smoking cigarettes. She has a 9.50 pack-year smoking history. She has never used smokeless tobacco. No results for input(s): "HGBA1C", "LABURIC" in the last 8760 hours.  Objective:  VS:  HT:    WT:   BMI:     BP:   HR: bpm  TEMP: ( )  RESP:  Physical Exam Vitals and nursing note reviewed.  HENT:     Head: Normocephalic and atraumatic.     Right Ear: External ear normal.     Left Ear: External ear normal.     Nose: Nose normal.     Mouth/Throat:     Mouth: Mucous membranes are moist.  Eyes:     Extraocular Movements: Extraocular movements intact.  Cardiovascular:     Rate and Rhythm: Normal rate.     Pulses: Normal pulses.  Pulmonary:     Effort: Pulmonary effort is normal.  Abdominal:     General: Abdomen is flat. There is no distension.  Musculoskeletal:        General: Tenderness present.     Cervical back: Normal range of motion.     Comments: Patient rises  from seated position to standing without difficulty. Good lumbar range of motion. No pain noted with facet loading. 5/5 strength noted with bilateral hip flexion, knee flexion/extension, ankle dorsiflexion/plantarflexion and 4/5 EHL. No clonus noted bilaterally. No pain upon palpation of greater trochanters. No pain with internal/external rotation of bilateral hips. Sensation intact bilaterally. Negative slump test bilaterally. Ambulates without aid, gait steady.     Skin:    General: Skin is warm and dry.     Capillary Refill: Capillary refill takes less than 2 seconds.  Neurological:     Mental Status: She is alert and oriented to person, place, and time.     Gait: Gait abnormal.  Psychiatric:        Mood and Affect: Mood normal.        Behavior: Behavior normal.     Ortho Exam  Imaging: No results found.  Past Medical/Family/Surgical/Social History: Medications & Allergies reviewed per EMR, new medications updated. Patient Active Problem List   Diagnosis Date Noted   Dysmenorrhea 02/01/2014   Tobacco dependency 02/01/2014   Obesity 12/09/2013   Essential hypertension, benign 12/09/2013   Candidiasis of vulva and vagina 12/08/2013   Bilateral nipple discharge 11/08/2013   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   Unspecified disorder of menstruation and other abnormal bleeding from female genital tract 11/08/2013   Irregular menstrual cycle 11/08/2013   UTI (lower urinary tract infection) 01/29/2013   HSV (herpes simplex virus) infection 01/26/2013   Well woman exam with routine gynecological exam 01/26/2013   BMI 40.0-44.9, adult 01/26/2013   Elevated BP 01/26/2013   Past Medical History:  Diagnosis Date   Abdominal wall mass    left side   Anemia    Anxiety    Depression    History of Helicobacter pylori infection    per pt 2016   Renal cyst, right    per CT 04-04-2016   Vaginal Pap smear, abnormal    Wears glasses    Family History  Problem Relation Age of Onset    HIV Father    Cancer Maternal Grandmother    Uterine cancer Other    Past Surgical History:  Procedure Laterality Date   back  08/2021   CESAREAN SECTION  2010   COLONOSCOPY  last one 06-19-2015   EXCISION ABDOMINAL WALL MASS  2012   right side   EXCISION MASS ABDOMINAL N/A 05/21/2016   Procedure: EXCISION ABDOMINAL WALL MASS;  Surgeon: De Blanch Kinsinger, MD;  Location: [facility];  Service: General;  Laterality: N/A;   RESECTION OF ABDOMINAL MASS     Social History   Occupational History   Not on file  Tobacco Use   Smoking  status: Every Day    Packs/day: 0.50    Years: 19.00    Additional pack years: 0.00    Total pack years: 9.50    Types: Cigarettes   Smokeless tobacco: Never  Vaping Use   Vaping Use: Never used  Substance and Sexual Activity   Alcohol use: No    Comment: occ   Drug use: Yes    Types: Marijuana   Sexual activity: Yes    Partners: Male    Birth control/protection: None

## 2022-07-12 ENCOUNTER — Encounter (HOSPITAL_BASED_OUTPATIENT_CLINIC_OR_DEPARTMENT_OTHER): Payer: Self-pay | Admitting: Physical Therapy

## 2022-07-12 ENCOUNTER — Ambulatory Visit (HOSPITAL_BASED_OUTPATIENT_CLINIC_OR_DEPARTMENT_OTHER): Payer: Medicaid Other | Admitting: Physical Therapy

## 2022-07-12 DIAGNOSIS — M5459 Other low back pain: Secondary | ICD-10-CM

## 2022-07-12 DIAGNOSIS — M6281 Muscle weakness (generalized): Secondary | ICD-10-CM

## 2022-07-12 DIAGNOSIS — R262 Difficulty in walking, not elsewhere classified: Secondary | ICD-10-CM

## 2022-07-12 NOTE — Therapy (Signed)
OUTPATIENT PHYSICAL THERAPY EVALUATION   Patient Name: Ellen Booker MRN: [id number] DOB:1981-02-09, 42 y.o., female Today's Date: 07/12/2022  END OF SESSION:  PT End of Session - 07/12/22 0912     Visit Number 4    Number of Visits 13    Date for PT Re-Evaluation 09/13/22    Authorization Type [place] [employer]    Authorization - Number of Visits 27    PT Start Time [phone]   pt arrived late to pool area   PT Stop Time 0945    PT Time Calculation (min) 40 min    Activity Tolerance Patient tolerated treatment well    Behavior During Therapy [facility] for tasks assessed/performed              Past Medical History:  Diagnosis Date   Abdominal wall mass    left side   Anemia    Anxiety    Depression    History of Helicobacter pylori infection    per pt 2016   Renal cyst, right    per CT 04-04-2016   Vaginal Pap smear, abnormal    Wears glasses    Past Surgical History:  Procedure Laterality Date   back  08/2021   CESAREAN SECTION  2010   COLONOSCOPY  last one 06-19-2015   EXCISION ABDOMINAL WALL MASS  2012   right side   EXCISION MASS ABDOMINAL N/A 05/21/2016   Procedure: EXCISION ABDOMINAL WALL MASS;  Surgeon: Rodman Pickle, MD;  Location: [facility] ;  Service: General;  Laterality: N/A;   RESECTION OF ABDOMINAL MASS     Patient Active Problem List   Diagnosis Date Noted   Dysmenorrhea 02/01/2014   Tobacco dependency 02/01/2014   Obesity 12/09/2013   Essential hypertension, benign 12/09/2013   Candidiasis of vulva and vagina 12/08/2013   Bilateral nipple discharge 11/08/2013   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   Unspecified disorder of menstruation and other abnormal bleeding from female genital tract 11/08/2013   Irregular menstrual cycle 11/08/2013   UTI (lower urinary tract infection) 01/29/2013   HSV (herpes simplex virus) infection 01/26/2013   Well woman exam with routine gynecological exam 01/26/2013   BMI 40.0-44.9, adult  01/26/2013   Elevated BP 01/26/2013    REFERRING PROVIDER:  Jonetta Speak, NP   REFERRING DIAG:  Diagnosis  Z98.1 (ICD-10-CM) - Arthrodesis status  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   She reports she only gets relief from her pain when laying on her Rt side.  She plans to use her mother-in-law's pool more frequently this summer.   PERTINENT HISTORY:  Anxiety, h/o c-section  PAIN:  Are you having pain? Yes: NPRS scale: 4/10 Pain location: middle of lower back Pain description: sharp pain to the right lower back where it feels like a pull with a long stride Aggravating factors: long stride Relieving factors: pain meds, frequent seated rest breaks that are short  PRECAUTIONS: None  WEIGHT BEARING RESTRICTIONS: No  FALLS:  Has patient fallen in last 6 months? Yes. Number of falls 1  LIVING ENVIRONMENT: Lives with: lives with an adult companion Lives in: House/apartment Stairs: yes Has following equipment at home: Single point cane and Environmental consultant - 4 wheeled  OCCUPATION: not working  PLOF: Independent with basic ADLs  PATIENT GOALS: lift leg for more than a couple of seconds without shaking, regain strength, relieve sensation that back needs to pop   OBJECTIVE:   DIAGNOSTIC FINDINGS:  MRI 09/30/20: IMPRESSION: Status post L5-S1 decompression. A large right  paracentral and subarticular recess disc protrusion impinges on the right S1 root and deforms the right side of the thecal sac. There is also moderate right foraminal narrowing at this level.  Mild central canal and bilateral foraminal narrowing L4-5.  Xray lumbar completed 04/22/22.  PATIENT SURVEYS:  EVAL: LEFS: 21/80  COGNITIVE STATUS: Within functional limits for tasks assessed   POSTURE:  Eval: bil genu valgus and pronated through feet  GAIT: No AD at eval with slow cadence, short step length Comments: discussed trekking poles vs [facility]   Body Part #1 Lumbar   Lumbar ROM LUMBAR ROM:   Active  A/PROM  eval  Flexion Knee joint line  Extension   Right lateral flexion Joint line  Left lateral flexion Joint line  Right rotation   Left rotation    (Blank rows = not tested)       Body Part #2 Hip//knee   LE MMT LOWER EXTREMITY MMT:    MMT Right eval Left eval  Hip flexion 19.7 18.8  Hip extension    Hip abduction 28.5 21.5  Hip adduction    Hip internal rotation    Hip external rotation    Knee flexion 38.7 24.9  Knee extension 31.3 28.1   (Blank rows = not tested)     FUNCTIONAL TESTS:  30s chair stand: 5.5 wihtout UEs SLS: bil 2-3 s with ability to control balance correction without UEs   TREATMENT:                                                                                                                               DATE: 07/12/22  Pt seen for aquatic therapy today.  Treatment took place in water  3.25-4.75 ft in depth at the [facility] pool. Temp of water was 92.  Pt entered/exited the pool via stairs independently with bilat rail.   * with support of barbell: walking forward/ backwards;  side stepping;  high knee marching backwards; forward walking kicks * holding barbell: heel raises x 10  * holding wall: hip abdct 2 x10;leg swings hip flex to hip ext (toe touch) x 10;  then 5 reps Rt hip ext with slight trunk flex (improved  tolerance); single leg clam x 10 each (limited range in Rt hip)  * return to walking with and without support - forward/ backward  * holding barbell:   SLS x 10 sec;   tandem stance x 10 sec x 2 * holding wall:  relaxed squats x 5 * holding rails at stairs;  side to side lunges for adductor stretch 2 x 15 sec each;  R/L/R fig 4 stretch x 10sec each    Pt requires the buoyancy and hydrostatic pressure of water for support, and to offload joints by unweighting joint load by at least 50 % in navel deep water and by at least 75-80% in chest to neck deep water.  Viscosity of the water is needed for resistance of strengthening. Water current perturbations provides challenge to standing balance requiring increased core activation    DATE: 07/04/22  Manual piriformis and fig 4 stretch bil Manual HSS R   STM L and R lumbar p/s and glutes in sidelying Quadruped cat/cow x10ea   DATE: 06/26/22  Manual piriformis and fig 4 stretch bil STM L and R lumbar p/s and glutes (sideying and prone) LTR x5 ea LTR with UTR x5ea Hooklying PPT x10 Sidleying clams 2x10ea    DATE: 06/18/22  Seated hamstring stretch Seated hip hinge with dowel Sit<>stand     PATIENT EDUCATION:  Education details:  re-acquainting with aquatic therapy  Person educated: Patient Education method: Explanation, Demonstration, Tactile cues, Verbal cues, Education comprehension: verbalized understanding, returned demonstration, verbal cues required, tactile cues required, and needs further education  HOME EXERCISE PROGRAM: [id number]   ASSESSMENT:  CLINICAL IMPRESSION: Pt reported mild tightening in Rt low back/sacrum with hip ext and Rt adductor stretch. Rt hip rotators appear tight with clam exercise.  Pain reduced to 0/10 during session. She demonstrated decreased balance in SLS and tandem stance.   Plan to create HEP for pt to continue with at family pool this summer.     OBJECTIVE IMPAIRMENTS: Abnormal gait, decreased  activity tolerance, decreased balance, difficulty walking, decreased strength, impaired perceived functional ability, increased muscle spasms, impaired flexibility, improper body mechanics, postural dysfunction, and pain.   ACTIVITY LIMITATIONS: carrying, lifting, bending, sitting, standing, squatting, stairs, transfers, bathing, dressing, locomotion level, and caring for others  PARTICIPATION LIMITATIONS: meal prep, cleaning, laundry, shopping, community activity, occupation, and yard work  PERSONAL FACTORS: Time since onset of injury/illness/exacerbation and anxiety, h/o c-section  are also affecting patient's functional outcome.   REHAB POTENTIAL: Good  CLINICAL DECISION MAKING: Evolving/moderate complexity  EVALUATION COMPLEXITY: Moderate   GOALS: Goals reviewed with patient? Yes  SHORT TERM GOALS: Target date: 4/19  Pt will be compliant with daily HEP performance Baseline: began establishing at eval Goal status: IN PROGRESS  2.  Avoiding sustained seated positions greater than 45 min when able Baseline: reports pain with 1.5 hr of sitting and encouraged her to avoid greater than 45 min throughout the day Goal status: INITIAL   LONG TERM GOALS: Target date: POC date  LEFS to improve  by [facility] Baseline: 9 point MDC Goal status: INITIAL  2.  Able to walk through [facility] without feeling shaky Baseline: she feels like this is her most visited store as she orders groceries online Goal status: INITIAL  3.  Gross MMT to improve by 10% Baseline: see obj Goal status: INITIAL  4.  Pt will verbalize reduction in sensation of back "needing to pop" Baseline: constant at eval Goal status: INITIAL    PLAN:  PT FREQUENCY: 1x/week  PT DURATION: 12 weeks  PLANNED INTERVENTIONS: Therapeutic exercises, Therapeutic activity, Neuromuscular re-education, Balance training, Gait training, Patient/Family education, Self Care, Joint mobilization, Stair training, Aquatic Therapy, Dry  Needling, Spinal mobilization, Cryotherapy, Moist heat, Taping, Ionotophoresis /ml Dexamethasone, Manual therapy, and Re-evaluation.  PLAN FOR NEXT SESSION: STM to Rt superior glut max, CKC strengthening focus   Mayer Camel, PTA 07/12/22 10:04 AM [facility] [address] [place], [place], [address] Phone: [phone]   Fax:  [phone]     Check all possible CPT codes: [phone] - PT Re-evaluation, 97110- Therapeutic Exercise, [phone]- Neuro Re-education, [phone] - Gait Training, [phone] - Manual Therapy, [phone] - Therapeutic Activities, [phone] - Self Care, [phone] - Iontophoresis, and [id number] - Aquatic therapy    Check all conditions that are expected to impact treatment: Musculoskeletal disorders   If treatment provided at initial evaluation, no treatment charged due to lack of authorization.

## 2022-07-17 ENCOUNTER — Encounter: Payer: Self-pay | Admitting: Orthopedic Surgery

## 2022-07-17 ENCOUNTER — Ambulatory Visit (HOSPITAL_BASED_OUTPATIENT_CLINIC_OR_DEPARTMENT_OTHER): Payer: Medicaid Other

## 2022-07-17 ENCOUNTER — Encounter (HOSPITAL_BASED_OUTPATIENT_CLINIC_OR_DEPARTMENT_OTHER): Payer: Self-pay

## 2022-07-17 DIAGNOSIS — M5459 Other low back pain: Secondary | ICD-10-CM

## 2022-07-17 DIAGNOSIS — R262 Difficulty in walking, not elsewhere classified: Secondary | ICD-10-CM

## 2022-07-17 DIAGNOSIS — M6281 Muscle weakness (generalized): Secondary | ICD-10-CM | POA: Diagnosis not present

## 2022-07-17 NOTE — Therapy (Signed)
OUTPATIENT PHYSICAL THERAPY EVALUATION   Patient Name: Ellen Booker MRN: [id number] DOB:September 14, 1980, 42 y.o., female Today's Date: 07/17/2022  END OF SESSION:  PT End of Session - 07/17/22 0934     Visit Number 5    Number of Visits 13    Date for PT Re-Evaluation 09/13/22    Authorization Type [place] [employer]    Authorization - Number of Visits 27    PT Start Time 0930    PT Stop Time 1015    PT Time Calculation (min) 45 min    Activity Tolerance Patient tolerated treatment well    Behavior During Therapy WFL for tasks assessed/performed              Past Medical History:  Diagnosis Date   Abdominal wall mass    left side   Anemia    Anxiety    Depression    History of Helicobacter pylori infection    per pt 2016   Renal cyst, right    per CT 04-04-2016   Vaginal Pap smear, abnormal    Wears glasses    Past Surgical History:  Procedure Laterality Date   back  08/2021   CESAREAN SECTION  2010   COLONOSCOPY  last one 06-19-2015   EXCISION ABDOMINAL WALL MASS  2012   right side   EXCISION MASS ABDOMINAL N/A 05/21/2016   Procedure: EXCISION ABDOMINAL WALL MASS;  Surgeon: Rodman Pickle, MD;  Location: [facility] [place];  Service: General;  Laterality: N/A;   RESECTION OF ABDOMINAL MASS     Patient Active Problem List   Diagnosis Date Noted   Dysmenorrhea 02/01/2014   Tobacco dependency 02/01/2014   Obesity 12/09/2013   Essential hypertension, benign 12/09/2013   Candidiasis of vulva and vagina 12/08/2013   Bilateral nipple discharge 11/08/2013   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   Unspecified disorder of menstruation and other abnormal bleeding from female genital tract 11/08/2013   Irregular menstrual cycle 11/08/2013   UTI (lower urinary tract infection) 01/29/2013   HSV (herpes simplex virus) infection 01/26/2013   Well woman exam with routine gynecological exam 01/26/2013   BMI 40.0-44.9, adult 01/26/2013   Elevated BP  01/26/2013    REFERRING PROVIDER:  Jonetta Speak, NP   REFERRING DIAG:  Diagnosis  Z98.1 (ICD-10-CM) - Arthrodesis status  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 "I couldn't really walk for 2 days." She reports 5/10 pain level. "I feel weak since I haven't really been exercising in a long time." Pt reports she spoke with insurance company who told her she cannot do both aquatic and land PT simultaneously.   PERTINENT HISTORY:  Anxiety, h/o c-section  PAIN:  Are you having pain? Yes: NPRS scale: 4/10 Pain location: middle of lower back Pain description: sharp pain to the right lower back where it feels like a pull with a long stride Aggravating factors: long stride Relieving factors: pain meds, frequent seated rest breaks that are short  PRECAUTIONS: None  WEIGHT BEARING RESTRICTIONS: No  FALLS:  Has patient fallen in last 6 months? Yes. Number of falls 1  LIVING ENVIRONMENT: Lives with: lives with an adult companion Lives in: House/apartment Stairs: yes Has following equipment at home: Single point cane and Environmental consultant - 4 wheeled  OCCUPATION: not working  PLOF: Independent with basic ADLs  PATIENT GOALS: lift leg for more than a couple of seconds without shaking, regain strength, relieve sensation that back needs to pop   OBJECTIVE:   DIAGNOSTIC FINDINGS:  MRI 09/30/20: IMPRESSION: Status post L5-S1  decompression. A large right paracentral and subarticular recess disc protrusion impinges on the right S1 root and deforms the right side of the thecal sac. There is also moderate right foraminal narrowing at this level.  Mild central canal and bilateral foraminal narrowing L4-5.  Xray lumbar completed 04/22/22.  PATIENT SURVEYS:  EVAL: LEFS: 21/80  COGNITIVE STATUS: Within functional limits for tasks assessed   POSTURE:  Eval: bil genu valgus and pronated through feet  GAIT: No AD at eval with slow cadence, short step length Comments: discussed trekking poles vs [facility]   Body Part #1 Lumbar   Lumbar ROM LUMBAR ROM:   Active  A/PROM  eval  Flexion Knee joint line  Extension   Right lateral flexion Joint line  Left lateral flexion Joint line  Right rotation   Left rotation    (Blank rows = not tested)       Body Part #2 Hip//knee   LE MMT LOWER EXTREMITY MMT:    MMT Right eval Left eval  Hip flexion 19.7 18.8  Hip extension    Hip abduction 28.5 21.5  Hip adduction    Hip internal rotation    Hip external rotation    Knee flexion 38.7 24.9  Knee extension 31.3 28.1   (Blank rows = not tested)     FUNCTIONAL TESTS:  30s chair stand: 5.5 wihtout UEs SLS: bil 2-3 s with ability to control balance correction without UEs   TREATMENT:                                                                                                                                DATE: 07/17/22  Manual piriformis and fig 4 stretch  R Manual hip flexor stretch in s/l (had back pain with this so stopped) Manual ITB stretch R STM R glutes (sideying) LTR x15ea with cues for breathing and m. relaxation Hooklying PPT x10 reviewed technique Quadruped cat/cow- 5sec Child's pose stretch 20sec x3 BKFO x10ea  Updated HEP  DATE: 07/12/22  Pt seen for aquatic therapy today.  Treatment took place in water 3.25-4.75 ft in depth at the [facility] pool. Temp of water was 92.   Pt entered/exited the pool via stairs independently with bilat rail.   * with support of barbell: walking forward/ backwards;  side stepping;  high knee marching backwards; forward walking kicks * holding barbell: heel raises x 10  * holding wall: hip abdct 2 x10;leg swings hip flex to hip ext (toe touch) x 10;  then 5 reps Rt hip ext with slight trunk flex (improved tolerance); single leg clam x 10 each (limited range in Rt hip)  * return to walking with and without support - forward/ backward  * holding barbell:   SLS x 10 sec;   tandem stance x 10 sec x 2 * holding wall:  relaxed squats x 5 * holding rails at stairs;  side to side lunges for adductor stretch 2 x 15 sec each;  R/L/R fig 4 stretch x 10sec each    Pt requires the buoyancy and hydrostatic pressure of water for support, and to offload joints by unweighting joint load by at least 50 % in navel deep water and by at least 75-80% in chest to neck deep water.  Viscosity of the water is needed for resistance of strengthening. Water current perturbations provides challenge to standing balance requiring increased core activation    DATE: 07/04/22  Manual piriformis and fig 4 stretch bil Manual HSS R   STM L and R lumbar p/s and glutes in sidelying Quadruped cat/cow x10ea   DATE: 06/26/22  Manual piriformis and fig 4 stretch bil STM L and R lumbar p/s and glutes (sideying and prone) LTR x5 ea LTR with UTR x5ea Hooklying PPT x10 Sidelying clams 2x10ea  PATIENT EDUCATION:  Education details:  re-acquainting with aquatic therapy  Person educated: Patient Education method: Explanation, Demonstration, Tactile cues, Verbal cues, Education comprehension: verbalized understanding, returned demonstration, verbal cues required, tactile cues required, and needs further education  HOME EXERCISE PROGRAM: [id number]   ASSESSMENT:  CLINICAL IMPRESSION: Discussed with pt continuing land PT and supplementing with pool exercises and GAC.  Pt in agreement with this. Pt is limited by tension/guarding. Educated pt on breathing technqiues and importance of practicing relaxation of mm. Throughout the day to reduce spasm and improve pain free mobility. Updated HEP to include additional stretches to performed with focus on relaxation. Reviewed PPT for proper performance as pt had incorrect technique. Will monitor  hip discomfort.    OBJECTIVE IMPAIRMENTS: Abnormal gait, decreased activity tolerance, decreased balance, difficulty walking, decreased strength, impaired perceived functional ability, increased muscle spasms, impaired flexibility, improper body mechanics, postural dysfunction, and pain.   ACTIVITY LIMITATIONS: carrying, lifting, bending, sitting, standing, squatting, stairs, transfers, bathing, dressing, locomotion level, and caring for others  PARTICIPATION LIMITATIONS: meal prep, cleaning, laundry, shopping, community activity, occupation, and yard work  PERSONAL FACTORS: Time since onset of injury/illness/exacerbation and anxiety, h/o c-section  are also affecting patient's functional outcome.   REHAB POTENTIAL: Good  CLINICAL DECISION MAKING: Evolving/moderate complexity  EVALUATION COMPLEXITY: Moderate   GOALS: Goals reviewed with patient? Yes  SHORT TERM GOALS: Target date: 4/19  Pt will  be compliant with daily HEP performance Baseline: began establishing at eval Goal status: IN PROGRESS  2.  Avoiding sustained seated positions greater than 45 min when able Baseline: reports pain with 1.5 hr of sitting and encouraged her to avoid greater than 45 min throughout the day Goal status: INITIAL   LONG TERM GOALS: Target date: POC date  LEFS to improve by MDC Baseline: 9 point MDC Goal status: INITIAL  2.  Able to walk through [facility] without feeling shaky Baseline: she feels like this is her most visited store as she orders groceries online Goal status: INITIAL  3.  Gross MMT to improve by  10% Baseline: see obj Goal status: INITIAL  4.  Pt will verbalize reduction in sensation of back "needing to pop" Baseline: constant at eval Goal status: INITIAL    PLAN:  PT FREQUENCY: 1x/week  PT DURATION: 12 weeks  PLANNED INTERVENTIONS: Therapeutic exercises, Therapeutic activity, Neuromuscular re-education, Balance training, Gait training, Patient/Family education, Self Care, Joint mobilization, Stair training, Aquatic Therapy, Dry Needling, Spinal mobilization, Cryotherapy, Moist heat, Taping, Ionotophoresis 4mg /ml Dexamethasone, Manual therapy, and Re-evaluation.  PLAN FOR NEXT SESSION: STM to Rt superior glut max, CKC strengthening focus   Mayer Camel, PTA 07/17/22 11:28 AM [facility] [address] [place], [place], [address] Phone: [phone]   Fax:  [phone]     Check all possible CPT codes: [phone] - PT Re-evaluation, 97110- Therapeutic Exercise, [phone]- Neuro Re-education, [phone] - Gait Training, [phone] - Manual Therapy, [phone] - Therapeutic Activities, [phone] - Self Care, [phone] - Iontophoresis, and [id number] - Aquatic therapy    Check all conditions that are expected to impact treatment: Musculoskeletal disorders   If treatment provided at initial evaluation, no treatment charged due to lack of authorization.

## 2022-07-24 ENCOUNTER — Encounter (HOSPITAL_BASED_OUTPATIENT_CLINIC_OR_DEPARTMENT_OTHER): Payer: Self-pay | Admitting: Physical Therapy

## 2022-07-24 ENCOUNTER — Encounter (HOSPITAL_BASED_OUTPATIENT_CLINIC_OR_DEPARTMENT_OTHER): Payer: Medicaid Other

## 2022-07-25 ENCOUNTER — Ambulatory Visit (HOSPITAL_BASED_OUTPATIENT_CLINIC_OR_DEPARTMENT_OTHER): Payer: Medicaid Other | Attending: Nurse Practitioner

## 2022-07-25 ENCOUNTER — Encounter (HOSPITAL_BASED_OUTPATIENT_CLINIC_OR_DEPARTMENT_OTHER): Payer: Self-pay

## 2022-07-25 DIAGNOSIS — M6281 Muscle weakness (generalized): Secondary | ICD-10-CM | POA: Insufficient documentation

## 2022-07-25 DIAGNOSIS — R262 Difficulty in walking, not elsewhere classified: Secondary | ICD-10-CM

## 2022-07-25 DIAGNOSIS — M5459 Other low back pain: Secondary | ICD-10-CM

## 2022-07-25 NOTE — Therapy (Signed)
OUTPATIENT PHYSICAL THERAPY EVALUATION   Patient Name: Ellen Booker MRN: [id number] DOB:[birth year], 42 y.o., female Today's Date: 07/25/2022  END OF SESSION:  PT End of Session - 07/25/22 0929     Visit Number 6    Number of Visits 13    Date for PT Re-Evaluation 09/13/22    Authorization Type [place] [employer]    Authorization - Number of Visits 27    PT Start Time 0845    PT Stop Time 0931    PT Time Calculation (min) 46 min    Activity Tolerance Patient tolerated treatment well    Behavior During Therapy [facility] for tasks assessed/performed               Past Medical History:  Diagnosis Date   Abdominal wall mass    left side   Anemia    Anxiety    Depression    History of Helicobacter pylori infection    per pt 2016   Renal cyst, right    per CT 04-04-2016   Vaginal Pap smear, abnormal    Wears glasses    Past Surgical History:  Procedure Laterality Date   back  08/2021   CESAREAN SECTION  2010   COLONOSCOPY  last one 06-19-2015   EXCISION ABDOMINAL WALL MASS  2012   right side   EXCISION MASS ABDOMINAL N/A 05/21/2016   Procedure: EXCISION ABDOMINAL WALL MASS;  Surgeon: Rodman Pickle, MD;  Location: [facility];  Service: General;  Laterality: N/A;   RESECTION OF ABDOMINAL MASS     Patient Active Problem List   Diagnosis Date Noted   Dysmenorrhea 02/01/2014   Tobacco dependency 02/01/2014   Obesity 12/09/2013   Essential hypertension, benign 12/09/2013   Candidiasis of vulva and vagina 12/08/2013   Bilateral nipple discharge 11/08/2013   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   Unspecified disorder of menstruation and other abnormal bleeding from female genital tract 11/08/2013   Irregular menstrual cycle 11/08/2013   UTI (lower urinary tract infection) 01/29/2013   HSV (herpes simplex virus) infection 01/26/2013   Well woman exam with routine gynecological exam 01/26/2013   BMI 40.0-44.9, adult (HCC) 01/26/2013   Elevated BP  01/26/2013    REFERRING PROVIDER:  Jonetta Speak, NP   REFERRING DIAG:  Diagnosis  Z98.1 (ICD-10-CM) - Arthrodesis status  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 Has difficulty with sit to stand exercise with dowel during HEP. Lso reports pain with child's pose stretch. 3/10 pain level at entry. Did try walking on Treadmill at home but felt pain with this.    PERTINENT HISTORY:  Anxiety, h/o c-section  PAIN:  Are you having pain? Yes: NPRS scale: 3/10 Pain location: middle of lower back Pain description: sharp pain to the right lower back where it feels like a pull with a long stride Aggravating factors: long stride Relieving factors: pain meds, frequent seated rest breaks that are short  PRECAUTIONS: None  WEIGHT BEARING RESTRICTIONS: No  FALLS:  Has patient fallen in last 6 months? Yes. Number of falls 1  LIVING ENVIRONMENT: Lives with: lives with an adult companion Lives in: House/apartment Stairs: yes Has following equipment at home: Single point cane and Environmental consultant - 4 wheeled  OCCUPATION: not working  PLOF: Independent with basic ADLs  PATIENT GOALS: lift leg for more than a couple of seconds without shaking, regain strength, relieve sensation that back needs to pop   OBJECTIVE:   DIAGNOSTIC FINDINGS:  MRI 09/30/20: IMPRESSION: Status post L5-S1 decompression. A large right paracentral and subarticular recess disc  protrusion impinges on the right S1 root and deforms the right side of the thecal sac. There is also moderate right foraminal narrowing at this level.  Mild central canal and bilateral foraminal narrowing L4-5.  Xray lumbar completed 04/22/22.  PATIENT SURVEYS:  EVAL: LEFS: 21/80  COGNITIVE STATUS: Within functional limits for tasks assessed   POSTURE:  Eval: bil genu valgus and pronated through feet  GAIT: No AD at eval with slow cadence, short step length Comments: discussed trekking poles vs [facility]   Body Part #1 Lumbar   Lumbar ROM LUMBAR ROM:   Active  A/PROM  eval  Flexion Knee joint line  Extension   Right lateral flexion Joint line  Left lateral flexion Joint line  Right rotation   Left rotation    (Blank rows = not tested)       Body Part #2 Hip//knee   LE MMT LOWER EXTREMITY MMT:    MMT Right eval Left eval  Hip flexion 19.7 18.8  Hip extension    Hip abduction 28.5 21.5  Hip adduction    Hip internal rotation    Hip external rotation    Knee flexion 38.7 24.9  Knee extension 31.3 28.1   (Blank rows = not tested)     FUNCTIONAL TESTS:  30s chair stand: 5.5 wihtout UEs SLS: bil 2-3 s with ability to control balance correction without UEs   TREATMENT:                                                                                                                                DATE: 07/25/22  Manual HSS R STM R lumbar ps/glutes prone  LTR x15ea Hooklying PPT 2x10  Quadruped cat/cow-  5sec x10 Supine march 2x10 Supine clam with GTB 2x10   PATIENT EDUCATION:  Education details:  re-acquainting with aquatic therapy  Person educated: Patient Education method: Explanation, Demonstration, Tactile cues, Verbal cues, Education comprehension: verbalized understanding, returned demonstration, verbal cues required, tactile cues required, and needs further education  HOME EXERCISE PROGRAM: [id number]   ASSESSMENT:  CLINICAL  IMPRESSION: Discussed with pt modifications for HEP exercises to avoid pain. She had increased back pain by end of session after multiple position changes. MT performed to decrease soft tissue restrictions in R glutes and low back. Very tender to palpation of R hip near sacral border. Some pain with hooklying marhes in low back. Pt will continue to benefit from PT to improve core and hip strength while decreasing pain and guarding.    OBJECTIVE IMPAIRMENTS: Abnormal gait, decreased activity tolerance, decreased balance, difficulty walking, decreased strength, impaired perceived functional ability, increased muscle spasms, impaired flexibility, improper body mechanics, postural dysfunction, and pain.   ACTIVITY LIMITATIONS: carrying, lifting, bending, sitting, standing, squatting, stairs, transfers, bathing, dressing, locomotion level, and caring for others  PARTICIPATION LIMITATIONS: meal prep, cleaning, laundry, shopping, community activity, occupation, and yard work  PERSONAL FACTORS: Time since onset of injury/illness/exacerbation and anxiety, h/o c-section  are also affecting patient's functional outcome.   REHAB POTENTIAL: Good  CLINICAL DECISION MAKING: Evolving/moderate complexity  EVALUATION COMPLEXITY: Moderate   GOALS: Goals reviewed with patient? Yes  SHORT TERM GOALS: Target date: 4/19  Pt will be compliant with daily HEP performance Baseline: began establishing at eval Goal status: IN PROGRESS  2.  Avoiding sustained seated positions greater than 45 min when able Baseline: reports pain with 1.5 hr of sitting and encouraged her to avoid greater than 45 min throughout the day Goal status: INITIAL   LONG TERM GOALS: Target date: POC date  LEFS to improve by MDC Baseline: 9 point MDC Goal status: INITIAL  2.  Able to walk through [facility] without feeling shaky Baseline: she feels like this is her most visited store as she orders groceries online Goal status:  INITIAL  3.  Gross MMT to improve by 10% Baseline: see obj Goal status: INITIAL  4.  Pt will verbalize reduction in sensation of back "needing to pop" Baseline: constant at eval Goal status: INITIAL    PLAN:  PT FREQUENCY: 1x/week  PT DURATION: 12 weeks  PLANNED INTERVENTIONS: Therapeutic exercises, Therapeutic activity, Neuromuscular re-education, Balance training, Gait training, Patient/Family education, Self Care, Joint mobilization, Stair training, Aquatic Therapy, Dry Needling, Spinal mobilization, Cryotherapy, Moist heat, Taping, Ionotophoresis 4mg /ml Dexamethasone, Manual therapy, and Re-evaluation.  PLAN FOR NEXT SESSION: STM to Rt superior glut max, CKC strengthening focus   Riki Altes, PTA  07/25/22 10:17 AM [facility] [address] [place], [place], [address] Phone: [phone]   Fax:  [phone]     Check all possible CPT codes: [phone] - PT Re-evaluation, 97110- Therapeutic Exercise, [phone]- Neuro Re-education, [phone] - Gait Training, [phone] - Manual Therapy, [phone] - Therapeutic Activities, [phone] - Self Care, [phone] - Iontophoresis, and [id number] - Aquatic therapy    Check all conditions that are expected to impact treatment: Musculoskeletal disorders   If treatment provided at initial evaluation, no treatment charged due to lack of authorization.

## 2022-07-31 ENCOUNTER — Encounter (HOSPITAL_BASED_OUTPATIENT_CLINIC_OR_DEPARTMENT_OTHER): Payer: Self-pay | Admitting: Physical Therapy

## 2022-07-31 ENCOUNTER — Ambulatory Visit (HOSPITAL_BASED_OUTPATIENT_CLINIC_OR_DEPARTMENT_OTHER): Payer: Medicaid Other | Admitting: Physical Therapy

## 2022-07-31 DIAGNOSIS — R262 Difficulty in walking, not elsewhere classified: Secondary | ICD-10-CM

## 2022-07-31 DIAGNOSIS — M6281 Muscle weakness (generalized): Secondary | ICD-10-CM | POA: Diagnosis not present

## 2022-07-31 DIAGNOSIS — M5459 Other low back pain: Secondary | ICD-10-CM

## 2022-07-31 NOTE — Therapy (Signed)
OUTPATIENT PHYSICAL THERAPY EVALUATION   Patient Name: Ellen Booker MRN: [id number] DOB:1980/08/22, 42 y.o., female Today's Date: 07/31/2022  END OF SESSION:  PT End of Session - 07/31/22 0931     Visit Number 7    Number of Visits 13    Date for PT Re-Evaluation 09/13/22    Authorization Type [place] [employer]    Authorization - Number of Visits 27    PT Start Time 0931    PT Stop Time 1015    PT Time Calculation (min) 44 min    Activity Tolerance Patient tolerated treatment well    Behavior During Therapy [facility] for tasks assessed/performed               Past Medical History:  Diagnosis Date   Abdominal wall mass    left side   Anemia    Anxiety    Depression    History of Helicobacter pylori infection    per pt 2016   Renal cyst, right    per CT 04-04-2016   Vaginal Pap smear, abnormal    Wears glasses    Past Surgical History:  Procedure Laterality Date   back  08/2021   CESAREAN SECTION  2010   COLONOSCOPY  last one 06-19-2015   EXCISION ABDOMINAL WALL MASS  2012   right side   EXCISION MASS ABDOMINAL N/A 05/21/2016   Procedure: EXCISION ABDOMINAL WALL MASS;  Surgeon: Rodman Pickle, MD;  Location: [facility] [place];  Service: General;  Laterality: N/A;   RESECTION OF ABDOMINAL MASS     Patient Active Problem List   Diagnosis Date Noted   Dysmenorrhea 02/01/2014   Tobacco dependency 02/01/2014   Obesity 12/09/2013   Essential hypertension, benign 12/09/2013   Candidiasis of vulva and vagina 12/08/2013   Bilateral nipple discharge 11/08/2013   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   Unspecified disorder of menstruation and other abnormal bleeding from female genital tract 11/08/2013   Irregular menstrual cycle 11/08/2013   UTI (lower urinary tract infection) 01/29/2013   HSV (herpes simplex virus) infection 01/26/2013   Well woman exam with routine gynecological exam 01/26/2013   BMI 40.0-44.9, adult (HCC) 01/26/2013   Elevated BP  01/26/2013    REFERRING PROVIDER:  Jonetta Speak, NP   REFERRING DIAG:  Diagnosis  Z98.1 (ICD-10-CM) - Arthrodesis status  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 Trying to take them sporadically. I have just not moved. Had to physically help at home following spouse surgery. Surgeon placed order for CT June 13. Every one of the exercises hurts my back- the standing from a chair is a sharp pain, flexion with spinal support gives sharp pain. Denies radicular sympoms. Had to stop cat/cow hurts in the hyperext. More weakness and shakiness in the last 3 days with an increase in pain.   PERTINENT HISTORY:  Anxiety, h/o c-section  PAIN:  Are you having pain? Yes: NPRS scale: 3/10 Pain location: middle of lower back Pain description: sharp pain to the right lower back where it feels like a pull with a long stride Aggravating factors: long stride Relieving factors: pain meds, laying on left side.   PRECAUTIONS: None  WEIGHT BEARING RESTRICTIONS: No  FALLS:  Has patient fallen in last 6 months? Yes. Number of falls 1  LIVING ENVIRONMENT: Lives with: lives with an adult companion Lives in: House/apartment Stairs: yes Has following equipment at home: Single point cane and Environmental consultant - 4 wheeled  OCCUPATION: not working  PLOF: Independent with basic ADLs  PATIENT GOALS: lift leg for  more than a couple of seconds without shaking, regain strength, relieve sensation that back needs to pop   OBJECTIVE:   DIAGNOSTIC FINDINGS:  MRI 09/30/20: IMPRESSION: Status post L5-S1 decompression. A large right paracentral and subarticular recess disc protrusion impinges on the right S1 root and deforms the right side of the thecal sac. There is also moderate right foraminal narrowing at this level.  Mild central canal and bilateral foraminal narrowing L4-5.  Xray lumbar completed 04/22/22.  PATIENT SURVEYS:  EVAL: LEFS: 21/80  COGNITIVE STATUS: Within functional limits for tasks assessed   POSTURE:  Eval: bil genu valgus and pronated through feet  GAIT: No AD at eval with slow cadence, short step length Comments: discussed trekking poles vs [facility]   Body Part #1 Lumbar   Lumbar ROM LUMBAR ROM:   Active  A/PROM  eval  Flexion Knee joint line  Extension   Right lateral flexion Joint line  Left lateral flexion Joint line  Right rotation   Left rotation    (Blank rows = not tested)       Body Part #2 Hip//knee   LE MMT LOWER EXTREMITY MMT:    MMT Right eval Left eval  Hip flexion 19.7 18.8  Hip extension    Hip abduction 28.5 21.5  Hip adduction    Hip internal rotation    Hip external rotation    Knee flexion 38.7 24.9  Knee extension 31.3 28.1   (Blank rows = not tested)     FUNCTIONAL TESTS:  30s chair stand: 5.5 wihtout UEs SLS: bil 2-3 s with ability to control balance correction without UEs   TREATMENT:  Treatment                            5/8:  Trigger Point Dry Needling, Manual Therapy Treatment:  Initial or subsequent education regarding Trigger Point Dry Needling: Initial Did patient give consent to treatment with Trigger Point Dry Needling: Yes TPDN with skilled palpation and monitoring followed by STM to  the following muscles: Rt parapsinals T12-L5, Rt glut max/med/min moving lateral from SIJ  SIJ mobs PA and inf on Rt upper quadrant Seated hp hinge Qped rocking    DATE: 07/25/22  Manual HSS R STM R lumbar ps/glutes prone  LTR x15ea Hooklying PPT 2x10  Quadruped cat/cow- 5sec x10 Supine march 2x10 Supine clam with GTB 2x10   PATIENT EDUCATION:  Education details:  re-acquainting with aquatic therapy  Person educated: Patient Education method: Explanation, Demonstration, Tactile cues, Verbal cues, Education comprehension: verbalized understanding, returned demonstration, verbal cues required, tactile cues required, and needs further education  HOME EXERCISE PROGRAM: [id number]   ASSESSMENT:  CLINICAL IMPRESSION: Time taken today to discuss changes and lack of changes requiring her to take more Percocet throughout her day.  Patient was agreeable to try dry needling to lumbar paraspinals and right hip musculature in order to address sacroiliac joint pain.  Patient reported feeling like she was looser upon standing and denies increase in concordant pain.  Advised that she should expect to be sore and fatigued as well as some point tenderness to the SI joint itself.  Noted hip hinge was lacking and patient was utilizing lower thoracic to upper lumbar region as a hinge point.  Was able to demonstrate proper hip hinge without increase in pain at the end of the session today.   OBJECTIVE IMPAIRMENTS: Abnormal gait, decreased activity tolerance, decreased balance, difficulty walking, decreased strength, impaired perceived functional ability, increased muscle spasms, impaired flexibility, improper body mechanics, postural dysfunction, and pain.   ACTIVITY LIMITATIONS: carrying, lifting, bending, sitting, standing, squatting, stairs, transfers, bathing, dressing, locomotion level, and caring for others  PARTICIPATION LIMITATIONS: meal prep, cleaning, laundry, shopping, community activity,  occupation, and yard work  PERSONAL FACTORS: Time since onset of injury/illness/exacerbation and anxiety, h/o c-section  are also affecting patient's functional outcome.   REHAB POTENTIAL: Good  CLINICAL DECISION MAKING: Evolving/moderate complexity  EVALUATION COMPLEXITY: Moderate   GOALS: Goals reviewed with patient? Yes  SHORT TERM GOALS: Target date: 4/19  Pt will be compliant with daily HEP performance Baseline: began establishing at eval Goal status: IN PROGRESS  2.  Avoiding sustained seated positions greater than 45 min when able Baseline: reports pain with 1.5 hr of sitting and encouraged her to avoid greater than 45 min throughout the day Goal status: INITIAL   LONG TERM GOALS: Target date: POC date  LEFS to improve by MDC Baseline: 9 point MDC Goal status: INITIAL  2.  Able to walk through [facility] without feeling shaky Baseline: she feels like this is her most visited store as she orders groceries online Goal status: INITIAL  3.  Gross MMT to improve by 10% Baseline: see obj Goal status: INITIAL  4.  Pt will verbalize reduction in sensation of back "needing to pop" Baseline: constant at eval Goal status: INITIAL    PLAN:  PT FREQUENCY: 1x/week  PT DURATION: 12 weeks  PLANNED INTERVENTIONS: Therapeutic exercises, Therapeutic activity, Neuromuscular re-education, Balance training, Gait training, Patient/Family education, Self Care, Joint mobilization, Stair training, Aquatic Therapy, Dry Needling, Spinal mobilization, Cryotherapy, Moist heat,  Taping, Ionotophoresis 4mg /ml Dexamethasone, Manual therapy, and Re-evaluation.  PLAN FOR NEXT SESSION: Continue dry needling as needed, right SI joint mobilizations   Siri Buege C. Cayce Quezada PT, DPT 07/31/22 1:51 PM  [facility] [address] [place], [place], [address] Phone: [phone]   Fax:  [phone]     Check all possible CPT codes: [phone] -  PT Re-evaluation, 97110- Therapeutic Exercise, [phone]- Neuro Re-education, [phone] - Gait Training, [phone] - Manual Therapy, [phone] - Therapeutic Activities, [phone] - Self Care, [phone] - Iontophoresis, and [id number] - Aquatic therapy    Check all conditions that are expected to impact treatment: Musculoskeletal disorders   If treatment provided at initial evaluation, no treatment charged due to lack of authorization.

## 2022-08-07 ENCOUNTER — Ambulatory Visit (HOSPITAL_BASED_OUTPATIENT_CLINIC_OR_DEPARTMENT_OTHER): Payer: Medicaid Other

## 2022-08-07 ENCOUNTER — Encounter (HOSPITAL_BASED_OUTPATIENT_CLINIC_OR_DEPARTMENT_OTHER): Payer: Self-pay

## 2022-08-07 DIAGNOSIS — M5459 Other low back pain: Secondary | ICD-10-CM

## 2022-08-07 DIAGNOSIS — M6281 Muscle weakness (generalized): Secondary | ICD-10-CM

## 2022-08-07 DIAGNOSIS — R262 Difficulty in walking, not elsewhere classified: Secondary | ICD-10-CM

## 2022-08-07 NOTE — Therapy (Signed)
OUTPATIENT PHYSICAL THERAPY EVALUATION   Patient Name: Ellen Booker MRN: [id number] DOB:Dec 01, 1980, 42 y.o., female Today's Date: 08/07/2022  END OF SESSION:  PT End of Session - 08/07/22 0829     Visit Number 8    Number of Visits 13    Date for PT Re-Evaluation 09/13/22    Authorization Type [place] [employer]    Authorization - Number of Visits 27    PT Start Time 0852    PT Stop Time 0934    PT Time Calculation (min) 42 min    Activity Tolerance Patient tolerated treatment well    Behavior During Therapy [facility] for tasks assessed/performed                Past Medical History:  Diagnosis Date   Abdominal wall mass    left side   Anemia    Anxiety    Depression    History of Helicobacter pylori infection    per pt 2016   Renal cyst, right    per CT 04-04-2016   Vaginal Pap smear, abnormal    Wears glasses    Past Surgical History:  Procedure Laterality Date   back  08/2021   CESAREAN SECTION  2010   COLONOSCOPY  last one 06-19-2015   EXCISION ABDOMINAL WALL MASS  2012   right side   EXCISION MASS ABDOMINAL N/A 05/21/2016   Procedure: EXCISION ABDOMINAL WALL MASS;  Surgeon: Rodman Pickle, MD;  Location: [facility];  Service: General;  Laterality: N/A;   RESECTION OF ABDOMINAL MASS     Patient Active Problem List   Diagnosis Date Noted   Dysmenorrhea 02/01/2014   Tobacco dependency 02/01/2014   Obesity 12/09/2013   Essential hypertension, benign 12/09/2013   Candidiasis of vulva and vagina 12/08/2013   Bilateral nipple discharge 11/08/2013   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   Unspecified disorder of menstruation and other abnormal bleeding from female genital tract 11/08/2013   Irregular menstrual cycle 11/08/2013   UTI (lower urinary tract infection) 01/29/2013   HSV (herpes simplex virus) infection 01/26/2013   Well woman exam with routine gynecological exam 01/26/2013   BMI 40.0-44.9, adult (HCC) 01/26/2013   Elevated BP  01/26/2013    REFERRING PROVIDER:  Jonetta Speak, NP   REFERRING DIAG:  Diagnosis  Z98.1 (ICD-10-CM) - Arthrodesis status  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 With exercises, it hurts more. She reports the dry needling provided temporary relief and she was able to bend from her waist better.   PERTINENT HISTORY:  Anxiety, h/o c-section  PAIN:  Are you having pain? Yes: NPRS scale: 3/10 Pain location: middle of lower back Pain description: sharp pain to the right lower back where it feels like a pull with a long stride Aggravating factors: long stride Relieving factors: pain meds, laying on left side.   PRECAUTIONS: None  WEIGHT BEARING RESTRICTIONS: No  FALLS:  Has patient fallen in last 6 months? Yes. Number of falls 1  LIVING ENVIRONMENT: Lives with: lives with an adult companion Lives in: House/apartment Stairs: yes Has following equipment at home: Single point cane and Environmental consultant - 4 wheeled  OCCUPATION: not working  PLOF: Independent with basic ADLs  PATIENT GOALS: lift leg for more than a couple of seconds without shaking, regain strength, relieve sensation that back needs to pop   OBJECTIVE:   DIAGNOSTIC FINDINGS:  MRI 09/30/20: IMPRESSION: Status post L5-S1 decompression. A large right paracentral and subarticular recess disc protrusion impinges on the right S1 root and deforms the right side of the thecal  sac. There is also moderate right foraminal narrowing at this level.  Mild central canal and bilateral foraminal narrowing L4-5.  Xray lumbar completed 04/22/22.  PATIENT SURVEYS:  EVAL: LEFS: 21/80  COGNITIVE STATUS: Within functional limits for tasks assessed   POSTURE:  Eval: bil genu valgus and pronated through feet  GAIT: No AD at eval with slow cadence, short step length Comments: discussed trekking poles vs [facility]   Body Part #1 Lumbar   Lumbar ROM LUMBAR ROM:   Active  A/PROM  eval  Flexion Knee joint line  Extension   Right lateral flexion Joint line  Left lateral flexion Joint line  Right rotation   Left rotation    (Blank rows = not tested)       Body Part #2 Hip//knee   LE MMT LOWER EXTREMITY MMT:    MMT Right eval Left eval  Hip flexion 19.7 18.8  Hip extension    Hip abduction 28.5 21.5  Hip adduction    Hip internal rotation    Hip external rotation    Knee flexion 38.7 24.9  Knee extension 31.3 28.1   (Blank rows = not tested)     FUNCTIONAL TESTS:  30s chair stand: 5.5 wihtout UEs SLS: bil 2-3 s with ability to control balance correction without UEs   TREATMENT:                                                                                                                                Treatment                            5/15:  Manual HSS R  STM R lumbar ps/glutes in sidelying LTR x15ea Hooklying PPT 2x10  Supine march 2x10 Supine clam with GTB 2x10 Seated QL stretch (sitting on corner of plinth Review of diaphragmatic breathing with exercises   Treatment                            5/8:  Trigger Point Dry Needling, Manual Therapy Treatment:  Initial or subsequent education regarding Trigger Point Dry Needling: Initial Did patient give consent to treatment with Trigger Point Dry Needling: Yes TPDN with skilled palpation and monitoring followed by STM to the following muscles: Rt parapsinals T12-L5, Rt glut max/med/min  moving lateral from SIJ  SIJ mobs PA and inf on Rt upper quadrant Seated hp hinge Qped rocking    DATE: 07/25/22  Manual HSS R STM R lumbar ps/glutes prone  LTR x15ea Hooklying PPT 2x10  Quadruped cat/cow- 5sec x10 Supine march 2x10 Supine clam with GTB 2x10   PATIENT EDUCATION:  Education details:  re-acquainting with aquatic therapy  Person educated: Patient Education method: Explanation, Demonstration, Tactile cues, Verbal cues, Education comprehension: verbalized understanding, returned demonstration, verbal cues required, tactile cues required, and needs further education  HOME EXERCISE PROGRAM: [id number]   ASSESSMENT:  CLINICAL IMPRESSION: Spent time on manual intervention today to decreased continued restrictions in R hip and lumbar area. She complaints of a lot of pain over most lateral incision area which occurs with most movements and exercises. Pt will benefit from another session of DN since she had positive response last session. May try cupping as well.    OBJECTIVE IMPAIRMENTS: Abnormal gait, decreased activity tolerance, decreased balance, difficulty walking, decreased strength, impaired perceived functional ability, increased muscle spasms, impaired flexibility, improper body mechanics, postural dysfunction, and pain.   ACTIVITY LIMITATIONS: carrying, lifting, bending, sitting, standing, squatting, stairs, transfers, bathing, dressing, locomotion level, and caring for others  PARTICIPATION LIMITATIONS: meal prep, cleaning, laundry, shopping, community activity, occupation, and yard work  PERSONAL FACTORS: Time since onset of injury/illness/exacerbation and anxiety, h/o c-section  are also affecting patient's functional outcome.   REHAB POTENTIAL: Good  CLINICAL DECISION MAKING: Evolving/moderate complexity  EVALUATION COMPLEXITY: Moderate   GOALS: Goals reviewed with patient? Yes  SHORT TERM GOALS: Target date: 4/19  Pt will be compliant with daily  HEP performance Baseline: began establishing at eval Goal status: IN PROGRESS  2.  Avoiding sustained seated positions greater than 45 min when able Baseline: reports pain with 1.5 hr of sitting and encouraged her to avoid greater than 45 min throughout the day Goal status: INITIAL   LONG TERM GOALS: Target date: POC date  LEFS to improve by MDC Baseline: 9 point MDC Goal status: INITIAL  2.  Able to walk through [facility] without feeling shaky Baseline: she feels like this is her most visited store as she orders groceries online Goal status: INITIAL  3.  Gross MMT to improve by 10% Baseline: see obj Goal status: INITIAL  4.  Pt will verbalize reduction in sensation of back "needing to pop" Baseline: constant at eval Goal status: INITIAL    PLAN:  PT FREQUENCY: 1x/week  PT DURATION: 12 weeks  PLANNED INTERVENTIONS: Therapeutic exercises, Therapeutic activity, Neuromuscular re-education, Balance training, Gait training, Patient/Family education, Self Care, Joint mobilization, Stair training, Aquatic Therapy, Dry Needling, Spinal mobilization, Cryotherapy, Moist heat, Taping, Ionotophoresis 4mg /ml Dexamethasone, Manual therapy, and Re-evaluation.  PLAN FOR NEXT SESSION: Continue dry needling as needed, right SI joint mobilizations   [employer],  PTA  08/07/22 10:17 AM  [facility] [address] [place], [place], [address] Phone: [phone]   Fax:  [phone]     Check all possible CPT codes: [phone] - PT Re-evaluation, 97110- Therapeutic Exercise, [phone]- Neuro Re-education, [phone] - Gait Training, [phone] - Manual Therapy, [phone] - Therapeutic Activities, [phone] - Self Care, [phone] - Iontophoresis, and [id number] - Aquatic therapy    Check all conditions that are expected to impact treatment: Musculoskeletal disorders   If treatment provided at initial evaluation, no treatment charged due to lack of authorization.

## 2022-08-12 ENCOUNTER — Ambulatory Visit (HOSPITAL_BASED_OUTPATIENT_CLINIC_OR_DEPARTMENT_OTHER): Payer: Medicaid Other

## 2022-08-12 ENCOUNTER — Encounter (HOSPITAL_BASED_OUTPATIENT_CLINIC_OR_DEPARTMENT_OTHER): Payer: Self-pay

## 2022-08-12 DIAGNOSIS — R262 Difficulty in walking, not elsewhere classified: Secondary | ICD-10-CM

## 2022-08-12 DIAGNOSIS — M6281 Muscle weakness (generalized): Secondary | ICD-10-CM

## 2022-08-12 DIAGNOSIS — M5459 Other low back pain: Secondary | ICD-10-CM

## 2022-08-12 NOTE — Therapy (Signed)
OUTPATIENT PHYSICAL THERAPY EVALUATION   Patient Name: Ellen Booker MRN: [id number] DOB:Nov 11, 1980, 42 y.o., female Today's Date: 08/12/2022  END OF SESSION:  PT End of Session - 08/12/22 1030     Visit Number 9    Number of Visits 13    Date for PT Re-Evaluation 09/13/22    Authorization Type [place] [employer]    Authorization - Number of Visits 27    PT Start Time 1015    PT Stop Time 1100    PT Time Calculation (min) 45 min    Activity Tolerance Patient tolerated treatment well    Behavior During Therapy WFL for tasks assessed/performed                 Past Medical History:  Diagnosis Date   Abdominal wall mass    left side   Anemia    Anxiety    Depression    History of Helicobacter pylori infection    per pt 2016   Renal cyst, right    per CT 04-04-2016   Vaginal Pap smear, abnormal    Wears glasses    Past Surgical History:  Procedure Laterality Date   back  08/2021   CESAREAN SECTION  2010   COLONOSCOPY  last one 06-19-2015   EXCISION ABDOMINAL WALL MASS  2012   right side   EXCISION MASS ABDOMINAL N/A 05/21/2016   Procedure: EXCISION ABDOMINAL WALL MASS;  Surgeon: Rodman Pickle, MD;  Location: [facility];  Service: General;  Laterality: N/A;   RESECTION OF ABDOMINAL MASS     Patient Active Problem List   Diagnosis Date Noted   Dysmenorrhea 02/01/2014   Tobacco dependency 02/01/2014   Obesity 12/09/2013   Essential hypertension, benign 12/09/2013   Candidiasis of vulva and vagina 12/08/2013   Bilateral nipple discharge 11/08/2013   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   Unspecified disorder of menstruation and other abnormal bleeding from female genital tract 11/08/2013   Irregular menstrual cycle 11/08/2013   UTI (lower urinary tract infection) 01/29/2013   HSV (herpes simplex virus) infection 01/26/2013   Well woman exam with routine gynecological exam 01/26/2013   BMI 40.0-44.9, adult (HCC) 01/26/2013   Elevated  BP 01/26/2013    REFERRING PROVIDER:  Jonetta Speak, NP   REFERRING DIAG:  Diagnosis  Z98.1 (ICD-10-CM) - Arthrodesis status  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 Tried tennis ball at home. "I have so many knots! It was intense but I think it was helpful." She report her dog grabbed the tennis ball and when she tried to grab it from him she fell. She is not injured but had some back pain after this. "It feels like I need to crack my back now."  PERTINENT HISTORY:  Anxiety, h/o c-section  PAIN:  Are you having pain? Yes: NPRS scale: 5/10 Pain location: middle of lower back Pain description: sharp pain to the right lower back where it feels like a pull with a long stride Aggravating factors: long stride Relieving factors: pain meds, laying on left side.   PRECAUTIONS: None  WEIGHT BEARING RESTRICTIONS: No  FALLS:  Has patient fallen in last 6 months? Yes. Number of falls 1  LIVING ENVIRONMENT: Lives with: lives with an adult companion Lives in: House/apartment Stairs: yes Has following equipment at home: Single point cane and Environmental consultant - 4 wheeled  OCCUPATION: not working  PLOF: Independent with basic ADLs  PATIENT GOALS: lift leg for more than a couple of seconds without shaking, regain strength, relieve sensation that back needs to pop   OBJECTIVE:   DIAGNOSTIC FINDINGS:  MRI 09/30/20: IMPRESSION: Status post L5-S1  decompression. A large right paracentral and subarticular recess disc protrusion impinges on the right S1 root and deforms the right side of the thecal sac. There is also moderate right foraminal narrowing at this level.  Mild central canal and bilateral foraminal narrowing L4-5.  Xray lumbar completed 04/22/22.  PATIENT SURVEYS:  EVAL: LEFS: 21/80  COGNITIVE STATUS: Within functional limits for tasks assessed   POSTURE:  Eval: bil genu valgus and pronated through feet  GAIT: No AD at eval with slow cadence, short step length Comments: discussed trekking poles vs [facility]   Body Part #1 Lumbar   Lumbar ROM LUMBAR ROM:   Active  A/PROM  eval  Flexion Knee joint line  Extension   Right lateral flexion Joint line  Left lateral flexion Joint line  Right rotation   Left rotation    (Blank rows = not tested)       Body Part #2 Hip//knee   LE MMT LOWER EXTREMITY MMT:    MMT Right eval Left eval  Hip flexion 19.7 18.8  Hip extension    Hip abduction 28.5 21.5  Hip adduction    Hip internal rotation    Hip external rotation    Knee flexion 38.7 24.9  Knee extension 31.3 28.1   (Blank rows = not tested)     FUNCTIONAL TESTS:  30s chair stand: 5.5 wihtout UEs SLS: bil 2-3 s with ability to control balance correction without UEs   TREATMENT:  Treatment                            5/20:  Manual HSS R Manual Piriformis stretch Cupping- static technique- prone lying-6 cups to low back- Gliding technique to lower left lumbar paraspinals STM R lumbar ps/glutes in sidelying LTR x15ea Hooklying PPT 2x10 5" hold Supine march 1x10 (some pain) Supine clam with GTB 2x10   Treatment                            5/15:  Manual HSS R STM R lumbar ps/glutes in sidelying LTR x15ea Hooklying PPT 2x10  Supine march 2x10 Supine clam with GTB  2x10 Seated QL stretch (sitting on corner of plinth Review of diaphragmatic breathing with exercises   Treatment                            5/8:  Trigger Point Dry Needling, Manual Therapy Treatment:  Initial or subsequent education regarding Trigger Point Dry Needling: Initial Did patient give consent to treatment with Trigger Point Dry Needling: Yes TPDN with skilled palpation and monitoring followed by STM to the following muscles: Rt parapsinals T12-L5, Rt glut max/med/min moving lateral from SIJ  SIJ mobs PA and inf on Rt upper quadrant Seated hp hinge Qped rocking   PATIENT EDUCATION:  Education details:  re-acquainting with aquatic therapy  Person educated: Patient Education method: Explanation, Demonstration, Tactile cues, Verbal cues, Education comprehension: verbalized understanding, returned demonstration, verbal cues required, tactile cues required, and needs further education  HOME EXERCISE PROGRAM: [id number]   ASSESSMENT:  CLINICAL IMPRESSION: Pt guarded with manual stretching, described feeling pain in low back with all movements. Pt reports she has been more aware of trying to relax with exercises as well as tying to utilize breathing techniques. Trialled cupping today to address continued soft tissue restrictions. Pt did report improvement at end of session after cupping. She is scheduled for DN again next visit.    OBJECTIVE IMPAIRMENTS: Abnormal gait, decreased activity tolerance, decreased balance, difficulty walking, decreased strength, impaired perceived functional ability, increased muscle spasms, impaired flexibility, improper body mechanics, postural dysfunction, and pain.   ACTIVITY LIMITATIONS: carrying, lifting, bending, sitting, standing, squatting, stairs, transfers, bathing, dressing, locomotion level, and caring for others  PARTICIPATION LIMITATIONS: meal prep, cleaning, laundry, shopping, community activity, occupation, and yard work  PERSONAL  FACTORS: Time since onset of injury/illness/exacerbation and anxiety, h/o c-section  are also affecting patient's functional outcome.   REHAB POTENTIAL: Good  CLINICAL DECISION MAKING: Evolving/moderate complexity  EVALUATION COMPLEXITY: Moderate   GOALS: Goals reviewed with patient? Yes  SHORT TERM GOALS: Target date: 4/19  Pt will be compliant with daily HEP performance Baseline: began establishing at eval Goal status: IN PROGRESS  2.  Avoiding sustained seated positions greater than 45 min when able Baseline: reports pain with 1.5 hr of sitting and encouraged her to avoid greater than 45 min throughout the day Goal status: INITIAL   LONG TERM GOALS: Target date: POC date  LEFS to improve by MDC Baseline: 9 point MDC Goal status: INITIAL  2.  Able to walk through [facility] without feeling shaky Baseline: she feels like this is her most visited store as she orders groceries online Goal status: INITIAL  3.  Gross MMT to improve by 10% Baseline: see obj Goal status: INITIAL  4.  Pt will verbalize  reduction in sensation of back "needing to pop" Baseline: constant at eval Goal status: INITIAL    PLAN:  PT FREQUENCY: 1x/week  PT DURATION: 12 weeks  PLANNED INTERVENTIONS: Therapeutic exercises, Therapeutic activity, Neuromuscular re-education, Balance training, Gait training, Patient/Family education, Self Care, Joint mobilization, Stair training, Aquatic Therapy, Dry Needling, Spinal mobilization, Cryotherapy, Moist heat, Taping, Ionotophoresis 4mg /ml Dexamethasone, Manual therapy, and Re-evaluation.  PLAN FOR NEXT SESSION: Continue dry needling as needed, right SI joint mobilizations   Riki Altes, PTA  08/12/22 11:35 AM  [facility] [address] [place], [place], [address] Phone: [phone]   Fax:  [phone]     Check all possible CPT codes: [id number] - PT Re-evaluation, 97110- Therapeutic Exercise,  [phone]- Neuro Re-education, [phone] - Gait Training, [phone] - Manual Therapy, [phone] - Therapeutic Activities, [phone] - Self Care, [phone] - Iontophoresis, and [id number] - Aquatic therapy    Check all conditions that are expected to impact treatment: Musculoskeletal disorders   If treatment provided at initial evaluation, no treatment charged due to lack of authorization.

## 2022-08-13 ENCOUNTER — Ambulatory Visit (HOSPITAL_BASED_OUTPATIENT_CLINIC_OR_DEPARTMENT_OTHER): Payer: Medicaid Other | Admitting: Physical Therapy

## 2022-08-13 ENCOUNTER — Ambulatory Visit: Payer: Medicaid Other | Admitting: Gastroenterology

## 2022-08-13 ENCOUNTER — Encounter: Payer: Self-pay | Admitting: Gastroenterology

## 2022-08-13 VITALS — BP 118/68 | HR 78 | Ht 64.0 in | Wt 222.0 lb

## 2022-08-13 DIAGNOSIS — R1314 Dysphagia, pharyngoesophageal phase: Secondary | ICD-10-CM

## 2022-08-13 NOTE — Progress Notes (Addendum)
[facility] Consult Note:  History: Ellen Booker 08/13/2022  Referring provider: Nathaneil Canary, PA-C  Reason for consult/chief complaint: Dysphagia (Pt state having trouble getting food down. Pt state it like she forget to swallow.)   Subjective  HPI: I saw [name] in 2017 and 2018 for lower abdominal pain, constipation and bloating.  Not much improvement on Linzess, unrevealing colonoscopy.  At last visit had been seen by gynecology and reportedly had a pelvic ultrasound.  Had a history of STI and may have had same ongoing at that time.  CT abdomen pelvis January 2018 with result below, subsequently seen by gynecology, then gynecologic oncology, then general surgery.  Had excision of endometrial lesions.  At this time, she is referred back by her primary care provider for dysphagia.  [name] tells me that for about the last 6 months, she has had intermittent dysphagia to solids or liquids.  It might occur every couple of weeks without warning, and things will feel immediately stuck in the throat and she brings them back up.  She has more frequent and just about daily difficulty getting pills to go down.  She does not feel as if things get hung up in the lower chest. Denies abdominal pain or weight loss.  Smoker  ROS:  Review of Systems  Constitutional:  Negative for appetite change and unexpected weight change.  HENT:  Negative for mouth sores and voice change.   Eyes:  Negative for pain and redness.  Respiratory:  Negative for cough and shortness of breath.   Cardiovascular:  Negative for chest pain and palpitations.  Genitourinary:  Negative for dysuria and hematuria.  Musculoskeletal:  Negative for arthralgias and myalgias.  Skin:  Negative for pallor and rash.  Neurological:  Negative for weakness and headaches.       Lower extremity numbness and weakness since a back surgery   Hematological:  Negative for adenopathy.     Past Medical  History: Past Medical History:  Diagnosis Date   Abdominal wall mass    left side   Anemia    Anxiety    Depression    Elevated cholesterol    GERD (gastroesophageal reflux disease)    History of Helicobacter pylori infection    per pt 2016   Renal cyst, right    per CT 04-04-2016   Vaginal Pap smear, abnormal    Wears glasses      Past Surgical History: Past Surgical History:  Procedure Laterality Date   back  08/2021   CESAREAN SECTION  2010   COLONOSCOPY  last one 06-19-2015   EXCISION ABDOMINAL WALL MASS  2012   right side   EXCISION MASS ABDOMINAL N/A 05/21/2016   Procedure: EXCISION ABDOMINAL WALL MASS;  Surgeon: De Blanch Kinsinger, MD;  Location: [facility];  Service: General;  Laterality: N/A;   RESECTION OF ABDOMINAL MASS       Family History: Family History  Problem Relation Age of Onset   Uterine cancer Mother    HIV Father    Cancer Maternal Grandmother    Uterine cancer Other    Liver cancer Neg Hx    Esophageal cancer Neg Hx    Colon cancer Neg Hx     Social History: Social History   Socioeconomic History   Marital status: Significant Other    Spouse name: Not on file   Number of children: 1   Years of education: Not on file   Highest education level:  Not on file  Occupational History   Occupation: unempolyed  Tobacco Use   Smoking status: Every Day    Packs/day: 0.50    Years: 19.00    Additional pack years: 0.00    Total pack years: 9.50    Types: Cigarettes   Smokeless tobacco: Never  Vaping Use   Vaping Use: Never used  Substance and Sexual Activity   Alcohol use: No    Comment: occ   Drug use: Yes    Types: Marijuana   Sexual activity: Yes    Partners: Male    Birth control/protection: None  Other Topics Concern   Not on file  Social History Narrative   Are you right handed or left handed? right   Are you currently employed ? no   What is your current occupation?   Do you live at home alone? With  daughter and significant other   Who lives with you?    What type of home do you live in: 1 story or 2 story?        Social Determinants of Health   Financial Resource Strain: Not on file  Food Insecurity: Not on file  Transportation Needs: Not on file  Physical Activity: Not on file  Stress: Not on file  Social Connections: Not on file    Allergies: No Known Allergies  Outpatient Meds: Current Outpatient Medications  Medication Sig Dispense Refill   buPROPion (WELLBUTRIN XL) 150 MG 24 hr tablet Take 150 mg by mouth daily.     lamoTRIgine (LAMICTAL) 150 MG tablet Take 150 mg by mouth 2 (two) times daily.     METFORMIN & DIET MANAGE PROD PO      oxyCODONE-acetaminophen (PERCOCET/ROXICET) 5-325 MG tablet Take 1 tablet by mouth every 4 (four) hours as needed.     rosuvastatin (CRESTOR) 10 MG tablet Take by mouth.     tiZANidine (ZANAFLEX) 4 MG tablet Take 1 tablet (4 mg total) by mouth every 6 (six) hours as needed for muscle spasms. 30 tablet 3   topiramate (TOPAMAX) 50 MG tablet Take 50 mg by mouth 2 (two) times daily.     traZODone (DESYREL) 100 MG tablet Take 100 mg by mouth at bedtime.     TRINTELLIX 10 MG TABS tablet Take 20 mg by mouth once.     varenicline (CHANTIX) 0.5 MG tablet Take 0.5 mg by mouth 2 (two) times daily.     VRAYLAR 3 MG capsule Take 3 mg by mouth daily.     VYVANSE 50 MG capsule Take 50 mg by mouth every morning.     No current facility-administered medications for this visit.      ___________________________________________________________________ Objective   Exam:  BP 118/68   Pulse 78   Ht 5\' 4"  (1.626 m)   Wt 222 lb (100.7 kg)   BMI 38.11 kg/m  Wt Readings from Last 3 Encounters:  08/13/22 222 lb (100.7 kg)  05/23/22 217 lb (98.4 kg)  04/22/22 211 lb (95.7 kg)    General: Well-appearing, normal vocal quality Eyes: sclera anicteric, no redness ENT: oral mucosa moist without lesions, no cervical or supraclavicular lymphadenopathy CV:  Regular without appreciable murmur, no JVD, no peripheral edema Resp: clear to auscultation bilaterally, normal RR and effort noted GI: soft, no tenderness, with active bowel sounds. No guarding or palpable organomegaly noted. Skin; warm and dry, no rash or jaundice noted Neuro: Gets on exam table slowly but without assistance.  No gross motor deficits  Labs:  Latest Ref Rng & Units 05/23/2022    9:01 AM 11/21/2017    1:31 PM 10/02/2016    6:12 AM  CBC  WBC 3.4 - 10.8 x10E3/uL 10.4  10.8  9.5   Hemoglobin 11.1 - 15.9 g/dL 16.1  9.5  09.6   Hematocrit 34.0 - 46.6 % 38.8  31.1  35.1   Platelets 150 - 450 x10E3/uL 391  455  381       Latest Ref Rng & Units 11/21/2017    1:31 PM 07/22/2016    9:33 AM 06/17/2016   10:44 PM  CMP  Glucose 70 - 99 mg/dL 045  409  94   BUN 6 - 20 mg/dL 6  7  9    Creatinine 0.44 - 1.00 mg/dL 8.11  9.14  7.82   Sodium 135 - 145 mmol/L 138  137  139   Potassium 3.5 - 5.1 mmol/L 3.6  3.8  4.0   Chloride 98 - 111 mmol/L 106  107  107   CO2 22 - 32 mmol/L 25  23  28    Calcium 8.9 - 10.3 mg/dL 8.8  9.0  9.3   Total Protein 6.5 - 8.1 g/dL   6.8   Total Bilirubin 0.3 - 1.2 mg/dL   0.4   Alkaline Phos 38 - 126 U/L   65   AST 15 - 41 U/L   15   ALT 14 - 54 U/L   14      Radiologic Studies:  CLINICAL DATA:  42 year old female with a history of 2 months of mid abdominal pain.   EXAM: CT ABDOMEN AND PELVIS WITH CONTRAST   TECHNIQUE: Multidetector CT imaging of the abdomen and pelvis was performed using the standard protocol following bolus administration of intravenous contrast.   CONTRAST:  ISOVUE-300 IOPAMIDOL (ISOVUE-300) INJECTION 61%   COMPARISON:  CT 06/18/2016, 04/04/2016   FINDINGS: Lower chest: No acute abnormality.   Hepatobiliary: Unremarkable appearance of liver. Unremarkable gallbladder.   Pancreas: Unremarkable pancreas   Spleen: Unremarkable spleen   Adrenals/Urinary Tract: Unremarkable appearance of the  adrenal glands.   Bosniak 1 cyst of the right kidney.  Unremarkable left kidney.   No hydronephrosis or nephrolithiasis of the left or the right.   Stomach/Bowel: Unremarkable stomach. Unremarkable small bowel with no abnormal distention. Normal appendix. No significant stool burden. Mild diverticular changes without associated inflammatory changes.   Vascular/Lymphatic: No significant atherosclerotic changes. No aneurysm. No periaortic fluid. Proximal femoral vasculature patent. No lymphadenopathy.   Reproductive: Presumably physiologic changes of the bilateral at adnexa. Fluid in the endometrial canal.   Other: Rim enhancing fluid collection associated with the anterior abdominal wall musculature measuring 5.5 cm x 3.3 cm on the axial images. This is significantly decreased in size when compared to the prior CT when the largest diameter measured approximately 12 cm. Mild infiltration the adjacent fat. Fluid does not appear to extend into the abdomen. There does not appear to be bowel herniating through the abdominal wall musculature.   Musculoskeletal: No displaced fracture. No significant degenerative changes. No bony canal narrowing.   IMPRESSION: Compare to the prior CT there is significantly decreased size of rim enhancing fluid collection associated with the low anterior abdominal wall musculature, with prior diameter approximately 12 cm and current diameter 5.5 cm. Differential diagnosis includes resolving seroma, hematoma, or potentially infection. Given the reported history of a prior surgical excision of mass at this location, would recommend correlation with prior pathology result if there were concern  for malignancy.     Electronically Signed   By: Gilmer Mor D.O.   On: 07/22/2016 12:14  ______________________________  CLINICAL DATA:  Left lower quadrant pain, mid lower pelvic pain for about 6 months, abdominal bloating   EXAM: CT ABDOMEN AND PELVIS  WITH CONTRAST   TECHNIQUE: Multidetector CT imaging of the abdomen and pelvis was performed using the standard protocol following bolus administration of intravenous contrast.   CONTRAST:  ISOVUE-300 IOPAMIDOL (ISOVUE-300) INJECTION 61%   COMPARISON:  None.   FINDINGS: Lower chest: Lung bases shows no acute findings.   Hepatobiliary: No focal liver abnormality is seen. No gallstones, gallbladder wall thickening, or biliary dilatation.   Pancreas: Unremarkable. No pancreatic ductal dilatation or surrounding inflammatory changes.   Spleen: Normal in size without focal abnormality.   Adrenals/Urinary Tract: No adrenal gland mass. Enhanced kidneys are symmetrical in size. There is a cyst in upper pole of the right kidney measures 4.5 cm. No hydronephrosis or hydroureter.   The urinary bladder is under distended grossly unremarkable.   Stomach/Bowel: There is no small bowel obstruction. No gastric outlet obstruction. Oral contrast material was given to the patient.   No thickened or dilated small bowel loops. Normal appendix is noted in axial image 64. No pericecal inflammation. The terminal ileum is unremarkable. No distal colitis or diverticulitis. No distal colonic obstruction.   Vascular/Lymphatic: No retroperitoneal or mesenteric adenopathy. Abdominal aorta is unremarkable. No aortic aneurysm.   Reproductive: The uterus is anteflexed normal size. The right ovary is normal. There is a follicle within left ovary measures 2 cm.   Other: No ascites or free abdominal air.   In axial image 65 there is somewhat irregular soft tissue nodule in left anterior deep pelvic wall just medial/ above the left rectus muscle measures about 3.2 cm. This is best seen in sagittal image 73. There is mild stranding of adjacent subcutaneous fat. This is suspicious for ectopic endometrioma, cheloid or desmoid tumor. Clinical correlation is necessary. Further correlation with  tissue biopsy is suggested as clinically warranted.   Musculoskeletal: No destructive bony lesions are noted. Sagittal images of the spine shows mild degenerative changes lower thoracic spine. No inguinal adenopathy.   IMPRESSION: 1. In axial image 65 there is somewhat irregular soft tissue nodule in left anterior deep pelvic wall just medial/ above the left rectus muscle measures about 3.2 cm. This is best seen in sagittal image 73. There is mild stranding of adjacent subcutaneous fat. This is suspicious for ectopic endometrioma, cheloid or desmoid tumor. Clinical correlation is necessary. Further correlation with tissue biopsy is suggested as clinically warranted. 2. Normal appendix.  No pericecal inflammation. 3. There is a cyst in upper pole the right kidney measures 4.5 cm. 4. No small bowel or colonic obstruction. 5. Normal anteflexed uterus. There is a follicle within left ovary measures 2 cm. Normal right ovary. No pelvic adenopathy.     Electronically Signed   By: Natasha Mead M.D.   On: 04/04/2016 15:46   Assessment: Encounter Diagnosis  Name Primary?   Dysphagia, pharyngoesophageal phase Yes    About 6 months of intermittent dysphagia with solids or liquids feel hung up in the neck and she may bring them back up.  More frequent and almost daily symptoms of this related to pills.  Not clear if proximal esophageal stricture or web versus motility condition.  Does not sound typical for achalasia. She is also a smoker, so laryngeal/pharyngeal lesions must be considered.  Plan:  Upper endoscopy with possible dilation.  She was agreeable after discussion of procedure and risks  The benefits and risks of the planned procedure were described in detail with the patient or (when appropriate) their health care proxy.  Risks were outlined as including, but not limited to, bleeding, infection, perforation, adverse medication reaction leading to cardiac or pulmonary decompensation,  pancreatitis (if ERCP).  The limitation of incomplete mucosal visualization was also discussed.  No guarantees or warranties were given.  If unrevealing, barium esophagram to follow  If hypopharynx and larynx not well-visualized on EGD, refer to ENT to rule out neoplasia or vocal cord dysfunction.  Thank you for the courtesy of this consult.  Please call me with any questions or concerns.  Charlie Pitter III  CC: Referring provider noted above  Addendum 08/13/2022  See patient portal message no after today's office visit indicating she has had a 50 pound unintentional weight loss.   -  - Amada Jupiter, MD    [facility]

## 2022-08-13 NOTE — Patient Instructions (Signed)
_______________________________________________________  If your blood pressure at your visit was 140/90 or greater, please contact your primary care physician to follow up on this.  _______________________________________________________  If you are age 41 or older, your body mass index should be between 23-30. Your Body mass index is 38.11 kg/m. If this is out of the aforementioned range listed, please consider follow up with your Primary Care Provider.  If you are age 64 or younger, your body mass index should be between 19-25. Your Body mass index is 38.11 kg/m. If this is out of the aformentioned range listed, please consider follow up with your Primary Care Provider.   ________________________________________________________  The [facility] providers would like to encourage you to use MYCHART to communicate with providers for non-urgent requests or questions.  Due to long hold times on the telephone, sending your provider a message by MYCHART may be a faster and more efficient way to get a response.  Please allow 48 business hours for a response.  Please remember that this is for non-urgent requests.  _______________________________________________________   You have been scheduled for an endoscopy. Please follow written instructions given to you at your visit today. If you use inhalers (even only as needed), please bring them with you on the day of your procedure.   It was a pleasure to see you today!  Thank you for trusting me with your gastrointestinal care!     

## 2022-08-14 ENCOUNTER — Ambulatory Visit (HOSPITAL_BASED_OUTPATIENT_CLINIC_OR_DEPARTMENT_OTHER): Payer: Medicaid Other | Admitting: Physical Therapy

## 2022-08-14 ENCOUNTER — Encounter (HOSPITAL_BASED_OUTPATIENT_CLINIC_OR_DEPARTMENT_OTHER): Payer: Self-pay | Admitting: Physical Therapy

## 2022-08-14 DIAGNOSIS — M6281 Muscle weakness (generalized): Secondary | ICD-10-CM | POA: Diagnosis not present

## 2022-08-14 DIAGNOSIS — M5459 Other low back pain: Secondary | ICD-10-CM

## 2022-08-14 DIAGNOSIS — R262 Difficulty in walking, not elsewhere classified: Secondary | ICD-10-CM

## 2022-08-14 NOTE — Therapy (Signed)
OUTPATIENT PHYSICAL THERAPY EVALUATION   Patient Name: Ellen Booker MRN: [id number] DOB:01/18/1981, 42 y.o., female Today's Date: 08/14/2022  END OF SESSION:        Past Medical History:  Diagnosis Date   Abdominal wall mass    left side   Anemia    Anxiety    Depression    Elevated cholesterol    GERD (gastroesophageal reflux disease)    History of Helicobacter pylori infection    per pt 2016   Renal cyst, right    per CT 04-04-2016   Vaginal Pap smear, abnormal    Wears glasses    Past Surgical History:  Procedure Laterality Date   back  08/2021   CESAREAN SECTION  2010   COLONOSCOPY  last one 06-19-2015   EXCISION ABDOMINAL WALL MASS  2012   right side   EXCISION MASS ABDOMINAL N/A 05/21/2016   Procedure: EXCISION ABDOMINAL WALL MASS;  Surgeon: Rodman Pickle, MD;  Location: [facility];  Service: General;  Laterality: N/A;   RESECTION OF ABDOMINAL MASS     Patient Active Problem List   Diagnosis Date Noted   Dysmenorrhea 02/01/2014   Tobacco dependency 02/01/2014   Obesity 12/09/2013   Essential hypertension, benign 12/09/2013   Candidiasis of vulva and vagina 12/08/2013   Bilateral nipple discharge 11/08/2013   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   Unspecified disorder of menstruation and other abnormal bleeding from female genital tract 11/08/2013   Irregular menstrual cycle 11/08/2013   UTI (lower urinary tract infection) 01/29/2013   HSV (herpes simplex virus) infection 01/26/2013   Well woman exam with routine gynecological exam 01/26/2013   BMI 40.0-44.9, adult (HCC) 01/26/2013   Elevated BP 01/26/2013    REFERRING PROVIDER:  Jonetta Speak, NP   REFERRING DIAG:  Diagnosis  Z98.1 (ICD-10-CM) - Arthrodesis status  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  ONSET DATE: chronic   SUBJECTIVE:                                                                                                                                                                                           SUBJECTIVE STATEMENT:  Pt reports 5/10 pain level at entry. Tried tennis ball at home. "I have so many knots! It was intense but I think it was helpful." She report her dog grabbed the tennis ball and when she tried to grab it from him she fell. She is not injured but had some back  pain after this. "It feels like I need to crack my back now."  PERTINENT HISTORY:  Anxiety, h/o c-section  PAIN:  Are you having pain? Yes: NPRS scale: 5/10 Pain location: middle of lower back Pain description: sharp pain to the right lower back where it feels like a pull with a long stride Aggravating factors: long stride Relieving factors: pain meds, laying on left side.   PRECAUTIONS: None  WEIGHT BEARING RESTRICTIONS: No  FALLS:  Has patient fallen in last 6 months? Yes. Number of falls 1  LIVING ENVIRONMENT: Lives with: lives with an adult companion Lives in: House/apartment Stairs: yes Has following equipment at home: Single point cane and Environmental consultant - 4 wheeled  OCCUPATION: not working  PLOF: Independent with basic ADLs  PATIENT GOALS: lift leg for more than a couple of seconds without shaking, regain strength, relieve sensation that back needs to pop   OBJECTIVE:   DIAGNOSTIC FINDINGS:  MRI 09/30/20: IMPRESSION: Status post L5-S1 decompression. A large right paracentral and subarticular recess disc protrusion impinges on the right S1 root and deforms the right side of the thecal sac. There is also moderate right foraminal narrowing at this level.  Mild central canal and bilateral foraminal narrowing L4-5.  Xray lumbar completed 04/22/22.  PATIENT SURVEYS:  EVAL: LEFS: 21/80  COGNITIVE STATUS: Within functional limits for tasks  assessed   POSTURE:  Eval: bil genu valgus and pronated through feet  GAIT: No AD at eval with slow cadence, short step length Comments: discussed trekking poles vs [facility]   Body Part #1 Lumbar   Lumbar ROM LUMBAR ROM:   Active  A/PROM  eval  Flexion Knee joint line  Extension   Right lateral flexion Joint line  Left lateral flexion Joint line  Right rotation   Left rotation    (Blank rows = not tested)       Body Part #2 Hip//knee   LE MMT LOWER EXTREMITY MMT:    MMT Right eval Left eval  Hip flexion 19.7 18.8  Hip extension    Hip abduction 28.5 21.5  Hip adduction    Hip internal rotation    Hip external rotation    Knee flexion 38.7 24.9  Knee extension 31.3 28.1   (Blank rows = not tested)     FUNCTIONAL TESTS:  30s chair stand: 5.5 wihtout UEs SLS: bil 2-3 s with ability to control balance correction without UEs   TREATMENT:                                                                                                                              5/22  Trigger Point Dry-Needling  Treatment instructions: Expect mild to moderate muscle soreness. S/S of pneumothorax if dry needled over a lung field, and to seek immediate medical attention should they occur. Patient verbalized understanding of these instructions and education.  Patient Consent Given: Yes Education handout provided: Yes Muscles treated: bilateral gluteals 2 spots  each using a .75x30 needle  Electrical stimulation performed: No Parameters: N/A Treatment response/outcome: great twtich   Manual: skilled palpation of trigger points. ; STM to gluteals and lower lumbar spine   Reviewed HEP and how to use it:   LTR x15   Gluteal stretch 2x20 sec hold   Supine march 2x10 each leg with cueing for TA breathing  Supine PPT x15 with cuing for TA breathing       Treatment                            5/20:  Manual HSS R Manual Piriformis stretch Cupping- static technique- prone  lying-6 cups to low back- Gliding technique to lower left lumbar paraspinals STM R lumbar ps/glutes in sidelying LTR x15ea Hooklying PPT 2x10 5" hold Supine march 1x10 (some pain) Supine clam with GTB 2x10   Treatment                            5/15:  Manual HSS R STM R lumbar ps/glutes in sidelying LTR x15ea Hooklying PPT 2x10  Supine march 2x10 Supine clam with GTB 2x10 Seated QL stretch (sitting on corner of plinth Review of diaphragmatic breathing with exercises   Treatment                            5/8:  Trigger Point Dry Needling, Manual Therapy Treatment:  Initial or subsequent education regarding Trigger Point Dry Needling: Initial Did patient give consent to treatment with Trigger Point Dry Needling: Yes TPDN with skilled palpation and monitoring followed by STM to the following muscles: Rt parapsinals T12-L5, Rt glut max/med/min moving lateral from SIJ  SIJ mobs PA and inf on Rt upper quadrant Seated hp hinge Qped rocking   PATIENT EDUCATION:  Education details:  re-acquainting with aquatic therapy  Person educated: Patient Education method: Explanation, Demonstration, Tactile cues, Verbal cues, Education comprehension: verbalized understanding, returned demonstration, verbal cues required, tactile cues required, and needs further education  HOME EXERCISE PROGRAM: [id number]   ASSESSMENT:  CLINICAL IMPRESSION: The patient had significant spasming in bilateral gluteals today. Therapy performed trial of TPDN . She had great twitch response. We alos performed manual therapy to that area. We gave her stretching to stretch the gluteal for tomorrow if she gets post needle soreness. We reviewed her HEP. We educated her on the difference betweeen soft tisuse mobilization and stretching v exercises. We expanded her supine program    OBJECTIVE IMPAIRMENTS: Abnormal gait, decreased activity tolerance, decreased balance, difficulty walking, decreased strength, impaired  perceived functional ability, increased muscle spasms, impaired flexibility, improper body mechanics, postural dysfunction, and pain.   ACTIVITY LIMITATIONS: carrying, lifting, bending, sitting, standing, squatting, stairs, transfers, bathing, dressing, locomotion level, and caring for others  PARTICIPATION LIMITATIONS: meal prep, cleaning, laundry, shopping, community activity, occupation, and yard work  PERSONAL FACTORS: Time since onset of injury/illness/exacerbation and anxiety, h/o c-section  are also affecting patient's functional outcome.   REHAB POTENTIAL: Good  CLINICAL DECISION MAKING: Evolving/moderate complexity  EVALUATION COMPLEXITY: Moderate   GOALS: Goals reviewed with patient? Yes  SHORT TERM GOALS: Target date: 4/19  Pt will be compliant with daily HEP performance Baseline: began establishing at eval Goal status: IN PROGRESS  2.  Avoiding sustained seated positions greater than 45 min when able Baseline: reports pain with 1.5 hr of sitting and encouraged her  to avoid greater than 45 min throughout the day Goal status: INITIAL   LONG TERM GOALS: Target date: POC date  LEFS to improve by MDC Baseline: 9 point MDC Goal status: INITIAL  2.  Able to walk through [employer] without feeling shaky Baseline: she feels like this is her most visited store as she orders groceries online Goal status: INITIAL  3.  Gross MMT to improve by 10% Baseline: see obj Goal status: INITIAL  4.  Pt will verbalize reduction in sensation of back "needing to pop" Baseline: constant at eval Goal status: INITIAL    PLAN:  PT FREQUENCY: 1x/week  PT DURATION: 12 weeks  PLANNED INTERVENTIONS: Therapeutic exercises, Therapeutic activity, Neuromuscular re-education, Balance training, Gait training, Patient/Family education, Self Care, Joint mobilization, Stair training, Aquatic Therapy, Dry Needling, Spinal mobilization, Cryotherapy, Moist heat, Taping, Ionotophoresis 4mg /ml  Dexamethasone, Manual therapy, and Re-evaluation.  PLAN FOR NEXT SESSION: Continue dry needling as needed, right SI joint mobilizations   Riki Altes, PTA  08/14/22 8:56 AM  [facility] [address] [place], [place], [address] Phone: [phone]   Fax:  [phone]     Check all possible CPT codes: [phone] - PT Re-evaluation, 97110- Therapeutic Exercise, [phone]- Neuro Re-education, [phone] - Gait Training, [phone] - Manual Therapy, [phone] - Therapeutic Activities, [phone] - Self Care, [phone] - Iontophoresis, and [id number] - Aquatic therapy    Check all conditions that are expected to impact treatment: Musculoskeletal disorders   If treatment provided at initial evaluation, no treatment charged due to lack of authorization.

## 2022-08-15 ENCOUNTER — Ambulatory Visit (AMBULATORY_SURGERY_CENTER): Payer: Medicaid Other | Admitting: Gastroenterology

## 2022-08-15 ENCOUNTER — Encounter: Payer: Self-pay | Admitting: Gastroenterology

## 2022-08-15 VITALS — BP 115/58 | HR 75 | Temp 98.7°F | Resp 15 | Ht 64.0 in | Wt 222.0 lb

## 2022-08-15 DIAGNOSIS — R1314 Dysphagia, pharyngoesophageal phase: Secondary | ICD-10-CM | POA: Diagnosis not present

## 2022-08-15 MED ORDER — SODIUM CHLORIDE 0.9 % IV SOLN: 500.0000 mL | Freq: Once | INTRAVENOUS | Status: DC

## 2022-08-15 NOTE — Patient Instructions (Addendum)
Resume previous diet and continue present medications Perform a barium swallow using barium in liquid and tablet form at appointment to be scheduled  Recommended to continue efforts to stop smoking  Recommended to take medications in yogurt or applesauce and plenty of liquids  If not previously done, discuss lung cancer screening CT scan with your PCP   YOU HAD AN ENDOSCOPIC PROCEDURE TODAY AT THE [facility]:   Refer to the procedure report that was given to you for any specific questions about what was found during the examination.  If the procedure report does not answer your questions, please call your gastroenterologist to clarify.  If you requested that your care partner not be given the details of your procedure findings, then the procedure report has been included in a sealed envelope for you to review at your convenience later.  YOU SHOULD EXPECT: Some feelings of bloating in the abdomen. Passage of more gas than usual.  Walking can help get rid of the air that was put into your GI tract during the procedure and reduce the bloating. If you had a lower endoscopy (such as a colonoscopy or flexible sigmoidoscopy) you may notice spotting of blood in your stool or on the toilet paper. If you underwent a bowel prep for your procedure, you may not have a normal bowel movement for a few days.  Please Note:  You might notice some irritation and congestion in your nose or some drainage.  This is from the oxygen used during your procedure.  There is no need for concern and it should clear up in a day or so.  SYMPTOMS TO REPORT IMMEDIATELY:   Following upper endoscopy (EGD)  Vomiting of blood or coffee ground material  New chest pain or pain under the shoulder blades  Painful or persistently difficult swallowing  New shortness of breath  Fever of 100F or higher  Black, tarry-looking stools  For urgent or emergent issues, a gastroenterologist can be reached at any hour by calling  [phone]. Do not use MyChart messaging for urgent concerns.    DIET:  We do recommend a small meal at first, but then you may proceed to your regular diet.  Drink plenty of fluids but you should avoid alcoholic beverages for 24 hours.  ACTIVITY:  You should plan to take it easy for the rest of today and you should NOT DRIVE or use heavy machinery until tomorrow (because of the sedation medicines used during the test).    FOLLOW UP: Our staff will call the number listed on your records the next business day following your procedure.  We will call around 7:15- 8:00 am to check on you and address any questions or concerns that you may have regarding the information given to you following your procedure. If we do not reach you, we will leave a message.     If any biopsies were taken you will be contacted by phone or by letter within the next 1-3 weeks.  Please call us at [phone] if you have not heard about the biopsies in 3 weeks.    SIGNATURES/CONFIDENTIALITY: You and/or your care partner have signed paperwork which will be entered into your electronic medical record.  These signatures attest to the fact that that the information above on your After Visit Summary has been reviewed and is understood.  Full responsibility of the confidentiality of this discharge information lies with you and/or your care-partner.

## 2022-08-15 NOTE — Progress Notes (Signed)
Uneventful anesthetic. Report to pacu rn. Vss. Care resumed by rn. 

## 2022-08-15 NOTE — Op Note (Signed)
[facility] Patient Name: Brycen Mazza Procedure Date: 08/15/2022 11:11 AM MRN: [id number] Endoscopist: Sherilyn Cooter L. Myrtie Neither , MD, [id number] Age: 42 Referring MD:  Date of Birth: [birth year] Gender: Female Account #: [id number] Procedure:                Upper GI endoscopy Indications:              Pharyngeal/esophageal phase dysphagia                           see 08/13/22 office consult note for details Medicines:                Monitored Anesthesia Care Procedure:                Pre-Anesthesia Assessment:                           - Prior to the procedure, a History and Physical                            was performed, and patient medications and                            allergies were reviewed. The patient's tolerance of                            previous anesthesia was also reviewed. The risks                            and benefits of the procedure and the sedation                            options and risks were discussed with the patient.                            All questions were answered, and informed consent                            was obtained. Prior Anticoagulants: The patient has                            taken no anticoagulant or antiplatelet agents. ASA                            Grade Assessment: II - A patient with mild systemic                            disease. After reviewing the risks and benefits,                            the patient was deemed in satisfactory condition to                            undergo the procedure.  After obtaining informed consent, the endoscope was                            passed under direct vision. Throughout the                            procedure, the patient's blood pressure, pulse, and                            oxygen saturations were monitored continuously. The                            GIF [id number] #[id number] was introduced through the                            mouth, and advanced to  the second part of duodenum.                            The upper GI endoscopy was accomplished without                            difficulty. The patient tolerated the procedure                            well. Scope In: Scope Out: Findings:                 The larynx was normal.                           The esophagus was normal.                           The stomach was normal.                           The cardia and gastric fundus were normal on                            retroflexion.                           The examined duodenum was normal. Complications:            No immediate complications. Estimated Blood Loss:     Estimated blood loss: none. Impression:               - Normal larynx.                           - Normal esophagus.                           - Normal stomach.                           - Normal examined duodenum.                           -  No specimens collected.                           Symptoms most consistent with cricopharyngeal                            dysmotilty. Recommendation:           - Patient has a contact number available for                            emergencies. The signs and symptoms of potential                            delayed complications were discussed with the                            patient. Return to normal activities tomorrow.                            Written discharge instructions were provided to the                            patient.                           - Resume previous diet.                           - Continue present medications.                           - Perform a barium swallow using barium in liquid                            and tablet form at appointment to be scheduled.                           - Continue efforts to stop smoking.                           - Take all medicines with yogurt or applesauce and                            plenty of liquids.                           - If not previously  done, discuss lung cancer                            screening CT scan with your PCP. Bora Bost L. Myrtie Neither, MD 08/15/2022 11:49:38 AM This report has been signed electronically.

## 2022-08-15 NOTE — Progress Notes (Signed)
No changes to clinical history since GI office visit on 08/13/22. See subsequent chart note from patient re: weight loss  Today she clarifies the 50 pounds of weight loss were in 2022 while still recovering from prior surgery.  Currently working with a [facility] weight loss clinic.  The patient is appropriate for an endoscopic procedure in the ambulatory setting.  - Amada Jupiter, MD

## 2022-08-16 ENCOUNTER — Other Ambulatory Visit: Payer: Self-pay

## 2022-08-16 ENCOUNTER — Telehealth: Payer: Self-pay

## 2022-08-16 DIAGNOSIS — R1314 Dysphagia, pharyngoesophageal phase: Secondary | ICD-10-CM

## 2022-08-16 NOTE — Telephone Encounter (Signed)
Post procedure follow up call, no answer 

## 2022-08-21 ENCOUNTER — Ambulatory Visit (HOSPITAL_BASED_OUTPATIENT_CLINIC_OR_DEPARTMENT_OTHER): Payer: Medicaid Other | Admitting: Physical Therapy

## 2022-08-21 ENCOUNTER — Encounter (HOSPITAL_BASED_OUTPATIENT_CLINIC_OR_DEPARTMENT_OTHER): Payer: Self-pay

## 2022-08-23 ENCOUNTER — Encounter (HOSPITAL_BASED_OUTPATIENT_CLINIC_OR_DEPARTMENT_OTHER): Payer: Self-pay

## 2022-08-23 ENCOUNTER — Ambulatory Visit (HOSPITAL_BASED_OUTPATIENT_CLINIC_OR_DEPARTMENT_OTHER): Payer: Medicaid Other

## 2022-08-23 DIAGNOSIS — R262 Difficulty in walking, not elsewhere classified: Secondary | ICD-10-CM

## 2022-08-23 DIAGNOSIS — M6281 Muscle weakness (generalized): Secondary | ICD-10-CM | POA: Diagnosis not present

## 2022-08-23 DIAGNOSIS — M5459 Other low back pain: Secondary | ICD-10-CM

## 2022-08-23 NOTE — Therapy (Signed)
OUTPATIENT PHYSICAL THERAPY EVALUATION   Patient Name: Ellen Booker MRN: [id number] DOB:04/04/1980, 42 y.o., female Today's Date: 08/23/2022  END OF SESSION:  PT End of Session - 08/23/22 0853     Visit Number 11    Number of Visits 13    Date for PT Re-Evaluation 09/13/22    Authorization Type [place] [employer]    PT Start Time 0854    PT Stop Time 0932    PT Time Calculation (min) 38 min    Activity Tolerance Patient tolerated treatment well    Behavior During Therapy [facility] for tasks assessed/performed                  Past Medical History:  Diagnosis Date   Abdominal wall mass    left side   Anemia    Anxiety    Depression    Elevated cholesterol    GERD (gastroesophageal reflux disease)    History of Helicobacter pylori infection    per pt 2016   Renal cyst, right    per CT 04-04-2016   Vaginal Pap smear, abnormal    Wears glasses    Past Surgical History:  Procedure Laterality Date   back  08/2021   CESAREAN SECTION  2010   COLONOSCOPY  last one 06-19-2015   EXCISION ABDOMINAL WALL MASS  2012   right side   EXCISION MASS ABDOMINAL N/A 05/21/2016   Procedure: EXCISION ABDOMINAL WALL MASS;  Surgeon: Rodman Pickle, MD;  Location: [facility] [place];  Service: General;  Laterality: N/A;   RESECTION OF ABDOMINAL MASS     Patient Active Problem List   Diagnosis Date Noted   Dysmenorrhea 02/01/2014   Tobacco dependency 02/01/2014   Obesity 12/09/2013   Essential hypertension, benign 12/09/2013   Candidiasis of vulva and vagina 12/08/2013   Bilateral nipple discharge 11/08/2013   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   Unspecified disorder of menstruation and other abnormal bleeding from female genital tract 11/08/2013   Irregular menstrual cycle 11/08/2013   UTI (lower urinary tract infection) 01/29/2013   HSV (herpes simplex virus) infection 01/26/2013   Well woman exam with routine gynecological exam 01/26/2013   BMI 40.0-44.9,  adult (HCC) 01/26/2013   Elevated BP 01/26/2013    REFERRING PROVIDER:  Jonetta Speak, NP   REFERRING DIAG:  Diagnosis  Z98.1 (ICD-10-CM) - Arthrodesis status  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 "It was more sore than the first time I had it." Pt reports overall improvement form cupping and DN. "I'm able to bend to the right a little more." 4/10 pain level today, has been usually 5/10.   PERTINENT HISTORY:  Anxiety, h/o c-section  PAIN:  Are you having pain? Yes: NPRS scale: 4/10 Pain location: middle of lower back Pain description: sharp pain to the right lower back where it feels like a pull with a long stride Aggravating factors: long stride Relieving factors: pain meds, laying on left side.   PRECAUTIONS: None  WEIGHT BEARING RESTRICTIONS: No  FALLS:  Has patient fallen in last 6 months? Yes. Number of falls 1  LIVING ENVIRONMENT: Lives with: lives with an adult companion Lives in: House/apartment Stairs: yes Has following equipment at home: Single point cane and Environmental consultant - 4 wheeled  OCCUPATION: not working  PLOF: Independent with basic ADLs  PATIENT GOALS: lift leg for more than a couple of seconds without shaking, regain strength, relieve sensation that back needs to pop   OBJECTIVE:   DIAGNOSTIC FINDINGS:  MRI 09/30/20: IMPRESSION: Status post L5-S1  decompression. A large right paracentral and subarticular recess disc protrusion impinges on the right S1 root and deforms the right side of the thecal sac. There is also moderate right foraminal narrowing at this level.  Mild central canal and bilateral foraminal narrowing L4-5.  Xray lumbar completed 04/22/22.  PATIENT SURVEYS:  EVAL: LEFS: 21/80  COGNITIVE STATUS: Within functional limits for tasks assessed   POSTURE:  Eval: bil genu valgus and pronated through feet  GAIT: No AD at eval with slow cadence, short step length Comments: discussed trekking poles vs [facility]   Body Part #1 Lumbar   Lumbar ROM LUMBAR ROM:   Active  A/PROM  eval  Flexion Knee joint line  Extension   Right lateral flexion Joint line  Left lateral flexion Joint line  Right rotation   Left rotation    (Blank rows = not tested)       Body Part #2 Hip//knee   LE MMT LOWER EXTREMITY MMT:    MMT Right eval Left eval  Hip flexion 19.7 18.8  Hip extension    Hip abduction 28.5 21.5  Hip adduction    Hip internal rotation    Hip external rotation    Knee flexion 38.7 24.9  Knee extension 31.3 28.1   (Blank rows = not tested)     FUNCTIONAL TESTS:  30s chair stand: 5.5 wihtout UEs SLS: bil 2-3 s with ability to control balance correction without UEs   TREATMENT:                                                                                                                               Treatment  5/31:  Manual HSS R Manual Piriformis stretch R DKTC with red physioball x20 Cupping- static technique- prone lying-4 cups to low back  STM R lumbar ps/glutes in sidelying LTR x15ea with red physioball Hooklying PPT 2x10 5" hold  5/22  Trigger Point Dry-Needling  Treatment instructions: Expect mild to moderate muscle soreness. S/S of pneumothorax if dry needled over a lung field, and to seek immediate medical attention should they occur. Patient verbalized  understanding of these instructions and education.  Patient Consent Given: Yes Education handout provided: Yes Muscles treated: bilateral gluteals 2 spots each using a .75x30 needle  Electrical stimulation performed: No Parameters: N/A Treatment response/outcome: great twtich   Manual: skilled palpation of trigger points. ; STM to gluteals and lower lumbar spine   Reviewed HEP and how to use it:   LTR x15   Gluteal stretch 2x20 sec hold   Supine march 2x10 each leg with cueing for TA breathing  Supine PPT x15 with cuing for TA breathing       Treatment                            5/20:  Manual HSS R Manual Piriformis stretch Cupping- static technique- prone lying-6 cups to low back- Gliding technique to lower left lumbar paraspinals STM R lumbar ps/glutes in sidelying LTR x15ea Hooklying PPT 2x10 5" hold Supine march 1x10 (some pain) Supine clam with GTB 2x10   Treatment                            5/15:  Manual HSS R STM R lumbar ps/glutes in sidelying LTR x15ea Hooklying PPT 2x10  Supine march 2x10 Supine clam with GTB 2x10 Seated QL stretch (sitting on corner of plinth Review of diaphragmatic breathing with exercises   Treatment                            5/8:  Trigger Point Dry Needling, Manual Therapy Treatment:  Initial or subsequent education regarding Trigger Point Dry Needling: Initial Did patient give consent to treatment with Trigger Point Dry Needling: Yes TPDN with skilled palpation and monitoring followed by STM to the following muscles: Rt parapsinals T12-L5, Rt glut max/med/min moving lateral from SIJ  SIJ mobs PA and inf on Rt upper quadrant Seated hp hinge Qped rocking   PATIENT EDUCATION:  Education details:  re-acquainting with aquatic therapy  Person educated: Patient Education method: Explanation, Demonstration, Tactile cues, Verbal cues, Education comprehension: verbalized understanding, returned demonstration, verbal cues required,  tactile cues required, and needs further education  HOME EXERCISE PROGRAM: [id number]   ASSESSMENT:  CLINICAL IMPRESSION: Patient remains guarded with passive stretching and requires frequent cueing to relax LEs.  No complaints of pain with gentle core lumbar stabilization interventions.  Continued with cupping and STM today due to positive response last session.  Educated patient on strategies to decrease muscle guarding throughout the day and to incorporate diaphragmatic breathing during times of muscle guarding.  Patient is interested in trying dry needling again will arrange this on the schedule.  Will continue to monitor pain level and improvements in daily function.   OBJECTIVE IMPAIRMENTS: Abnormal gait, decreased activity tolerance, decreased balance, difficulty walking, decreased strength, impaired perceived functional ability, increased muscle spasms, impaired flexibility, improper body mechanics, postural dysfunction, and pain.   ACTIVITY LIMITATIONS: carrying, lifting, bending, sitting, standing, squatting,  stairs, transfers, bathing, dressing, locomotion level, and caring for others  PARTICIPATION LIMITATIONS: meal prep, cleaning, laundry, shopping, community activity, occupation, and yard work  PERSONAL FACTORS: Time since onset of injury/illness/exacerbation and anxiety, h/o c-section  are also affecting patient's functional outcome.   REHAB POTENTIAL: Good  CLINICAL DECISION MAKING: Evolving/moderate complexity  EVALUATION COMPLEXITY: Moderate   GOALS: Goals reviewed with patient? Yes  SHORT TERM GOALS: Target date: 4/19  Pt will be compliant with daily HEP performance Baseline: began establishing at eval Goal status: IN PROGRESS  2.  Avoiding sustained seated positions greater than 45 min when able Baseline: reports pain with 1.5 hr of sitting and encouraged her to avoid greater than 45 min throughout the day Goal status: INITIAL   LONG TERM GOALS: Target date:  POC date  LEFS to improve by MDC Baseline: 9 point MDC Goal status: INITIAL  2.  Able to walk through [facility] without feeling shaky Baseline: she feels like this is her most visited store as she orders groceries online Goal status: INITIAL  3.  Gross MMT to improve by 10% Baseline: see obj Goal status: INITIAL  4.  Pt will verbalize reduction in sensation of back "needing to pop" Baseline: constant at eval Goal status: INITIAL    PLAN:  PT FREQUENCY: 1x/week  PT DURATION: 12 weeks  PLANNED INTERVENTIONS: Therapeutic exercises, Therapeutic activity, Neuromuscular re-education, Balance training, Gait training, Patient/Family education, Self Care, Joint mobilization, Stair training, Aquatic Therapy, Dry Needling, Spinal mobilization, Cryotherapy, Moist heat, Taping, Ionotophoresis 4mg /ml Dexamethasone, Manual therapy, and Re-evaluation.  PLAN FOR NEXT SESSION: Continue dry needling as needed, right SI joint mobilizations   Riki Altes, PTA  08/23/22 10:28 AM  [facility] [address] [place], [place], [address] Phone: [phone]   Fax:  [phone]     Check all possible CPT codes: [phone] - PT Re-evaluation, 97110- Therapeutic Exercise, [phone]- Neuro Re-education, [phone] - Gait Training, [phone] - Manual Therapy, [phone] - Therapeutic Activities, [phone] - Self Care, [phone] - Iontophoresis, and [id number] - Aquatic therapy    Check all conditions that are expected to impact treatment: Musculoskeletal disorders   If treatment provided at initial evaluation, no treatment charged due to lack of authorization.

## 2022-08-26 ENCOUNTER — Ambulatory Visit (HOSPITAL_BASED_OUTPATIENT_CLINIC_OR_DEPARTMENT_OTHER): Payer: Medicaid Other | Attending: Nurse Practitioner

## 2022-08-26 ENCOUNTER — Encounter (HOSPITAL_BASED_OUTPATIENT_CLINIC_OR_DEPARTMENT_OTHER): Payer: Self-pay

## 2022-08-26 DIAGNOSIS — M6281 Muscle weakness (generalized): Secondary | ICD-10-CM | POA: Diagnosis present

## 2022-08-26 DIAGNOSIS — M5442 Lumbago with sciatica, left side: Secondary | ICD-10-CM | POA: Diagnosis present

## 2022-08-26 DIAGNOSIS — M5459 Other low back pain: Secondary | ICD-10-CM | POA: Insufficient documentation

## 2022-08-26 DIAGNOSIS — M5441 Lumbago with sciatica, right side: Secondary | ICD-10-CM | POA: Diagnosis present

## 2022-08-26 DIAGNOSIS — G8929 Other chronic pain: Secondary | ICD-10-CM | POA: Insufficient documentation

## 2022-08-26 DIAGNOSIS — R262 Difficulty in walking, not elsewhere classified: Secondary | ICD-10-CM | POA: Diagnosis present

## 2022-08-26 NOTE — Therapy (Signed)
OUTPATIENT PHYSICAL THERAPY EVALUATION   Patient Name: Ellen Booker MRN: [id number] DOB:[birth year], 42 y.o., female Today's Date: 08/26/2022  END OF SESSION:  PT End of Session - 08/26/22 0848     Visit Number 12    Number of Visits 13    Date for PT Re-Evaluation 09/13/22    Authorization Type [place] [employer]    PT Start Time 0847    PT Stop Time 0928    PT Time Calculation (min) 41 min    Activity Tolerance Patient tolerated treatment well    Behavior During Therapy [facility] for tasks assessed/performed                  Past Medical History:  Diagnosis Date   Abdominal wall mass    left side   Anemia    Anxiety    Depression    Elevated cholesterol    GERD (gastroesophageal reflux disease)    History of Helicobacter pylori infection    per pt 2016   Renal cyst, right    per CT 04-04-2016   Vaginal Pap smear, abnormal    Wears glasses    Past Surgical History:  Procedure Laterality Date   back  08/2021   CESAREAN SECTION  2010   COLONOSCOPY  last one 06-19-2015   EXCISION ABDOMINAL WALL MASS  2012   right side   EXCISION MASS ABDOMINAL N/A 05/21/2016   Procedure: EXCISION ABDOMINAL WALL MASS;  Surgeon: Rodman Pickle, MD;  Location: [facility] [place];  Service: General;  Laterality: N/A;   RESECTION OF ABDOMINAL MASS     Patient Active Problem List   Diagnosis Date Noted   Dysmenorrhea 02/01/2014   Tobacco dependency 02/01/2014   Obesity 12/09/2013   Essential hypertension, benign 12/09/2013   Candidiasis of vulva and vagina 12/08/2013   Bilateral nipple discharge 11/08/2013   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   Unspecified disorder of menstruation and other abnormal bleeding from female genital tract 11/08/2013   Irregular menstrual cycle 11/08/2013   UTI (lower urinary tract infection) 01/29/2013   HSV (herpes simplex virus) infection 01/26/2013   Well woman exam with routine gynecological exam 01/26/2013   BMI 40.0-44.9, adult  (HCC) 01/26/2013   Elevated BP 01/26/2013    REFERRING PROVIDER:  Jonetta Speak, NP   REFERRING DIAG:  Diagnosis  Z98.1 (ICD-10-CM) - Arthrodesis status  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 "I fell on the escalator because my legs were shaking so much." Pt expresses that she does not want to use a walker, only a cane. She reports compliance with HEP and tries to climb her stairs at home frequently for exercise. "I am able to get out of bed easier than before. I can also reach down to my Right side easier than before."  PERTINENT HISTORY:  Anxiety, h/o c-section  PAIN:  Are you having pain? Yes: NPRS scale: 4/10 Pain location: middle of lower back Pain description: sharp pain to the right lower back where it feels like a pull with a long stride Aggravating factors: long stride Relieving factors: pain meds, laying on left side.   PRECAUTIONS: None  WEIGHT BEARING RESTRICTIONS: No  FALLS:  Has patient fallen in last 6 months? Yes. Number of falls 1  LIVING ENVIRONMENT: Lives with: lives with an adult companion Lives in: House/apartment Stairs: yes Has following equipment at home: Single point cane and Environmental consultant - 4 wheeled  OCCUPATION: not working  PLOF: Independent with basic ADLs  PATIENT GOALS: lift leg for more than a couple of seconds without shaking, regain strength,  relieve sensation that back needs to pop   OBJECTIVE:   DIAGNOSTIC FINDINGS:  MRI 09/30/20: IMPRESSION: Status post L5-S1 decompression. A large right paracentral and subarticular recess disc protrusion impinges on the right S1 root and deforms the right side of the thecal sac. There is also moderate right foraminal narrowing at this level.  Mild central canal and bilateral foraminal narrowing L4-5.  Xray lumbar completed 04/22/22.  PATIENT SURVEYS:  EVAL: LEFS: 21/80  COGNITIVE STATUS: Within functional limits for tasks assessed   POSTURE:  Eval: bil genu valgus and pronated through feet  GAIT: No AD at eval with slow cadence, short step length Comments: discussed trekking poles vs [facility]   Body Part #1 Lumbar   Lumbar ROM LUMBAR ROM:   Active  A/PROM  eval  Flexion Knee joint line  Extension   Right lateral flexion Joint line  Left lateral flexion Joint line  Right rotation   Left rotation    (Blank rows = not tested)       Body Part #2 Hip//knee   LE MMT LOWER EXTREMITY MMT:    MMT Right eval Left eval  Hip flexion 19.7 18.8  Hip extension    Hip abduction 28.5 21.5  Hip adduction    Hip internal rotation    Hip external rotation    Knee flexion 38.7 24.9  Knee extension 31.3 28.1   (Blank rows = not tested)     FUNCTIONAL TESTS:  30s chair stand: 5.5 wihtout UEs SLS: bil 2-3 s with ability to control balance correction without UEs   TREATMENT:  Treatment                            6/3:  Manual HSS R Manual Piriformis stretch R DKTC with red physioball x20 Cupping- static technique- prone lying-6 cups to low back   LTR x15ea with red physioball Hooklying PPT 2x10 5" hold  STS for review- patient completed 3 times, but clearly difficult so switched to mini squats with improved tolerance Mini squats x10 Standing  marching x10  Treatment                            5/31:  Manual HSS R Manual Piriformis stretch R DKTC with red physioball x20 Cupping- static technique- prone lying-4 cups to low back  STM R lumbar ps/glutes in sidelying LTR x15ea with red physioball Hooklying PPT 2x10 5" hold    PATIENT EDUCATION:  Education details:  re-acquainting with aquatic therapy  Person educated: Patient Education method: Explanation, Demonstration, Tactile cues, Verbal cues, Education comprehension: verbalized understanding, returned demonstration, verbal cues required, tactile cues required, and needs further education  HOME EXERCISE PROGRAM: [id number]   ASSESSMENT:  CLINICAL IMPRESSION: Pt continues to muscle guard with passive stretching, but improving in her awareness of this. Remains tight and tender to palpation of lumbosacral mm. And gluteal mm. She continues to response positively to cupping, so continued with this. Pt expressed she was unsure if she was performing her sit to stands correctly at home, so observed her performing these in clinic. She demonstrates excessive lumbar extension with this and significant LE weakness causing this compensation, even with elevated surface. Advised pt to stop this exercise at home. She had improved performance with partial squats, so added these to HEP instead. Educated pt on safely increasing her walking endurance as well as LE strengthening throughout the day. Will continue to progress as tolerated.     OBJECTIVE IMPAIRMENTS: Abnormal gait, decreased activity tolerance, decreased balance, difficulty walking, decreased strength, impaired perceived functional ability, increased muscle spasms, impaired flexibility, improper body mechanics, postural dysfunction, and pain.   ACTIVITY LIMITATIONS: carrying, lifting, bending, sitting, standing, squatting, stairs, transfers, bathing, dressing, locomotion level, and caring for others  PARTICIPATION LIMITATIONS: meal  prep, cleaning, laundry, shopping, community activity, occupation, and yard work  PERSONAL FACTORS: Time since onset of injury/illness/exacerbation and anxiety, h/o c-section  are also affecting patient's functional outcome.   REHAB POTENTIAL: Good  CLINICAL DECISION MAKING: Evolving/moderate complexity  EVALUATION COMPLEXITY: Moderate   GOALS: Goals reviewed with patient? Yes  SHORT TERM GOALS: Target date: 4/19  Pt will be compliant with daily HEP performance Baseline: began establishing at eval Goal status: IN PROGRESS  2.  Avoiding sustained seated positions greater than 45 min when able Baseline: reports pain with 1.5 hr of sitting and encouraged her to avoid greater than 45 min throughout the day Goal status: INITIAL   LONG TERM GOALS: Target date: POC date  LEFS to improve by MDC Baseline: 9 point MDC Goal status: INITIAL  2.  Able to walk through [employer] without feeling shaky Baseline: she feels like this is her most visited store as she orders groceries online Goal status: INITIAL  3.  Gross MMT to improve by 10% Baseline: see obj Goal status: INITIAL  4.  Pt will verbalize reduction in sensation of back "needing to pop" Baseline: constant at eval Goal status: INITIAL    PLAN:  PT FREQUENCY: 1x/week  PT  DURATION: 12 weeks  PLANNED INTERVENTIONS: Therapeutic exercises, Therapeutic activity, Neuromuscular re-education, Balance training, Gait training, Patient/Family education, Self Care, Joint mobilization, Stair training, Aquatic Therapy, Dry Needling, Spinal mobilization, Cryotherapy, Moist heat, Taping, Ionotophoresis 4mg /ml Dexamethasone, Manual therapy, and Re-evaluation.  PLAN FOR NEXT SESSION: Continue dry needling as needed, right SI joint mobilizations   Riki Altes, PTA  08/26/22 10:11 AM  [facility] [address] [place], [place], [address] Phone: [phone]   Fax:   [phone]     Check all possible CPT codes: [phone] - PT Re-evaluation, 97110- Therapeutic Exercise, [phone]- Neuro Re-education, [phone] - Gait Training, [phone] - Manual Therapy, [phone] - Therapeutic Activities, [phone] - Self Care, [phone] - Iontophoresis, and [id number] - Aquatic therapy    Check all conditions that are expected to impact treatment: Musculoskeletal disorders   If treatment provided at initial evaluation, no treatment charged due to lack of authorization.

## 2022-08-28 ENCOUNTER — Ambulatory Visit (HOSPITAL_BASED_OUTPATIENT_CLINIC_OR_DEPARTMENT_OTHER): Payer: Medicaid Other

## 2022-08-28 ENCOUNTER — Encounter (HOSPITAL_BASED_OUTPATIENT_CLINIC_OR_DEPARTMENT_OTHER): Payer: Self-pay

## 2022-08-30 ENCOUNTER — Ambulatory Visit
Admission: RE | Admit: 2022-08-30 | Discharge: 2022-08-30 | Disposition: A | Discharge status: Home / Self Care | Payer: Medicaid Other | Source: Ambulatory Visit | Attending: Orthopedic Surgery | Admitting: Orthopedic Surgery

## 2022-08-30 DIAGNOSIS — M960 Pseudarthrosis after fusion or arthrodesis: Secondary | ICD-10-CM

## 2022-09-02 ENCOUNTER — Encounter (HOSPITAL_BASED_OUTPATIENT_CLINIC_OR_DEPARTMENT_OTHER): Payer: Self-pay

## 2022-09-02 ENCOUNTER — Ambulatory Visit (HOSPITAL_BASED_OUTPATIENT_CLINIC_OR_DEPARTMENT_OTHER): Payer: Medicaid Other

## 2022-09-02 DIAGNOSIS — R262 Difficulty in walking, not elsewhere classified: Secondary | ICD-10-CM

## 2022-09-02 DIAGNOSIS — M5459 Other low back pain: Secondary | ICD-10-CM

## 2022-09-02 DIAGNOSIS — M6281 Muscle weakness (generalized): Secondary | ICD-10-CM | POA: Diagnosis not present

## 2022-09-02 NOTE — Therapy (Signed)
OUTPATIENT PHYSICAL THERAPY TREATMENT   Patient Name: Ellen Booker MRN: [id number] DOB:1980/12/13, 42 y.o., female Today's Date: 09/02/2022  END OF SESSION:  PT End of Session - 09/02/22 0929     Visit Number 13    Number of Visits 13    Date for PT Re-Evaluation 09/13/22    Authorization Type [place] [employer]    Authorization - Number of Visits 27    PT Start Time 0932    PT Stop Time 1016    PT Time Calculation (min) 44 min    Activity Tolerance Patient tolerated treatment well    Behavior During Therapy WFL for tasks assessed/performed                  Past Medical History:  Diagnosis Date   Abdominal wall mass    left side   Anemia    Anxiety    Depression    Elevated cholesterol    GERD (gastroesophageal reflux disease)    History of Helicobacter pylori infection    per pt 2016   Renal cyst, right    per CT 04-04-2016   Vaginal Pap smear, abnormal    Wears glasses    Past Surgical History:  Procedure Laterality Date   back  08/2021   CESAREAN SECTION  2010   COLONOSCOPY  last one 06-19-2015   EXCISION ABDOMINAL WALL MASS  2012   right side   EXCISION MASS ABDOMINAL N/A 05/21/2016   Procedure: EXCISION ABDOMINAL WALL MASS;  Surgeon: Rodman Pickle, MD;  Location: [facility] [place];  Service: General;  Laterality: N/A;   RESECTION OF ABDOMINAL MASS     Patient Active Problem List   Diagnosis Date Noted   Dysmenorrhea 02/01/2014   Tobacco dependency 02/01/2014   Obesity 12/09/2013   Essential hypertension, benign 12/09/2013   Candidiasis of vulva and vagina 12/08/2013   Bilateral nipple discharge 11/08/2013   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   Unspecified disorder of menstruation and other abnormal bleeding from female genital tract 11/08/2013   Irregular menstrual cycle 11/08/2013   UTI (lower urinary tract infection) 01/29/2013   HSV (herpes simplex virus) infection 01/26/2013   Well woman exam with routine gynecological  exam 01/26/2013   BMI 40.0-44.9, adult (HCC) 01/26/2013   Elevated BP 01/26/2013    REFERRING PROVIDER:  Jonetta Speak, NP   REFERRING DIAG:  Diagnosis  Z98.1 (ICD-10-CM) - Arthrodesis status  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 Can be 6/10 when starting to lay flat on back. She reports she had a busy weekend and this increased her pain.   PERTINENT HISTORY:  Anxiety, h/o c-section  PAIN:  Are you having pain? Yes: NPRS scale: 4/10 Pain location: middle of lower back Pain description: sharp pain to the right lower back where it feels like a pull with a long stride Aggravating factors: long stride Relieving factors: pain meds, laying on left side.   PRECAUTIONS: None  WEIGHT BEARING RESTRICTIONS: No  FALLS:  Has patient fallen in last 6 months? Yes. Number of falls 1  LIVING ENVIRONMENT: Lives with: lives with an adult companion Lives in: House/apartment Stairs: yes Has following equipment at home: Single point cane and Environmental consultant - 4 wheeled  OCCUPATION: not working  PLOF: Independent with basic ADLs  PATIENT GOALS: lift leg for more than a couple of seconds without shaking, regain strength, relieve sensation that back needs to pop   OBJECTIVE:   DIAGNOSTIC FINDINGS:  MRI 09/30/20: IMPRESSION: Status post L5-S1 decompression. A large right paracentral and subarticular recess disc protrusion impinges on the  right S1 root and deforms the right side of the thecal sac. There is also moderate right foraminal narrowing at this level.  Mild central canal and bilateral foraminal narrowing L4-5.  Xray lumbar completed 04/22/22.  PATIENT SURVEYS:  EVAL: LEFS: 21/80  COGNITIVE STATUS: Within functional limits for tasks assessed   POSTURE:  Eval: bil genu valgus and pronated through feet  GAIT: No AD at eval with slow cadence, short step length Comments: discussed trekking poles vs [facility]   Body Part #1 Lumbar   Lumbar ROM LUMBAR ROM:   Active  A/PROM  eval  Flexion Knee joint line  Extension   Right lateral flexion Joint line  Left lateral flexion Joint line  Right rotation   Left rotation    (Blank rows = not tested)       Body Part #2 Hip//knee   LE MMT LOWER EXTREMITY MMT:    MMT Right eval Left eval  Hip flexion 19.7 18.8  Hip extension    Hip abduction 28.5 21.5  Hip adduction    Hip internal rotation    Hip external rotation    Knee flexion 38.7 24.9  Knee extension 31.3 28.1   (Blank rows = not tested)     FUNCTIONAL TESTS:  30s chair stand: 5.5 wihtout UEs SLS: bil 2-3 s with ability to control balance correction without UEs   TREATMENT:                                                                                                                                Treatment                            6/10:  Manual HSS  R Manual Piriformis stretch R DKTC with red physioball x20 Cupping- static and gliding techniques- prone lying-6 cups to low back  2 cups on lumbar region- cat/cow x5ea  LTR x15ea with red physioball Hooklying PPT 2x10 5" hold  Mini squats 2x10 Side stepping at rail x 1 lap (some discomfort going to the R).    Treatment                            6/3:  Manual HSS R Manual Piriformis stretch R DKTC with red physioball x20 Cupping- static technique- prone lying-6 cups to low back   LTR x15ea with red physioball Hooklying PPT  2x10 5" hold  STS for review- patient completed 3 times, but clearly difficult so switched to mini squats with improved tolerance Mini squats x10 Standing marching x10  PATIENT EDUCATION:  Education details:  re-acquainting with aquatic therapy  Person educated: Patient Education method: Explanation, Demonstration, Tactile cues, Verbal cues, Education comprehension: verbalized understanding, returned demonstration, verbal cues required, tactile cues required, and needs further education  HOME EXERCISE PROGRAM: [id number]   ASSESSMENT:  CLINICAL IMPRESSION: Pt demonstrates continued tightness and discomfort with passive stretching. She has decreased guarding than initial sessions however. Pt did have increased lumbar pain today with pelvic tilts, which she attributed to being at graduation this weekend. Continued with manual intervention due to reported benefit. Pt reports overall decrease in tightness and guarding since starting PT. Able to ambulate with better quality using SPC, though limited in distance. Cues required with partial squats for  core activation and proper form. Trialed side stepping with pt reporting discomfort in low back when stepping to the right. Significant weakness observed in bilateral Les during squats. Pt will benefit from continued PT to improve strength and decrease pain.    OBJECTIVE IMPAIRMENTS: Abnormal gait, decreased activity tolerance, decreased balance, difficulty walking, decreased strength, impaired perceived functional ability, increased muscle spasms, impaired flexibility, improper body mechanics, postural dysfunction, and pain.   ACTIVITY LIMITATIONS: carrying, lifting, bending, sitting, standing, squatting, stairs, transfers, bathing, dressing, locomotion level, and caring for others  PARTICIPATION LIMITATIONS: meal prep, cleaning, laundry, shopping, community activity, occupation, and yard work  PERSONAL FACTORS: Time since onset of  injury/illness/exacerbation and anxiety, h/o c-section  are also affecting patient's functional outcome.   REHAB POTENTIAL: Good  CLINICAL DECISION MAKING: Evolving/moderate complexity  EVALUATION COMPLEXITY: Moderate   GOALS: Goals reviewed with patient? Yes  SHORT TERM GOALS: Target date: 4/19  Pt will be compliant with daily HEP performance Baseline: began establishing at eval Goal status: IN PROGRESS  2.  Avoiding sustained seated positions greater than 45 min when able Baseline: reports pain with 1.5 hr of sitting and encouraged her to avoid greater than 45 min throughout the day Goal status: INITIAL   LONG TERM GOALS: Target date: POC date  LEFS to improve by MDC Baseline: 9 point MDC Goal status: INITIAL  2.  Able to walk through [facility] without feeling shaky Baseline: she feels like this is her most visited store as she orders groceries online Goal status: INITIAL  3.  Gross MMT to improve by 10% Baseline: see obj Goal status: INITIAL  4.  Pt will verbalize reduction in sensation of back "needing to pop" Baseline: constant at eval Goal status: INITIAL    PLAN:  PT FREQUENCY: 1x/week  PT DURATION: 12 weeks  PLANNED INTERVENTIONS: Therapeutic exercises, Therapeutic activity, Neuromuscular re-education, Balance training, Gait training, Patient/Family education, Self Care,  Joint mobilization, Stair training, Aquatic Therapy, Dry Needling, Spinal mobilization, Cryotherapy, Moist heat, Taping, Ionotophoresis 4mg /ml Dexamethasone, Manual therapy, and Re-evaluation.  PLAN FOR NEXT SESSION: Continue dry needling as needed, right SI joint mobilizations   Riki Altes, PTA  09/02/22 10:38 AM  [facility] [address] [place], [place], [address] Phone: [phone]   Fax:  [phone]     Check all possible CPT codes: [phone] - PT Re-evaluation, 97110- Therapeutic Exercise, [phone]- Neuro Re-education,  [phone] - Gait Training, [phone] - Manual Therapy, [phone] - Therapeutic Activities, [phone] - Self Care, [phone] - Iontophoresis, and [id number] - Aquatic therapy    Check all conditions that are expected to impact treatment: Musculoskeletal disorders   If treatment provided at initial evaluation, no treatment charged due to lack of authorization.

## 2022-09-04 ENCOUNTER — Encounter (HOSPITAL_BASED_OUTPATIENT_CLINIC_OR_DEPARTMENT_OTHER): Payer: Medicaid Other

## 2022-09-05 ENCOUNTER — Encounter: Payer: Self-pay | Admitting: Orthopedic Surgery

## 2022-09-05 ENCOUNTER — Encounter (HOSPITAL_BASED_OUTPATIENT_CLINIC_OR_DEPARTMENT_OTHER): Payer: Self-pay

## 2022-09-05 ENCOUNTER — Ambulatory Visit (HOSPITAL_BASED_OUTPATIENT_CLINIC_OR_DEPARTMENT_OTHER): Payer: Medicaid Other

## 2022-09-05 DIAGNOSIS — G8929 Other chronic pain: Secondary | ICD-10-CM

## 2022-09-05 DIAGNOSIS — M5459 Other low back pain: Secondary | ICD-10-CM

## 2022-09-05 DIAGNOSIS — R262 Difficulty in walking, not elsewhere classified: Secondary | ICD-10-CM

## 2022-09-05 DIAGNOSIS — M6281 Muscle weakness (generalized): Secondary | ICD-10-CM

## 2022-09-05 NOTE — Therapy (Signed)
OUTPATIENT PHYSICAL THERAPY TREATMENT   Patient Name: Ellen Booker MRN: [id number] DOB:1980/11/12, 42 y.o., female Today's Date: 09/05/2022  END OF SESSION:  PT End of Session - 09/05/22 0938     Visit Number 14    Number of Visits 13    Date for PT Re-Evaluation 09/13/22    Authorization Type [place] [employer]    Authorization - Number of Visits 27    PT Start Time 0936    PT Stop Time 1016    PT Time Calculation (min) 40 min    Activity Tolerance Patient tolerated treatment well    Behavior During Therapy [facility] for tasks assessed/performed                  Past Medical History:  Diagnosis Date   Abdominal wall mass    left side   Anemia    Anxiety    Depression    Elevated cholesterol    GERD (gastroesophageal reflux disease)    History of Helicobacter pylori infection    per pt 2016   Renal cyst, right    per CT 04-04-2016   Vaginal Pap smear, abnormal    Wears glasses    Past Surgical History:  Procedure Laterality Date   back  08/2021   CESAREAN SECTION  2010   COLONOSCOPY  last one 06-19-2015   EXCISION ABDOMINAL WALL MASS  2012   right side   EXCISION MASS ABDOMINAL N/A 05/21/2016   Procedure: EXCISION ABDOMINAL WALL MASS;  Surgeon: Rodman Pickle, MD;  Location: [facility] [place];  Service: General;  Laterality: N/A;   RESECTION OF ABDOMINAL MASS     Patient Active Problem List   Diagnosis Date Noted   Dysmenorrhea 02/01/2014   Tobacco dependency 02/01/2014   Obesity 12/09/2013   Essential hypertension, benign 12/09/2013   Candidiasis of vulva and vagina 12/08/2013   Bilateral nipple discharge 11/08/2013   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   Unspecified disorder of menstruation and other abnormal bleeding from female genital tract 11/08/2013   Irregular menstrual cycle 11/08/2013   UTI (lower urinary tract infection) 01/29/2013   HSV (herpes simplex virus) infection 01/26/2013   Well woman exam with routine gynecological  exam 01/26/2013   BMI 40.0-44.9, adult (HCC) 01/26/2013   Elevated BP 01/26/2013    REFERRING PROVIDER:  Jonetta Speak, NP   REFERRING DIAG:  Diagnosis  Z98.1 (ICD-10-CM) - Arthrodesis status  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 She reports she has been trying to walk in the morning and do her exercises outside. Reports her legs shake with prolonged standing/walking. "It really bothers me, the shaking."  PERTINENT HISTORY:  Anxiety, h/o c-section  PAIN:  Are you having pain? Yes: NPRS scale: 4/10 Pain location: middle of lower back Pain description: sharp pain to the right lower back where it feels like a pull with a long stride Aggravating factors: long stride Relieving factors: pain meds, laying on left side.   PRECAUTIONS: None  WEIGHT BEARING RESTRICTIONS: No  FALLS:  Has patient fallen in last 6 months? Yes. Number of falls 1  LIVING ENVIRONMENT: Lives with: lives with an adult companion Lives in: House/apartment Stairs: yes Has following equipment at home: Single point cane and Environmental consultant - 4 wheeled  OCCUPATION: not working  PLOF: Independent with basic ADLs  PATIENT GOALS: lift leg for more than a couple of seconds without shaking, regain strength, relieve sensation that back needs to pop   OBJECTIVE:   DIAGNOSTIC FINDINGS:  MRI 09/30/20: IMPRESSION: Status post L5-S1 decompression. A large right  paracentral and subarticular recess disc protrusion impinges on the right S1 root and deforms the right side of the thecal sac. There is also moderate right foraminal narrowing at this level.  Mild central canal and bilateral foraminal narrowing L4-5.  Xray lumbar completed 04/22/22.  PATIENT SURVEYS:  EVAL: LEFS: 21/80  COGNITIVE STATUS: Within functional limits for tasks assessed   POSTURE:  Eval: bil genu valgus and pronated through feet  GAIT: No AD at eval with slow cadence, short step length Comments: discussed trekking poles vs [facility]   Body Part #1 Lumbar   Lumbar ROM LUMBAR ROM:   Active  A/PROM  eval  Flexion Knee joint line  Extension   Right lateral flexion Joint line  Left lateral flexion Joint line  Right rotation   Left rotation    (Blank rows = not tested)       Body Part #2 Hip//knee   LE MMT LOWER EXTREMITY MMT:    MMT Right eval Left eval  Hip flexion 19.7 18.8  Hip extension    Hip abduction 28.5 21.5  Hip adduction    Hip internal rotation    Hip external rotation    Knee flexion 38.7 24.9  Knee extension 31.3 28.1   (Blank rows = not tested)     FUNCTIONAL TESTS:  30s chair stand: 5.5 wihtout UEs SLS: bil 2-3 s with ability to control balance correction without UEs   TREATMENT:                                                                                                                               Treatment  6/13: Manual HSS R Manual Piriformis stretch R LTR x15ea  Hooklying PPT 2x10 5" hold Sidelying clams- 3" hold 2x10 ea  Partial lunges x10ea  Heel raise (R) Discussion of HEP and PT plan.  Treatment                            6/10:  Manual HSS R Manual Piriformis stretch R DKTC with red physioball x20 Cupping- static and gliding techniques- prone lying-6 cups to low back  2 cups on lumbar region- cat/cow x5ea  LTR x15ea with red physioball Hooklying PPT 2x10 5" hold  Mini squats  2x10 Side stepping at rail x 1 lap (some discomfort going to the R).    Treatment                            6/3:  Manual HSS R Manual Piriformis stretch R DKTC with red physioball x20 Cupping- static technique- prone lying-6 cups to low back   LTR x15ea with red physioball Hooklying PPT 2x10 5" hold  STS for review- patient completed 3 times, but clearly difficult so switched to mini squats with improved tolerance Mini squats x10 Standing marching x10  PATIENT EDUCATION:  Education details:  re-acquainting with aquatic therapy  Person educated: Patient Education method: Explanation, Demonstration, Tactile cues, Verbal cues, Education comprehension: verbalized understanding, returned demonstration, verbal cues required, tactile cues required, and needs further education  HOME EXERCISE PROGRAM: [id number]   ASSESSMENT:  CLINICAL IMPRESSION: Held manual today to see how pt responds. She had good tolerance for standing exercises. Pt has significant difficulty with plantar flexion of R LE in weight bearing. Instructed her to work on seated and standing heel raises even if position has to be modified. She states she has had trouble with this since her surgery. Requires cues with partial lunges for quad and glue activation. Some LE shakiness present, but advised pt this is a normal response of strengthening and to not avoid exercises because of this unless her knees feel a buckling sensation. Pt has reeval next week.   OBJECTIVE IMPAIRMENTS: Abnormal gait, decreased activity tolerance, decreased balance, difficulty walking, decreased strength, impaired perceived functional ability, increased muscle spasms, impaired flexibility, improper body mechanics, postural dysfunction, and pain.   ACTIVITY LIMITATIONS: carrying, lifting, bending, sitting, standing, squatting, stairs, transfers, bathing, dressing, locomotion level, and caring for others  PARTICIPATION LIMITATIONS: meal prep,  cleaning, laundry, shopping, community activity, occupation, and yard work  PERSONAL FACTORS: Time since onset of injury/illness/exacerbation and anxiety, h/o c-section  are also affecting patient's functional outcome.   REHAB POTENTIAL: Good  CLINICAL DECISION MAKING: Evolving/moderate complexity  EVALUATION COMPLEXITY: Moderate   GOALS: Goals reviewed with patient? Yes  SHORT TERM GOALS: Target date: 4/19  Pt will be compliant with daily HEP performance Baseline: began establishing at eval Goal status: IN PROGRESS  2.  Avoiding sustained seated positions greater than 45 min when able Baseline: reports pain with 1.5 hr of sitting and encouraged her to avoid greater than 45 min throughout the day Goal status: INITIAL   LONG TERM GOALS: Target date: POC date  LEFS to improve by MDC Baseline: 9 point MDC Goal status: INITIAL  2.  Able to walk through [facility] without feeling shaky Baseline: she feels like this is her most visited store as she orders groceries online Goal status: INITIAL  3.  Gross MMT to improve by 10% Baseline: see obj  Goal status: INITIAL  4.  Pt will verbalize reduction in sensation of back "needing to pop" Baseline: constant at eval Goal status: INITIAL    PLAN:  PT FREQUENCY: 1x/week  PT DURATION: 12 weeks  PLANNED INTERVENTIONS: Therapeutic exercises, Therapeutic activity, Neuromuscular re-education, Balance training, Gait training, Patient/Family education, Self Care, Joint mobilization, Stair training, Aquatic Therapy, Dry Needling, Spinal mobilization, Cryotherapy, Moist heat, Taping, Ionotophoresis 4mg /ml Dexamethasone, Manual therapy, and Re-evaluation.  PLAN FOR NEXT SESSION: Continue dry needling as needed, right SI joint mobilizations   Riki Altes, PTA  09/05/22 11:31 AM  [facility] [address] [place], [place], [address] Phone: [phone]   Fax:   [phone]     Check all possible CPT codes: [phone] - PT Re-evaluation, 97110- Therapeutic Exercise, [phone]- Neuro Re-education, [phone] - Gait Training, [phone] - Manual Therapy, [phone] - Therapeutic Activities, [phone] - Self Care, [phone] - Iontophoresis, and [id number] - Aquatic therapy    Check all conditions that are expected to impact treatment: Musculoskeletal disorders   If treatment provided at initial evaluation, no treatment charged due to lack of authorization.

## 2022-09-06 NOTE — Addendum Note (Signed)
Addended by: Willia Craze on: 09/06/2022 04:19 PM   Modules accepted: Orders

## 2022-09-09 ENCOUNTER — Ambulatory Visit (HOSPITAL_BASED_OUTPATIENT_CLINIC_OR_DEPARTMENT_OTHER): Payer: Medicaid Other | Admitting: Physical Therapy

## 2022-09-09 ENCOUNTER — Encounter (HOSPITAL_BASED_OUTPATIENT_CLINIC_OR_DEPARTMENT_OTHER): Payer: Self-pay | Admitting: Physical Therapy

## 2022-09-09 DIAGNOSIS — M6281 Muscle weakness (generalized): Secondary | ICD-10-CM | POA: Diagnosis not present

## 2022-09-09 DIAGNOSIS — M5459 Other low back pain: Secondary | ICD-10-CM

## 2022-09-09 DIAGNOSIS — R262 Difficulty in walking, not elsewhere classified: Secondary | ICD-10-CM

## 2022-09-09 NOTE — Therapy (Signed)
3 OUTPATIENT PHYSICAL THERAPY TREATMENT   Patient Name: Ellen Booker MRN: [id number] DOB:02/14/1981, 42 y.o., female Today's Date: 09/09/2022  END OF SESSION:  PT End of Session - 09/09/22 1436     Visit Number 15    Number of Visits 23    Date for PT Re-Evaluation 11/09/22    Authorization Type [place] [employer]    Authorization - Number of Visits 27    PT Start Time 1434    PT Stop Time 1512    PT Time Calculation (min) 38 min    Activity Tolerance Patient tolerated treatment well    Behavior During Therapy WFL for tasks assessed/performed                   Past Medical History:  Diagnosis Date   Abdominal wall mass    left side   Anemia    Anxiety    Depression    Elevated cholesterol    GERD (gastroesophageal reflux disease)    History of Helicobacter pylori infection    per pt 2016   Renal cyst, right    per CT 04-04-2016   Vaginal Pap smear, abnormal    Wears glasses    Past Surgical History:  Procedure Laterality Date   back  08/2021   CESAREAN SECTION  2010   COLONOSCOPY  last one 06-19-2015   EXCISION ABDOMINAL WALL MASS  2012   right side   EXCISION MASS ABDOMINAL N/A 05/21/2016   Procedure: EXCISION ABDOMINAL WALL MASS;  Surgeon: Rodman Pickle, MD;  Location: [facility];  Service: General;  Laterality: N/A;   RESECTION OF ABDOMINAL MASS     Patient Active Problem List   Diagnosis Date Noted   Dysmenorrhea 02/01/2014   Tobacco dependency 02/01/2014   Obesity 12/09/2013   Essential hypertension, benign 12/09/2013   Candidiasis of vulva and vagina 12/08/2013   Bilateral nipple discharge 11/08/2013   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   Unspecified disorder of menstruation and other abnormal bleeding from female genital tract 11/08/2013   Irregular menstrual cycle 11/08/2013   UTI (lower urinary tract infection) 01/29/2013   HSV (herpes simplex virus) infection 01/26/2013   Well woman exam with routine  gynecological exam 01/26/2013   BMI 40.0-44.9, adult (HCC) 01/26/2013   Elevated BP 01/26/2013    REFERRING PROVIDER:  Jonetta Speak, NP   REFERRING DIAG:  Diagnosis  Z98.1 (ICD-10-CM) - Arthrodesis status  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 I have learned how guarded I am and I have learned to breathe to allow to do more exercises. I am having a hard time with the strengthening- I can do the exercises but cannot hold or do high reps of strength. Having a very hard time doing anything for over 20-30 min, sitting for maybe 1 hour. I have tried getting up every 40 min. My hips feel very tight and it is painful to sit for an extended period of time. I took my daughter to a concert- had to walk from car, up stairs and out at the end; my legs were exhausted.   PERTINENT HISTORY:  Anxiety, h/o c-section  PAIN:  Are you having pain? Yes: NPRS scale: 4, 6 at worst/10 Pain location: pointing just lateral to Rt SIJ Pain description: tightness- rubbing from Rt SIJ to Rt hip Aggravating factors: long stride Relieving factors: pain meds, laying on left side.   PRECAUTIONS: None  WEIGHT BEARING RESTRICTIONS: No  FALLS:  Has patient fallen in last 6 months? Yes. Number of falls 1 6/17: fell about 2 weeks down an escalator  LIVING ENVIRONMENT: Lives with: lives with an adult companion Lives  in: House/apartment Stairs: yes Has following equipment at home: Single point cane and Environmental consultant - 4 wheeled  OCCUPATION: not working  PLOF: Independent with basic ADLs  PATIENT GOALS: lift leg for more than a couple of seconds without shaking, regain strength, relieve sensation that back needs to pop   OBJECTIVE:   DIAGNOSTIC FINDINGS:  MRI 09/30/20: IMPRESSION: Status post L5-S1 decompression. A large right paracentral and subarticular recess disc protrusion impinges on the right S1 root and deforms the right side of the thecal sac. There is also moderate right foraminal narrowing at this level.  Mild central canal and bilateral foraminal narrowing L4-5.  Xray lumbar completed 04/22/22.  PATIENT SURVEYS:  EVAL: LEFS: 21/80 09/09/22 LEFS: 17/80  COGNITIVE STATUS: Within functional limits for tasks assessed   POSTURE:  Eval: bil genu valgus and pronated through feet 6/17: Lt foot pronation, no valgus notable  GAIT: No AD at eval with slow cadence, short step length Comments: discussed trekking poles vs [employer]  6/17: with SPC in left hand, step through bilaterally, no scissoring, leaning over cane resulting in left lumbar shift   Body Part #1 Lumbar   Lumbar ROM LUMBAR ROM:   Active  A/PROM  eval AROM 6/17  Flexion Knee joint line Proximal 1/3 shin  Extension    Right lateral flexion Joint line Joint line  Left lateral flexion Joint line Joint line  Right rotation    Left rotation     (Blank rows = not tested)       Body Part #2 Hip//knee   LE MMT LOWER EXTREMITY MMT:    MMT Right eval Left eval Rt//Lt 6/17  Hip flexion 19.7 18.8 22.8/28.2  Hip extension     Hip abduction 28.5 21.5 27.5/24.5  Hip adduction     Hip internal rotation     Hip external rotation     Knee flexion 38.7 24.9 14.2/19.8  Knee extension 31.3 28.1 23.6/23.3   (Blank rows = not tested)     FUNCTIONAL TESTS:  30s chair stand: 5.5 wihtout UEs SLS: bil 2-3 s with ability to  control balance correction without UEs 09/09/22: 30s sit to stand: 7 without use of UEs SLS: Rt 2-3 s, Lt  6s with ability correct LOB independently  TREATMENT:                                                                                                                               Treatment                            6/17:  Balance and functional testing Discussion regarding progression of POC   Treatment                            6/13: Manual HSS R Manual Piriformis stretch R LTR x15ea  Hooklying PPT 2x10 5" hold Sidelying clams- 3" hold 2x10 ea  Partial lunges x10ea  Heel raise (R) Discussion of HEP and PT plan.  Treatment                            6/10:  Manual HSS R Manual Piriformis stretch R DKTC with red physioball x20 Cupping- static and gliding techniques- prone lying-6 cups to low back  2 cups on lumbar region- cat/cow x5ea  LTR x15ea with red physioball Hooklying PPT 2x10 5" hold  Mini squats 2x10 Side stepping at rail x 1 lap (some discomfort going to the R).     PATIENT EDUCATION:  Education details:  Anatomy of condition, POC, HEP, exercise form/rationale, pain neuroscience Person educated: Patient Education method: Explanation, Demonstration, Tactile cues, Verbal cues, Education comprehension: verbalized understanding, returned demonstration, verbal cues required, tactile cues required, and needs further education  HOME EXERCISE PROGRAM: [id number]   ASSESSMENT:  CLINICAL IMPRESSION: Pt has made inconsistent progress in strength- able to perform more sit<>stand in 30s but significant reduction in hamstring activation strength. Pt reports she is not doing her exercises daily. At this time we will decrease frequency to 1/week in order for pt to work consistently on strength via HEP with plans to progress at each visit.    OBJECTIVE IMPAIRMENTS: Abnormal gait, decreased activity tolerance, decreased balance, difficulty walking, decreased  strength, impaired perceived functional ability, increased muscle spasms, impaired flexibility, improper body mechanics, postural dysfunction, and pain.   ACTIVITY LIMITATIONS: carrying, lifting, bending, sitting, standing, squatting, stairs, transfers, bathing, dressing, locomotion level, and caring for others  PARTICIPATION LIMITATIONS: meal prep, cleaning, laundry, shopping, community activity, occupation, and yard work  PERSONAL FACTORS: Time since onset of injury/illness/exacerbation and anxiety, h/o c-section  are also affecting patient's functional outcome.   REHAB POTENTIAL: Good  CLINICAL DECISION MAKING: Evolving/moderate complexity  EVALUATION COMPLEXITY: Moderate   GOALS: Goals reviewed with patient? Yes  SHORT TERM GOALS: Target date: 4/19  Pt will be compliant with daily HEP performance Baseline: began establishing at eval Goal status: IN PROGRESS  2.  Avoiding sustained seated positions greater than 45 min when able Baseline: reports pain with 1.5 hr of sitting and encouraged  her to avoid greater than 45 min throughout the day Goal status: INITIAL   LONG TERM GOALS: Target date: POC date  LEFS to improve by MDC Baseline: 9 point MDC Goal status: INITIAL  2.  Able to walk through [facility] without feeling shaky Baseline: she feels like this is her most visited store as she orders groceries online Goal status: INITIAL  3.  Gross MMT to improve by 10% Baseline: see obj Goal status: INITIAL  4.  Pt will verbalize reduction in sensation of back "needing to pop" Baseline: constant at eval Goal status: INITIAL    PLAN:  PT FREQUENCY: 1x/week  PT DURATION: 12 weeks  PLANNED INTERVENTIONS: Therapeutic exercises, Therapeutic activity, Neuromuscular re-education, Balance training, Gait training, Patient/Family education, Self Care, Joint mobilization, Stair training, Aquatic Therapy, Dry Needling, Spinal mobilization, Cryotherapy, Moist heat, Taping,  Ionotophoresis 4mg /ml Dexamethasone, Manual therapy, and Re-evaluation.  PLAN FOR NEXT SESSION: Continue dry needling as needed, right SI joint mobilizations   Jodie Leiner C. Jakyrie Totherow PT, DPT 09/09/22 8:01 PM      Check all possible CPT codes: 16109 - PT Re-evaluation, 97110- Therapeutic Exercise, [phone]- Neuro Re-education, [phone] - Gait Training, [phone] - Manual Therapy, [phone] - Therapeutic Activities, [phone] - Self Care, [phone] - Iontophoresis, and [phone] - Aquatic therapy    Check all conditions that are expected to impact treatment: Musculoskeletal disorders   If treatment provided at initial evaluation, no treatment charged due to lack of authorization.

## 2022-09-11 ENCOUNTER — Encounter (HOSPITAL_BASED_OUTPATIENT_CLINIC_OR_DEPARTMENT_OTHER): Payer: Medicaid Other | Admitting: Physical Therapy

## 2022-09-16 ENCOUNTER — Encounter (HOSPITAL_BASED_OUTPATIENT_CLINIC_OR_DEPARTMENT_OTHER): Payer: Self-pay

## 2022-09-16 ENCOUNTER — Ambulatory Visit (HOSPITAL_COMMUNITY): Payer: Medicaid Other

## 2022-09-16 ENCOUNTER — Ambulatory Visit (HOSPITAL_BASED_OUTPATIENT_CLINIC_OR_DEPARTMENT_OTHER): Payer: Medicaid Other

## 2022-09-16 DIAGNOSIS — M6281 Muscle weakness (generalized): Secondary | ICD-10-CM | POA: Diagnosis not present

## 2022-09-16 DIAGNOSIS — R262 Difficulty in walking, not elsewhere classified: Secondary | ICD-10-CM

## 2022-09-16 DIAGNOSIS — M5459 Other low back pain: Secondary | ICD-10-CM

## 2022-09-16 NOTE — Therapy (Signed)
3 OUTPATIENT PHYSICAL THERAPY TREATMENT   Patient Name: Ellen Booker MRN: [id number] DOB:1981/01/07, 42 y.o., female Today's Date: 09/16/2022  END OF SESSION:  PT End of Session - 09/16/22 1346     Visit Number 16    Number of Visits 23    Date for PT Re-Evaluation 11/09/22    Authorization Type [place] [employer]    Authorization - Number of Visits 27    PT Start Time 1348    PT Stop Time 1426    PT Time Calculation (min) 38 min    Activity Tolerance Patient tolerated treatment well    Behavior During Therapy WFL for tasks assessed/performed               Past Medical History:  Diagnosis Date   Abdominal wall mass    left side   Anemia    Anxiety    Depression    Elevated cholesterol    GERD (gastroesophageal reflux disease)    History of Helicobacter pylori infection    per pt 2016   Renal cyst, right    per CT 04-04-2016   Vaginal Pap smear, abnormal    Wears glasses    Past Surgical History:  Procedure Laterality Date   back  08/2021   CESAREAN SECTION  2010   COLONOSCOPY  last one 06-19-2015   EXCISION ABDOMINAL WALL MASS  2012   right side   EXCISION MASS ABDOMINAL N/A 05/21/2016   Procedure: EXCISION ABDOMINAL WALL MASS;  Surgeon: Rodman Pickle, MD;  Location: [facility];  Service: General;  Laterality: N/A;   RESECTION OF ABDOMINAL MASS     Patient Active Problem List   Diagnosis Date Noted   Dysmenorrhea 02/01/2014   Tobacco dependency 02/01/2014   Obesity 12/09/2013   Essential hypertension, benign 12/09/2013   Candidiasis of vulva and vagina 12/08/2013   Bilateral nipple discharge 11/08/2013   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   Unspecified disorder of menstruation and other abnormal bleeding from female genital tract 11/08/2013   Irregular menstrual cycle 11/08/2013   UTI (lower urinary tract infection) 01/29/2013   HSV (herpes simplex virus) infection 01/26/2013   Well woman exam with routine gynecological exam  01/26/2013   BMI 40.0-44.9, adult (HCC) 01/26/2013   Elevated BP 01/26/2013    REFERRING PROVIDER:  Jonetta Speak, NP   REFERRING DIAG:  Diagnosis  Z98.1 (ICD-10-CM) - Arthrodesis status  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 She reports HEP compliance. Has disability assessment sometime next month.   PERTINENT HISTORY:  Anxiety, h/o c-section  PAIN:  Are you having pain? Yes: NPRS scale: 4, 6 at worst/10 Pain location: pointing just lateral to Rt SIJ Pain description: tightness- rubbing from Rt SIJ to Rt hip Aggravating factors: long stride Relieving factors: pain meds, laying on left side.   PRECAUTIONS: None  WEIGHT BEARING RESTRICTIONS: No  FALLS:  Has patient fallen in last 6 months? Yes. Number of falls 1 6/17: fell about 2 weeks down an escalator  LIVING ENVIRONMENT: Lives with: lives with an adult companion Lives in: House/apartment Stairs: yes Has following equipment at home: Single point cane and Environmental consultant - 4 wheeled  OCCUPATION: not working  PLOF: Independent with basic ADLs  PATIENT GOALS: lift leg for more than a couple of seconds without shaking, regain strength, relieve sensation that back needs to pop   OBJECTIVE:   DIAGNOSTIC FINDINGS:  MRI 09/30/20: IMPRESSION: Status post L5-S1 decompression. A large right paracentral and subarticular recess disc protrusion impinges on the right S1  root and deforms the right side of the thecal sac. There is also moderate right foraminal narrowing at this level.  Mild central canal and bilateral foraminal narrowing L4-5.  Xray lumbar completed 04/22/22.  PATIENT SURVEYS:  EVAL: LEFS: 21/80 09/09/22 LEFS: 17/80  COGNITIVE STATUS: Within functional limits for tasks assessed   POSTURE:  Eval: bil genu valgus and pronated through feet 6/17: Lt foot pronation, no valgus notable  GAIT: No AD at eval with slow cadence, short step length Comments: discussed trekking poles vs [facility]  6/17: with SPC in left hand, step through bilaterally, no scissoring, leaning over cane resulting in left lumbar shift   Body Part #1 Lumbar   Lumbar ROM LUMBAR ROM:   Active  A/PROM  eval AROM 6/17  Flexion Knee joint line Proximal 1/3 shin  Extension    Right lateral flexion Joint line Joint line  Left lateral flexion Joint line Joint line  Right rotation    Left rotation     (Blank rows = not tested)       Body Part #2 Hip//knee   LE MMT LOWER EXTREMITY MMT:    MMT Right eval Left eval Rt//Lt 6/17  Hip flexion 19.7 18.8 22.8/28.2  Hip extension     Hip abduction 28.5 21.5 27.5/24.5  Hip adduction     Hip internal rotation     Hip external rotation     Knee flexion 38.7 24.9 14.2/19.8  Knee extension 31.3 28.1 23.6/23.3   (Blank rows = not tested)     FUNCTIONAL TESTS:  30s chair stand: 5.5 wihtout UEs SLS: bil 2-3 s with ability to control balance correction without UEs 09/09/22: 30s sit to stand: 7 without use of UEs SLS: Rt 2-3 s, Lt 6s with ability correct LOB independently  TREATMENT:  Treatment                            6/24: Seated lumbar stretch with hands on stool- 5" x10 Lateral flexion stretch 10sec x8ea (seated on corner of plinth) Heel raises 2x10  Partial squats 2x10 (cues for  form) HS/adductor stretch at bottom stair Step ups 4" 2x10 ea Leg extension machine- 10lbs 2x10 Discussion of HEP and POC   Treatment                            6/17:  Balance and functional testing Discussion regarding progression of POC   Treatment                            6/13: Manual HSS R Manual Piriformis stretch R LTR x15ea  Hooklying PPT 2x10 5" hold Sidelying clams- 3" hold 2x10 ea  Partial lunges x10ea  Heel raise (R) Discussion of HEP and PT plan.    PATIENT EDUCATION:  Education details:  Anatomy of condition, POC, HEP, exercise form/rationale, pain neuroscience Person educated: Patient Education method: Explanation, Demonstration, Tactile cues, Verbal cues, Education comprehension: verbalized understanding, returned demonstration, verbal cues required, tactile cues required, and needs further education  HOME EXERCISE PROGRAM: [id number]   ASSESSMENT:  CLINICAL IMPRESSION: Pt had good tolerance for progressions to LE strengthening. She demonstrates fasciculations with strengthening exercises in B LES. Added leg extension machine today which pt did well with overall, though she was challenged. Educated pt on performance of lumbar extensor stretching daily as well as progressions to walking distance. Pt states she would like to take a few weeks off from PT to work on HEP and then return to follow up next month.    OBJECTIVE IMPAIRMENTS: Abnormal gait, decreased activity tolerance, decreased balance, difficulty walking, decreased strength, impaired perceived functional ability, increased muscle spasms, impaired flexibility, improper body mechanics, postural dysfunction, and pain.   ACTIVITY LIMITATIONS: carrying, lifting, bending, sitting, standing, squatting, stairs, transfers, bathing, dressing, locomotion level, and caring for others  PARTICIPATION LIMITATIONS: meal prep, cleaning, laundry, shopping, community activity, occupation, and yard work  PERSONAL  FACTORS: Time since onset of injury/illness/exacerbation and anxiety, h/o c-section  are also affecting patient's functional outcome.   REHAB POTENTIAL: Good  CLINICAL DECISION MAKING: Evolving/moderate complexity  EVALUATION COMPLEXITY: Moderate   GOALS: Goals reviewed with patient? Yes  SHORT TERM GOALS: Target date: 4/19  Pt will be compliant with daily HEP performance Baseline: began establishing at eval Goal status: IN PROGRESS  2.  Avoiding sustained seated positions greater than 45 min when able Baseline: reports pain with 1.5 hr of sitting and encouraged her to avoid greater than 45 min throughout the day Goal status: INITIAL   LONG TERM GOALS: Target date: POC date  LEFS to improve by MDC Baseline: 9 point MDC Goal status: INITIAL  2.  Able to walk through [facility] without feeling shaky Baseline: she feels like this is her most visited store as she orders groceries online Goal status: INITIAL  3.  Gross MMT to improve by 10% Baseline: see obj Goal status: INITIAL  4.  Pt will verbalize reduction in sensation of back "needing to pop" Baseline: constant at eval Goal status: INITIAL    PLAN:  PT FREQUENCY: 1x/week  PT DURATION: 12 weeks  PLANNED INTERVENTIONS: Therapeutic exercises, Therapeutic activity, Neuromuscular re-education, Balance training, Gait training, Patient/Family  education, Self Care, Joint mobilization, Stair training, Aquatic Therapy, Dry Needling, Spinal mobilization, Cryotherapy, Moist heat, Taping, Ionotophoresis 4mg /ml Dexamethasone, Manual therapy, and Re-evaluation.  PLAN FOR NEXT SESSION: Continue dry needling as needed, right SI joint mobilizations   Riki Altes, PTA  09/16/22 2:28 PM      Check all possible CPT codes: 40981 - PT Re-evaluation, 97110- Therapeutic Exercise, [phone]- Neuro Re-education, [phone] - Gait Training, [phone] - Manual Therapy, [phone] - Therapeutic Activities, [phone] - Self Care, [phone] - Iontophoresis,  and [phone] - Aquatic therapy    Check all conditions that are expected to impact treatment: Musculoskeletal disorders   If treatment provided at initial evaluation, no treatment charged due to lack of authorization.

## 2022-09-19 ENCOUNTER — Encounter: Payer: Self-pay | Admitting: Obstetrics & Gynecology

## 2022-09-19 ENCOUNTER — Ambulatory Visit: Payer: Medicaid Other | Admitting: Obstetrics & Gynecology

## 2022-09-19 VITALS — BP 134/90 | HR 91 | Ht 63.0 in | Wt 215.5 lb

## 2022-09-19 DIAGNOSIS — N80129 Deep endometriosis of ovary, unspecified ovary: Secondary | ICD-10-CM | POA: Diagnosis not present

## 2022-09-19 DIAGNOSIS — N926 Irregular menstruation, unspecified: Secondary | ICD-10-CM | POA: Diagnosis not present

## 2022-09-19 NOTE — Progress Notes (Signed)
Pt wants to discuss cyst removal surgery and endometriosis.

## 2022-09-19 NOTE — Progress Notes (Deleted)
Subjective:    Ellen Booker is a [id number] Unknown being seen today for her first obstetrical visit.  Her obstetrical history is significant for {ob risk factors:10154}. Patient {does/does not:19097} intend to breast feed. Pregnancy history fully reviewed.  Patient reports {sx:14538}.  Vitals:   09/19/22 1458  BP: (!) 134/90  Pulse: 91  Weight: 215 lb 8 oz (97.8 kg)  Height: 5\' 3"  (1.6 m)    HISTORY: OB History  Gravida Para Term Preterm AB Living  3 1 1   1 1   SAB IAB Ectopic Multiple Live Births  1       1    # Outcome Date GA Lbr Len/2nd Weight Sex Delivery Anes PTL Lv  3 Gravida           2 SAB           1 Term  [redacted]w[redacted]d    CS-LTranv   LIV   Past Medical History:  Diagnosis Date   Abdominal wall mass    left side   Anemia    Anxiety    Depression    Elevated cholesterol    GERD (gastroesophageal reflux disease)    History of Helicobacter pylori infection    per pt 2016   Renal cyst, right    per CT 04-04-2016   Vaginal Pap smear, abnormal    Wears glasses    Past Surgical History:  Procedure Laterality Date   back  08/2021   CESAREAN SECTION  2010   COLONOSCOPY  last one 06-19-2015   EXCISION ABDOMINAL WALL MASS  2012   right side   EXCISION MASS ABDOMINAL N/A 05/21/2016   Procedure: EXCISION ABDOMINAL WALL MASS;  Surgeon: De Blanch Kinsinger, MD;  Location: [facility];  Service: General;  Laterality: N/A;   RESECTION OF ABDOMINAL MASS     Family History  Problem Relation Age of Onset   Uterine cancer Mother    HIV Father    Cancer Maternal Grandmother    Uterine cancer Other    Liver cancer Neg Hx    Esophageal cancer Neg Hx    Colon cancer Neg Hx      Exam    Uterus:     Pelvic Exam:    Perineum: {Exam; anus and perineum:14598}   Vulva: {vulva exam:14487}   Vagina:  {vaginal exam:14498}   pH: ***   Cervix: {cervix exam:14595}   Adnexa: {adnexa:12223}   Bony Pelvis: {desc; pelvis:14491}  System: Breast:  {Exam;  breast:13139::"normal appearance, no masses or tenderness"}   Skin: {pe skin brief PP:295188}    Neurologic: {Exam; neuro:16375}   Extremities: {Exam; extremities:15096}   HEENT {exam; CZYSA:63016}   Mouth/Teeth {pe mouth simple ob:314450}   Neck {Neck (ob):32092}   Cardiovascular: {HEART EXAM HEM/ONC:21750}   Respiratory:  {Exam; respiratory:5782}   Abdomen: {Exam; abdomen:16834}   Urinary: {exam; urinary female:30845}      Assessment:    Pregnancy: G3P1011 Patient Active Problem List   Diagnosis Date Noted   Dysmenorrhea 02/01/2014   Tobacco dependency 02/01/2014   Obesity 12/09/2013   Essential hypertension, benign 12/09/2013   Candidiasis of vulva and vagina 12/08/2013   Bilateral nipple discharge 11/08/2013   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   Unspecified disorder of menstruation and other abnormal bleeding from female genital tract 11/08/2013   Irregular menstrual cycle 11/08/2013   UTI (lower urinary tract infection) 01/29/2013   HSV (herpes simplex virus) infection 01/26/2013   Well woman exam with routine gynecological exam 01/26/2013  BMI 40.0-44.9, adult (HCC) 01/26/2013   Elevated BP 01/26/2013        Plan:     Initial labs drawn. Prenatal vitamins. Problem list reviewed and updated. Genetic Screening discussed {GENETIC SCREENING TEST:22046}: {requests/ordered/declines:14581}.  Ultrasound discussed; fetal survey: {requests/ordered/declines:14581}.  Follow up in {numbers 0-4:31231} weeks. ***% of *** min visit spent on counseling and coordination of care.  ***   Scheryl Darter 09/19/2022

## 2022-09-19 NOTE — Progress Notes (Signed)
Patient ID: Ellen Booker, female   DOB: [birth year], 42 y.o.   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 female.  [id number] No LMP recorded. She was seen by Dr. Meda Klinefelter from GI and she had questions about her dx of abdominal wall endometrioma. She had excision of a lesion 2018 by Dr. Sheliah Hatch. She has done well since then and she has no menstrual problems. Pelvic US was normal with no masses  HPI  Past Medical History:  Diagnosis Date   Abdominal wall mass    left side   Anemia    Anxiety    Depression    Elevated cholesterol    GERD (gastroesophageal reflux disease)    History of Helicobacter pylori infection    per pt 2016   Renal cyst, right    per CT 04-04-2016   Vaginal Pap smear, abnormal    Wears glasses     Past Surgical History:  Procedure Laterality Date   back  08/2021   CESAREAN SECTION  2010   COLONOSCOPY  last one 06-19-2015   EXCISION ABDOMINAL WALL MASS  2012   right side   EXCISION MASS ABDOMINAL N/A 05/21/2016   Procedure: EXCISION ABDOMINAL WALL MASS;  Surgeon: De Blanch Kinsinger, MD;  Location: [facility];  Service: General;  Laterality: N/A;   RESECTION OF ABDOMINAL MASS      Family History  Problem Relation Age of Onset   Uterine cancer Mother    HIV Father    Cancer Maternal Grandmother    Uterine cancer Other    Liver cancer Neg Hx    Esophageal cancer Neg Hx    Colon cancer Neg Hx     Social History Social History   Tobacco Use   Smoking status: Every Day    Packs/day: 0.50    Years: 19.00    Additional pack years: 0.00    Total pack years: 9.50    Types: Cigarettes   Smokeless tobacco: Never  Vaping Use   Vaping Use: Never used  Substance Use Topics   Alcohol use: No    Comment: occ   Drug use: Yes    Types: Marijuana    Comment: last smoked 08/14/22    No Known Allergies  Current Outpatient  Medications  Medication Sig Dispense Refill   buPROPion (WELLBUTRIN XL) 150 MG 24 hr tablet Take 150 mg by mouth daily.     lamoTRIgine (LAMICTAL) 150 MG tablet Take 150 mg by mouth 2 (two) times daily.     METFORMIN & DIET MANAGE PROD PO      oxyCODONE-acetaminophen (PERCOCET/ROXICET) 5-325 MG tablet Take 1 tablet by mouth every 4 (four) hours as needed.     rosuvastatin (CRESTOR) 10 MG tablet Take by mouth.     tiZANidine (ZANAFLEX) 4 MG tablet Take 1 tablet (4 mg total) by mouth every 6 (six) hours as needed for muscle spasms. 30 tablet 3   topiramate (TOPAMAX) 50 MG tablet Take 50 mg by mouth 2 (two) times daily.     traZODone (DESYREL) 100 MG tablet Take 100 mg by mouth at bedtime.     TRINTELLIX 10 MG TABS tablet Take 20 mg by mouth once.     varenicline (CHANTIX) 0.5 MG tablet Take 0.5 mg by mouth 2 (two) times daily.     VRAYLAR 3 MG capsule Take 3 mg by  mouth daily.     VYVANSE 50 MG capsule Take 50 mg by mouth every morning.     No current facility-administered medications for this visit.    Review of Systems Review of Systems  Constitutional: Negative.   Respiratory: Negative.    Cardiovascular: Negative.   Gastrointestinal: Negative.   Genitourinary: Negative.     Blood pressure (!) 134/90, pulse 91, height 5\' 3"  (1.6 m), weight 215 lb 8 oz (97.8 kg).  Physical Exam Physical Exam Vitals and nursing note reviewed.  Constitutional:      Appearance: Normal appearance.  HENT:     Head: Normocephalic and atraumatic.  Cardiovascular:     Rate and Rhythm: Normal rate.  Pulmonary:     Effort: Pulmonary effort is normal.  Musculoskeletal:     Cervical back: Normal range of motion.  Neurological:     Mental Status: She is alert.  Psychiatric:        Mood and Affect: Mood normal.        Behavior: Behavior normal.     Data Reviewed US pelvis  Narrative & Impression  CLINICAL DATA:  Menorrhagia   EXAM: TRANSABDOMINAL AND TRANSVAGINAL ULTRASOUND OF PELVIS    TECHNIQUE: Both transabdominal and transvaginal ultrasound examinations of the pelvis were performed. Transabdominal technique was performed for global imaging of the pelvis including uterus, ovaries, adnexal regions, and pelvic cul-de-sac. It was necessary to proceed with endovaginal exam following the transabdominal exam to visualize the endometrium and ovaries.   COMPARISON:  None Available.   FINDINGS: Uterus   Measurements: 8.1 x 4.5 x 6 cm = volume: 113.6 mL. No fibroids or other mass visualized.   Endometrium   Thickness: 6.8 mm.  No focal abnormality visualized.   Right ovary   Measurements: 3.7 x 1.6 x 3.2 cm = volume: 10.1 mL. Normal appearance/no adnexal mass.   Left ovary   Measurements: 3.6 x 1.7 x 2.4 cm = volume: 7.4 mL. Normal appearance/no adnexal mass.   Other findings   No abnormal free fluid.   IMPRESSION: No sonographic abnormalities are seen in pelvis.     Electronically Signed   By: Ernie Avena M.D.   On: 05/30/2022 10:09   Assessment H/O abdominal wall endometrioma with no sign of pelvic disease  Plan Routine Gyn f/u    Scheryl Darter 09/19/2022, 3:22 PM

## 2022-09-23 ENCOUNTER — Encounter: Payer: Self-pay | Admitting: Radiology

## 2022-09-23 ENCOUNTER — Encounter (HOSPITAL_BASED_OUTPATIENT_CLINIC_OR_DEPARTMENT_OTHER): Payer: Medicaid Other

## 2022-09-27 ENCOUNTER — Ambulatory Visit (HOSPITAL_COMMUNITY)
Admission: RE | Admit: 2022-09-27 | Discharge: 2022-09-27 | Disposition: A | Discharge status: Home / Self Care | Payer: Medicaid Other | Source: Ambulatory Visit | Attending: Gastroenterology | Admitting: Gastroenterology

## 2022-09-27 DIAGNOSIS — R1314 Dysphagia, pharyngoesophageal phase: Secondary | ICD-10-CM

## 2022-09-28 ENCOUNTER — Encounter: Payer: Self-pay | Admitting: Gastroenterology

## 2022-09-28 DIAGNOSIS — R1111 Vomiting without nausea: Secondary | ICD-10-CM

## 2022-10-01 ENCOUNTER — Encounter (HOSPITAL_BASED_OUTPATIENT_CLINIC_OR_DEPARTMENT_OTHER): Payer: Medicaid Other

## 2022-10-01 NOTE — Telephone Encounter (Signed)
Appreciate the update.  I did not know that she had been having vomiting as it was not mentioned at the May office visit nor at the recent upper endoscopy appointment.   She has delayed gastric emptying, though does not seem to have clear risk factors for that.   Recommend gastric emptying study.  HD

## 2022-10-01 NOTE — Telephone Encounter (Signed)
GES order in epic. Secure staff message sent to radiology scheduling to contact patient to set up appt. 

## 2022-10-07 ENCOUNTER — Encounter (HOSPITAL_BASED_OUTPATIENT_CLINIC_OR_DEPARTMENT_OTHER): Payer: Medicaid Other

## 2022-10-14 ENCOUNTER — Encounter (HOSPITAL_BASED_OUTPATIENT_CLINIC_OR_DEPARTMENT_OTHER): Payer: Medicaid Other

## 2022-10-15 ENCOUNTER — Encounter (HOSPITAL_COMMUNITY)
Admission: RE | Admit: 2022-10-15 | Discharge: 2022-10-15 | Disposition: A | Discharge status: Home / Self Care | Payer: Medicaid Other | Source: Ambulatory Visit | Attending: Gastroenterology | Admitting: Gastroenterology

## 2022-10-15 ENCOUNTER — Encounter (HOSPITAL_COMMUNITY): Payer: Self-pay

## 2022-10-15 DIAGNOSIS — R1111 Vomiting without nausea: Secondary | ICD-10-CM | POA: Insufficient documentation

## 2022-10-15 MED ORDER — TECHNETIUM TC 99M SULFUR COLLOID
2.0000 | Freq: Once | INTRAVENOUS | Status: AC | PRN
  Administered 2022-10-15: 2 via INTRAVENOUS

## 2022-10-17 ENCOUNTER — Encounter: Payer: Self-pay | Admitting: Gastroenterology

## 2022-10-17 NOTE — Telephone Encounter (Signed)
I don't know if we can request other food for a GES.  I think I have seen oatmeal used, so I suppose we can request that for a re-schedule.  - HD

## 2022-10-18 NOTE — Telephone Encounter (Signed)
Secure staff message sent to radiology scheduling to contact pt to reschedule GES. Requested that patient have oatmeal instead of eggs. Pt notified via MyChart

## 2022-10-21 ENCOUNTER — Encounter (HOSPITAL_BASED_OUTPATIENT_CLINIC_OR_DEPARTMENT_OTHER): Payer: Medicaid Other

## 2022-10-23 ENCOUNTER — Encounter (HOSPITAL_BASED_OUTPATIENT_CLINIC_OR_DEPARTMENT_OTHER): Payer: Self-pay | Admitting: Physical Therapy

## 2022-10-28 ENCOUNTER — Encounter (HOSPITAL_BASED_OUTPATIENT_CLINIC_OR_DEPARTMENT_OTHER): Payer: Medicaid Other

## 2022-10-29 ENCOUNTER — Encounter: Payer: Self-pay | Admitting: Orthopedic Surgery

## 2022-10-29 ENCOUNTER — Other Ambulatory Visit: Payer: Self-pay | Admitting: Orthopedic Surgery

## 2022-10-31 ENCOUNTER — Ambulatory Visit (HOSPITAL_COMMUNITY): Admission: RE | Admit: 2022-10-31 | Payer: Medicaid Other | Source: Ambulatory Visit

## 2022-10-31 ENCOUNTER — Encounter (HOSPITAL_COMMUNITY): Payer: Medicaid Other

## 2022-11-04 ENCOUNTER — Ambulatory Visit (HOSPITAL_BASED_OUTPATIENT_CLINIC_OR_DEPARTMENT_OTHER): Payer: Medicaid Other | Attending: Nurse Practitioner

## 2022-11-04 ENCOUNTER — Encounter (HOSPITAL_BASED_OUTPATIENT_CLINIC_OR_DEPARTMENT_OTHER): Payer: Self-pay

## 2022-11-04 DIAGNOSIS — M6281 Muscle weakness (generalized): Secondary | ICD-10-CM | POA: Diagnosis present

## 2022-11-04 DIAGNOSIS — R262 Difficulty in walking, not elsewhere classified: Secondary | ICD-10-CM | POA: Insufficient documentation

## 2022-11-04 DIAGNOSIS — M5459 Other low back pain: Secondary | ICD-10-CM | POA: Diagnosis present

## 2022-11-04 NOTE — Therapy (Signed)
3 OUTPATIENT PHYSICAL THERAPY TREATMENT   Patient Name: Ellen Booker MRN: [id number] DOB:[birth year], 42 y.o., female Today's Date: 11/04/2022  END OF SESSION:  PT End of Session - 11/04/22 1410     Visit Number 17    Number of Visits 23    Date for PT Re-Evaluation 11/09/22    Authorization Type [place] [employer]    Authorization - Number of Visits 27    PT Start Time 1345    PT Stop Time 1438    PT Time Calculation (min) 53 min    Activity Tolerance Patient tolerated treatment well    Behavior During Therapy WFL for tasks assessed/performed                Past Medical History:  Diagnosis Date   Abdominal wall mass    left side   Anemia    Anxiety    Depression    Elevated cholesterol    GERD (gastroesophageal reflux disease)    History of Helicobacter pylori infection    per pt 2016   Renal cyst, right    per CT 04-04-2016   Vaginal Pap smear, abnormal    Wears glasses    Past Surgical History:  Procedure Laterality Date   back  08/2021   CESAREAN SECTION  2010   COLONOSCOPY  last one 06-19-2015   EXCISION ABDOMINAL WALL MASS  2012   right side   EXCISION MASS ABDOMINAL N/A 05/21/2016   Procedure: EXCISION ABDOMINAL WALL MASS;  Surgeon: Rodman Pickle, MD;  Location: [facility];  Service: General;  Laterality: N/A;   RESECTION OF ABDOMINAL MASS     Patient Active Problem List   Diagnosis Date Noted   Endometrioma 09/19/2022   Dysmenorrhea 02/01/2014   Tobacco dependency 02/01/2014   Obesity 12/09/2013   Essential hypertension, benign 12/09/2013   Candidiasis of vulva and vagina 12/08/2013   Bilateral nipple discharge 11/08/2013   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   Unspecified disorder of menstruation and other abnormal bleeding from female genital tract 11/08/2013   Irregular menstrual cycle 11/08/2013   UTI (lower urinary tract infection) 01/29/2013   HSV (herpes simplex virus) infection 01/26/2013   Well woman exam  with routine gynecological exam 01/26/2013   BMI 40.0-44.9, adult (HCC) 01/26/2013   Elevated BP 01/26/2013    REFERRING PROVIDER:  Jonetta Speak, NP   REFERRING DIAG:  Diagnosis  Z98.1 (ICD-10-CM) - Arthrodesis status  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 She also tried crossing her legs recently which caused a burning pain in her back and she was unable to turn to the left after this. She has been trying to complete her exercises ~4x/week for the last 2-3 weeks. Pt has been on oxycodone and meloxicam since. She rates pain at 3/10. Has taken pain meds today.   PERTINENT HISTORY:  Anxiety, h/o c-section  PAIN:  Are you having pain? Yes: NPRS scale: 3/10 at worst/10 Pain location: pointing just lateral to Rt SIJ Pain description: tightness- rubbing from Rt SIJ to Rt hip Aggravating factors: long stride Relieving factors: pain meds, laying on left side.   PRECAUTIONS: None  WEIGHT BEARING RESTRICTIONS: No  FALLS:  Has patient fallen in last 6 months? Yes. Number of falls 1 6/17: fell about 2 weeks down an escalator  LIVING ENVIRONMENT: Lives with: lives with an adult companion Lives in: House/apartment Stairs: yes Has following equipment at home: Single point cane and Environmental consultant - 4 wheeled  OCCUPATION: not  working  PLOF: Independent with basic ADLs  PATIENT GOALS: lift leg for more than a couple of seconds without shaking, regain strength, relieve sensation that back needs to pop   OBJECTIVE:   DIAGNOSTIC FINDINGS:  MRI 09/30/20: IMPRESSION: Status post L5-S1 decompression. A large right paracentral and subarticular recess disc protrusion impinges on the right S1 root and deforms the right side of the thecal sac. There is also moderate right foraminal narrowing at this level.  Mild central canal and bilateral foraminal narrowing L4-5.  Xray lumbar completed 04/22/22.  PATIENT SURVEYS:  EVAL: LEFS: 21/80 09/09/22 LEFS: 17/80  COGNITIVE STATUS: Within functional limits for tasks assessed   POSTURE:  Eval: bil genu valgus and pronated through feet 6/17: Lt foot pronation, no valgus notable  GAIT: No AD at eval with slow cadence, short step length Comments: discussed trekking poles vs [facility]  6/17: with SPC in left hand, step through bilaterally, no scissoring, leaning over cane resulting in left lumbar shift   Body Part #1 Lumbar   Lumbar ROM LUMBAR ROM:   Active  A/PROM  eval AROM 6/17  Flexion Knee joint line Proximal 1/3 shin  Extension    Right lateral flexion Joint line Joint line  Left lateral flexion Joint line Joint line  Right rotation    Left rotation     (Blank rows = not tested)       Body Part #2 Hip//knee   LE MMT LOWER EXTREMITY MMT:    MMT Right eval Left eval Rt//Lt 6/17  Hip flexion 19.7 18.8 22.8/28.2  Hip extension     Hip abduction 28.5 21.5 27.5/24.5  Hip adduction     Hip internal rotation     Hip external rotation     Knee flexion 38.7 24.9 14.2/19.8  Knee extension 31.3 28.1 23.6/23.3   (Blank rows = not tested)     FUNCTIONAL TESTS:  30s chair stand: 5.5 wihtout UEs SLS: bil 2-3 s with ability to control balance correction without UEs 09/09/22: 30s sit to stand: 7 without use of UEs SLS: Rt 2-3 s, Lt 6s with ability  correct LOB independently  TREATMENT:  Treatment                           8/12 Cupping/STM to lumbar paraspinals in prone position  LTR 5" 2x10 Manual HSS 30sec x2 R SKTC 30sec x2ea Thread the needle stretch in quadruped 5" x5ea Standing rhomboid stretch at doorframe- instructed for HEP Child's pose stretch 10-15s x5   Treatment                            6/24: Seated lumbar stretch with hands on stool- 5" x10 Lateral flexion stretch 10sec x8ea (seated on corner of plinth) Heel raises 2x10  Partial squats 2x10 (cues for form) HS/adductor stretch at bottom stair Step ups 4" 2x10 ea Leg extension machine- 10lbs 2x10 Discussion of HEP and POC   Treatment                            6/17:  Balance and functional testing Discussion regarding progression of POC   Treatment                            6/13: Manual HSS R Manual Piriformis stretch R LTR x15ea  Hooklying PPT 2x10 5" hold Sidelying clams- 3" hold 2x10 ea  Partial lunges x10ea  Heel raise (R) Discussion of HEP and PT plan.    PATIENT EDUCATION:  Education details:  Anatomy of condition, POC, HEP, exercise form/rationale, pain neuroscience Person educated: Patient Education method: Explanation, Demonstration, Tactile cues, Verbal cues, Education comprehension: verbalized understanding, returned demonstration, verbal cues required, tactile cues required, and needs further education  HOME EXERCISE PROGRAM: [id number]   ASSESSMENT:  CLINICAL IMPRESSION: Pt limited with lumbar mobility due to pain/tightness. She had increased L sided lumbar tightness with R manual HSS. Performed Gentle STM and cupping to the area to reduce tightness and improve mobility. Instructed pt in self tennis ball release techniques to continue at home. Instructed pt in thread the needle stretch as well due to   thoracic mm tightness. Will continue to monitor tightness and pain level and progress as tolerated.    OBJECTIVE IMPAIRMENTS: Abnormal gait, decreased activity tolerance, decreased balance, difficulty walking, decreased strength, impaired perceived functional ability, increased muscle spasms, impaired flexibility, improper body mechanics, postural dysfunction, and pain.   ACTIVITY LIMITATIONS: carrying, lifting, bending, sitting, standing, squatting, stairs, transfers, bathing, dressing, locomotion level, and caring for others  PARTICIPATION LIMITATIONS: meal prep, cleaning, laundry, shopping, community activity, occupation, and yard work  PERSONAL FACTORS: Time since onset of injury/illness/exacerbation and anxiety, h/o c-section  are also affecting patient's functional outcome.   REHAB POTENTIAL: Good  CLINICAL DECISION MAKING: Evolving/moderate complexity  EVALUATION COMPLEXITY: Moderate   GOALS: Goals reviewed with patient? Yes  SHORT TERM GOALS: Target date: 4/19  Pt will be compliant with daily HEP performance Baseline: began establishing at eval Goal status: IN PROGRESS  2.  Avoiding sustained seated positions greater than 45 min when able Baseline: reports pain with 1.5 hr of sitting and encouraged her to avoid greater than 45 min throughout the day Goal status: INITIAL   LONG TERM GOALS: Target date: POC date  LEFS to improve by MDC Baseline: 9 point MDC Goal status: INITIAL  2.  Able to walk through [facility] without feeling shaky Baseline: she feels like this is her most visited store as she orders groceries  online Goal status: INITIAL  3.  Gross MMT to improve by 10% Baseline: see obj Goal status: INITIAL  4.  Pt will verbalize reduction in sensation of back "needing to pop" Baseline: constant at eval Goal status: INITIAL    PLAN:  PT FREQUENCY: 1x/week  PT DURATION: 12 weeks  PLANNED INTERVENTIONS: Therapeutic exercises, Therapeutic activity,  Neuromuscular re-education, Balance training, Gait training, Patient/Family education, Self Care, Joint mobilization, Stair training, Aquatic Therapy, Dry Needling, Spinal mobilization, Cryotherapy, Moist heat, Taping, Ionotophoresis 4mg /ml Dexamethasone, Manual therapy, and Re-evaluation.  PLAN FOR NEXT SESSION: Continue dry needling as needed, right SI joint mobilizations   Riki Altes, PTA  11/04/22 3:03 PM      Check all possible CPT codes: 03500 - PT Re-evaluation, 97110- Therapeutic Exercise, [phone]- Neuro Re-education, [phone] - Gait Training, [phone] - Manual Therapy, [phone] - Therapeutic Activities, [phone] - Self Care, [phone] - Iontophoresis, and [phone] - Aquatic therapy    Check all conditions that are expected to impact treatment: Musculoskeletal disorders   If treatment provided at initial evaluation, no treatment charged due to lack of authorization.

## 2022-11-07 ENCOUNTER — Encounter (HOSPITAL_BASED_OUTPATIENT_CLINIC_OR_DEPARTMENT_OTHER): Payer: Self-pay

## 2022-11-07 ENCOUNTER — Ambulatory Visit (HOSPITAL_BASED_OUTPATIENT_CLINIC_OR_DEPARTMENT_OTHER): Payer: Medicaid Other

## 2022-11-07 DIAGNOSIS — R262 Difficulty in walking, not elsewhere classified: Secondary | ICD-10-CM

## 2022-11-07 DIAGNOSIS — M6281 Muscle weakness (generalized): Secondary | ICD-10-CM | POA: Diagnosis not present

## 2022-11-07 DIAGNOSIS — M5459 Other low back pain: Secondary | ICD-10-CM

## 2022-11-07 NOTE — Therapy (Signed)
OUTPATIENT PHYSICAL THERAPY TREATMENT   Patient Name: Ellen Booker MRN: [id number] DOB:07-Dec-1980, 42 y.o., female Today's Date: 11/07/2022  END OF SESSION:  PT End of Session - 11/07/22 1107     Visit Number 18    Number of Visits 23    Date for PT Re-Evaluation 11/09/22    Authorization Type [place] [employer]    Authorization - Number of Visits 27    PT Start Time 1102    PT Stop Time 1145    PT Time Calculation (min) 43 min    Activity Tolerance Patient tolerated treatment well    Behavior During Therapy WFL for tasks assessed/performed                 Past Medical History:  Diagnosis Date   Abdominal wall mass    left side   Anemia    Anxiety    Depression    Elevated cholesterol    GERD (gastroesophageal reflux disease)    History of Helicobacter pylori infection    per pt 2016   Renal cyst, right    per CT 04-04-2016   Vaginal Pap smear, abnormal    Wears glasses    Past Surgical History:  Procedure Laterality Date   back  08/2021   CESAREAN SECTION  2010   COLONOSCOPY  last one 06-19-2015   EXCISION ABDOMINAL WALL MASS  2012   right side   EXCISION MASS ABDOMINAL N/A 05/21/2016   Procedure: EXCISION ABDOMINAL WALL MASS;  Surgeon: Rodman Pickle, MD;  Location: [facility] [place];  Service: General;  Laterality: N/A;   RESECTION OF ABDOMINAL MASS     Patient Active Problem List   Diagnosis Date Noted   Endometrioma 09/19/2022   Dysmenorrhea 02/01/2014   Tobacco dependency 02/01/2014   Obesity 12/09/2013   Essential hypertension, benign 12/09/2013   Candidiasis of vulva and vagina 12/08/2013   Bilateral nipple discharge 11/08/2013   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   Unspecified disorder of menstruation and other abnormal bleeding from female genital tract 11/08/2013   Irregular menstrual cycle 11/08/2013   UTI (lower urinary tract infection) 01/29/2013   HSV (herpes simplex virus) infection 01/26/2013   Well woman exam  with routine gynecological exam 01/26/2013   BMI 40.0-44.9, adult (HCC) 01/26/2013   Elevated BP 01/26/2013    REFERRING PROVIDER:  Jonetta Speak, NP   REFERRING DIAG:  Diagnosis  Z98.1 (ICD-10-CM) - Arthrodesis status  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 "I felt so much better after last time." Reports benefit from thoracic stretches added to HEP. Continues to ambulate with SPC.   PERTINENT HISTORY:  Anxiety, h/o c-section  PAIN:  Are you having pain? Yes: NPRS scale: 3/10 at worst/10 Pain location: pointing just lateral to Rt SIJ Pain description: tightness- rubbing from Rt SIJ to Rt hip Aggravating factors: long stride Relieving factors: pain meds, laying on left side.   PRECAUTIONS: None  WEIGHT BEARING RESTRICTIONS: No  FALLS:  Has patient fallen in last 6 months? Yes. Number of falls 2 6/17: fell about 2 weeks down an escalator 8/15: Had a fall down bleachers about 3 weeks ago at daughter's school  LIVING ENVIRONMENT: Lives with: lives with an adult companion Lives in: House/apartment Stairs: yes Has following equipment at home: Single point cane and Environmental consultant - 4 wheeled  OCCUPATION: not working  PLOF: Independent with basic ADLs  PATIENT GOALS: lift leg for more than a couple of seconds without shaking, regain strength, relieve sensation that back needs to pop   OBJECTIVE:   DIAGNOSTIC FINDINGS:  MRI  09/30/20: IMPRESSION: Status post L5-S1 decompression. A large right paracentral and subarticular recess disc protrusion impinges on the right S1 root and deforms the right side of the thecal sac. There is also moderate right foraminal narrowing at this level.  Mild central canal and bilateral foraminal narrowing L4-5.  Xray lumbar completed 04/22/22.  PATIENT SURVEYS:  EVAL: LEFS: 21/80 09/09/22 LEFS: 17/80  COGNITIVE STATUS: Within functional limits for tasks assessed   POSTURE:  Eval: bil genu valgus and pronated through feet 6/17: Lt foot pronation, no valgus notable  GAIT: No AD at eval with slow cadence, short step length Comments: discussed trekking poles vs [facility]  6/17: with SPC in left hand, step through bilaterally, no scissoring, leaning over cane resulting in left lumbar shift   Body Part #1 Lumbar   Lumbar ROM LUMBAR ROM:   Active  A/PROM  eval AROM 6/17  Flexion Knee joint line Proximal 1/3 shin  Extension    Right lateral flexion Joint line Joint line  Left lateral flexion Joint line Joint line  Right rotation    Left rotation     (Blank rows = not tested)       Body Part #2 Hip//knee   LE MMT LOWER EXTREMITY MMT:    MMT Right eval Left eval Rt//Lt 6/17  Hip flexion 19.7 18.8 22.8/28.2  Hip extension     Hip abduction 28.5 21.5 27.5/24.5  Hip adduction     Hip internal rotation     Hip external rotation     Knee flexion 38.7 24.9 14.2/19.8  Knee extension 31.3 28.1 23.6/23.3   (Blank rows = not tested)     FUNCTIONAL TESTS:  30s chair stand: 5.5 wihtout UEs SLS: bil 2-3 s with ability to control balance correction without UEs 09/09/22: 30s sit to stand: 7 without use of UEs SLS: Rt 2-3 s, Lt 6s with ability correct LOB independently  TREATMENT:  Treatment                           8/15 Cupping/STM to lumbar  paraspinals in prone position  LTR 5" 2x10 SKTC 30sec x2ea Thread the needle stretch in quadruped 5" x5ea Child's pose stretch 30sec x2 pre-cupping, x3 after LAQ 3# 5sec hold 2x10ea Partial squats 2x10   Treatment                           8/12 Cupping/STM to lumbar paraspinals in prone position  LTR 5" 2x10 Manual HSS 30sec x2 R SKTC 30sec x2ea Thread the needle stretch in quadruped 5" x5ea Standing rhomboid stretch at doorframe- instructed for HEP Child's pose stretch 10-15s x5    PATIENT EDUCATION:  Education details:  Anatomy of condition, POC, HEP, exercise form/rationale, pain neuroscience Person educated: Patient Education method: Explanation, Demonstration, Tactile cues, Verbal cues, Education comprehension: verbalized understanding, returned demonstration, verbal cues required, tactile cues required, and needs further education  HOME EXERCISE PROGRAM: [id number]   ASSESSMENT:  CLINICAL IMPRESSION: Good tolerance for stretching and strengthening program today. Continued with cupping due to good response last session. Pt to have re-evaluation next visit.    OBJECTIVE IMPAIRMENTS: Abnormal gait, decreased activity tolerance, decreased balance, difficulty walking, decreased strength, impaired perceived functional ability, increased muscle spasms, impaired flexibility, improper body mechanics, postural dysfunction, and pain.   ACTIVITY LIMITATIONS: carrying, lifting, bending, sitting, standing, squatting, stairs, transfers, bathing, dressing, locomotion level, and caring for others  PARTICIPATION LIMITATIONS: meal prep, cleaning, laundry, shopping, community activity, occupation, and yard work  PERSONAL FACTORS: Time since onset of injury/illness/exacerbation and anxiety, h/o c-section  are also affecting patient's functional outcome.   REHAB POTENTIAL: Good  CLINICAL DECISION MAKING: Evolving/moderate complexity  EVALUATION COMPLEXITY: Moderate   GOALS: Goals  reviewed with patient? Yes  SHORT TERM GOALS: Target date: 4/19  Pt will be compliant with daily HEP performance Baseline: began establishing at eval Goal status: IN PROGRESS  2.  Avoiding sustained seated positions greater than 45 min when able Baseline: reports pain with 1.5 hr of sitting and encouraged her to avoid greater than 45 min throughout the day Goal status: INITIAL   LONG TERM GOALS: Target date: POC date  LEFS to improve by MDC Baseline: 9 point MDC Goal status: INITIAL  2.  Able to walk through [facility] without feeling shaky Baseline: she feels like this is her most visited store as she orders groceries online Goal status: INITIAL  3.  Gross MMT to improve by 10% Baseline: see obj Goal status: INITIAL  4.  Pt will verbalize reduction in sensation of back "needing to pop" Baseline: constant at eval Goal status: INITIAL    PLAN:  PT FREQUENCY: 1x/week  PT DURATION: 12 weeks  PLANNED INTERVENTIONS: Therapeutic exercises, Therapeutic activity, Neuromuscular re-education, Balance training, Gait training, Patient/Family education, Self Care, Joint mobilization, Stair training, Aquatic Therapy, Dry Needling, Spinal mobilization, Cryotherapy, Moist heat, Taping, Ionotophoresis 4mg /ml Dexamethasone, Manual therapy, and Re-evaluation.  PLAN FOR NEXT SESSION: Continue dry needling as needed, right SI joint mobilizations   Riki Altes, PTA  11/07/22 12:10 PM    Check all possible CPT codes: 40981 - PT Re-evaluation, 97110- Therapeutic Exercise, [phone]- Neuro Re-education, [phone] - Gait Training, [phone] - Manual Therapy, 97530 - Therapeutic Activities, 97535 - Self Care, [phone] - Iontophoresis, and [id number] - Aquatic therapy    Check all conditions that are expected to impact  treatment: Musculoskeletal disorders   If treatment provided at initial evaluation, no treatment charged due to lack of authorization.

## 2022-11-12 ENCOUNTER — Ambulatory Visit (HOSPITAL_BASED_OUTPATIENT_CLINIC_OR_DEPARTMENT_OTHER): Payer: Medicaid Other | Admitting: Physical Therapy

## 2022-11-12 ENCOUNTER — Encounter (HOSPITAL_BASED_OUTPATIENT_CLINIC_OR_DEPARTMENT_OTHER): Payer: Self-pay | Admitting: Physical Therapy

## 2022-11-12 DIAGNOSIS — M6281 Muscle weakness (generalized): Secondary | ICD-10-CM

## 2022-11-12 DIAGNOSIS — M5459 Other low back pain: Secondary | ICD-10-CM

## 2022-11-12 DIAGNOSIS — R262 Difficulty in walking, not elsewhere classified: Secondary | ICD-10-CM

## 2022-11-12 NOTE — Therapy (Signed)
OUTPATIENT PHYSICAL THERAPY TREATMENT   Patient Name: Ellen Booker MRN: [id number] DOB:07/16/1980, 42 y.o., female Today's Date: 11/12/2022  END OF SESSION:  PT End of Session - 11/12/22 1104     Visit Number 19    Number of Visits 27    Date for PT Re-Evaluation 01/11/23    Authorization Type [place] [employer]    Authorization - Number of Visits 27    PT Start Time 1104    PT Stop Time 1142    PT Time Calculation (min) 38 min    Activity Tolerance Patient tolerated treatment well    Behavior During Therapy WFL for tasks assessed/performed                  Past Medical History:  Diagnosis Date   Abdominal wall mass    left side   Anemia    Anxiety    Depression    Elevated cholesterol    GERD (gastroesophageal reflux disease)    History of Helicobacter pylori infection    per pt 2016   Renal cyst, right    per CT 04-04-2016   Vaginal Pap smear, abnormal    Wears glasses    Past Surgical History:  Procedure Laterality Date   back  08/2021   CESAREAN SECTION  2010   COLONOSCOPY  last one 06-19-2015   EXCISION ABDOMINAL WALL MASS  2012   right side   EXCISION MASS ABDOMINAL N/A 05/21/2016   Procedure: EXCISION ABDOMINAL WALL MASS;  Surgeon: Rodman Pickle, MD;  Location: [facility] [place] [facility];  Service: General;  Laterality: N/A;   RESECTION OF ABDOMINAL MASS     Patient Active Problem List   Diagnosis Date Noted   Endometrioma 09/19/2022   Dysmenorrhea 02/01/2014   Tobacco dependency 02/01/2014   Obesity 12/09/2013   Essential hypertension, benign 12/09/2013   Candidiasis of vulva and vagina 12/08/2013   Bilateral nipple discharge 11/08/2013   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   Unspecified disorder of menstruation and other abnormal bleeding from female genital tract 11/08/2013   Irregular menstrual cycle 11/08/2013   UTI (lower urinary tract infection) 01/29/2013   HSV (herpes simplex virus) infection 01/26/2013   Well woman exam  with routine gynecological exam 01/26/2013   BMI 40.0-44.9, adult (HCC) 01/26/2013   Elevated BP 01/26/2013    REFERRING PROVIDER:  Jonetta Speak, NP   REFERRING DIAG:  Diagnosis  Z98.1 (ICD-10-CM) - Arthrodesis status  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 About 2 weeks ago. I was very nervous, my legs were shaking and had been walking a while.  Coming to therapy is loosening my back but the amount of time I can stand or walk is still the same.  My back tensed up again from the fall. I never cross my legs and for some reason I decided I was going to cross my legs and I have never felt a pain so sharp in my back. I cannot turn to the left completely.  Before the fall I was doing some of the exercises every day. I have made it a point to walk back and forth in my house and that has helped with me not being out of breath as much. Making it a point to go tot he storerather than ordering but I rush to try not to hit the 20 min mark- tends to be the fall point.   PERTINENT HISTORY:  Anxiety, h/o c-section  PAIN:  Are you having pain? Yes: NPRS scale: 3/10 at worst/10 Pain location: pointing just lateral to Rt SIJ Pain description: tightness- rubbing from Rt SIJ to Rt hip Aggravating factors: long stride Relieving factors: pain  meds, laying on left side.   PRECAUTIONS: None  WEIGHT BEARING RESTRICTIONS: No  FALLS:  Has patient fallen in last 6 months? Yes. Number of falls 2 6/17: fell about 2 weeks down an escalator 8/15: Had a fall down bleachers about 3 weeks ago at daughter's school  LIVING ENVIRONMENT: Lives with: lives with an adult companion Lives in: House/apartment Stairs: yes Has following equipment at home: Single point cane and Environmental consultant - 4 wheeled  OCCUPATION: not working  PLOF: Independent with basic ADLs  PATIENT GOALS: lift leg for more than a couple of seconds without shaking, regain strength, relieve sensation that back needs to pop   OBJECTIVE:   DIAGNOSTIC FINDINGS:  MRI 09/30/20: IMPRESSION: Status post L5-S1 decompression. A large right paracentral and subarticular recess disc protrusion impinges on the right S1 root and deforms the right side of the thecal sac. There is also moderate right foraminal narrowing at this level.  Mild central canal and bilateral foraminal narrowing L4-5.  Xray lumbar completed 04/22/22.  PATIENT SURVEYS:  EVAL: LEFS: 21/80 09/09/22 LEFS: 17/80 11/12/22 LEFS: 15/80  COGNITIVE STATUS: Within functional limits for tasks assessed   POSTURE:  Eval: bil genu valgus and pronated through feet 6/17: Lt foot pronation, no valgus notable  GAIT: No AD at eval with slow cadence, short step length Comments: discussed trekking poles vs [facility]  6/17: with SPC in left hand, step through bilaterally, no scissoring, leaning over cane resulting in left lumbar shift  8/20: SPC in left hand, minimal pressure, slow cadence, good foot clearance   Body Part #1 Lumbar   Lumbar ROM LUMBAR ROM:   Active  A/PROM  eval AROM 6/17  Flexion Knee joint line Proximal 1/3 shin  Extension    Right lateral flexion Joint line Joint line  Left lateral flexion Joint line Joint line  Right rotation    Left rotation     (Blank rows = not tested)       Body Part #2  Hip//knee   LE MMT LOWER EXTREMITY MMT:    MMT Right eval Left eval Rt//Lt 6/17 Rt/Lt 8/20  Hip flexion  19.7 18.8 22.8/28.2 37.6/28.7  Hip extension      Hip abduction 28.5 21.5 27.5/24.5 29.4bil/20.5  Hip adduction      Hip internal rotation      Hip external rotation      Knee flexion 38.7 24.9 14.2/19.8 21.1/20.4  Knee extension 31.3 28.1 23.6/23.3 30.5/27.5   (Blank rows = not tested)     FUNCTIONAL TESTS:  30s chair stand: 5.5 wihtout UEs SLS: bil 2-3 s with ability to control balance correction without UEs 09/09/22: 30s sit to stand: 7 without use of UEs SLS: Rt 2-3 s, Lt 6s with ability correct LOB independently 8/20: 30s sit<>stand: 7 without use of UEs, shaking in legs noted SLS: bil 2-3s with controlled weght shift and independent correction of LOB without UE use  TREATMENT:                                                                                                                               Treatment                            8/20:  ERO-see clinical impression statement   Treatment                           8/15 Cupping/STM to lumbar paraspinals in prone position  LTR 5" 2x10 SKTC 30sec x2ea Thread the needle stretch in quadruped 5" x5ea Child's pose stretch 30sec x2 pre-cupping, x3 after LAQ 3# 5sec hold 2x10ea Partial squats 2x10   Treatment                           8/12 Cupping/STM to lumbar paraspinals in prone position  LTR 5" 2x10 Manual HSS 30sec x2 R SKTC 30sec x2ea Thread the needle stretch in quadruped 5" x5ea Standing rhomboid stretch at doorframe- instructed for HEP Child's pose stretch 10-15s x5    PATIENT EDUCATION:  Education details:  Anatomy of condition, POC, HEP, exercise form/rationale, pain neuroscience Person educated: Patient Education method: Explanation, Demonstration, Tactile cues, Verbal cues, Education comprehension: verbalized understanding, returned demonstration, verbal cues required, tactile cues  required, and needs further education  HOME EXERCISE PROGRAM: [id number]   ASSESSMENT:  CLINICAL IMPRESSION: Patient has made some improvement in overall strength but has had minimal performance of home exercise program in the last 2 weeks after fall down bleachers.  At this point it is time to progress endurance tolerance to ambulation so she will continue with just 1 day a week and begin walking in the water for endurance challenge which I encouraged at least 4 to 5 days a week we discussed use of a small seat that she can carry around in order to decrease anxiety about weakness and fatigue resulting in a fall so that she is able to challenge her functional endurance in the community.  OBJECTIVE IMPAIRMENTS: Abnormal gait, decreased activity tolerance, decreased balance, difficulty walking, decreased strength, impaired perceived functional ability, increased muscle spasms, impaired flexibility, improper body mechanics, postural dysfunction, and pain.   ACTIVITY LIMITATIONS: carrying, lifting, bending, sitting, standing, squatting, stairs, transfers, bathing, dressing, locomotion level, and caring for others  PARTICIPATION LIMITATIONS: meal prep, cleaning, laundry, shopping, community activity, occupation, and yard work  PERSONAL FACTORS: Time since onset of injury/illness/exacerbation and anxiety, h/o c-section  are also affecting patient's functional outcome.   REHAB POTENTIAL: Good  CLINICAL DECISION MAKING: Evolving/moderate complexity  EVALUATION COMPLEXITY: Moderate   GOALS: Goals reviewed with patient? Yes  SHORT TERM GOALS: Target date: 4/19  Pt will be compliant with daily HEP performance Baseline: denies regular exercise since fall 2 weeks ago Goal status: ongoing  2.  Avoiding sustained seated positions greater than 45 min when able Baseline:  Goal status: achieved   LONG TERM GOALS: Target date: POC date  LEFS to improve by MDC Baseline: 9 point MDC Goal  status: ongoing  2.  Able to walk through [facility] without feeling shaky Baseline: no longer ordering online and going to store but tries to hurry so it doesn't take her more than 20 min Goal status: ongoing  3.  Gross MMT to improve by 10% Baseline: see obj Goal status: ongoing  4.  Pt will verbalize reduction in sensation of back "needing to pop" Baseline: unchanged Goal status:ongoing    PLAN:  PT FREQUENCY: 1x/week  PT DURATION: 12 weeks  PLANNED INTERVENTIONS: Therapeutic exercises, Therapeutic activity, Neuromuscular re-education, Balance training, Gait training, Patient/Family education, Self Care, Joint mobilization, Stair training, Aquatic Therapy, Dry Needling, Spinal mobilization, Cryotherapy, Moist heat, Taping, Ionotophoresis 4mg /ml Dexamethasone, Manual therapy, and Re-evaluation.  PLAN FOR NEXT SESSION: Continue dry needling as needed, right SI joint mobilizations, ambulation endurance, hip flexor stretching  Casidee Jann C. Electa Sterry PT, DPT 11/12/22 12:18 PM    Check all possible CPT codes: 16109 - PT Re-evaluation, 97110- Therapeutic Exercise, [phone]- Neuro Re-education, [phone] - Gait Training, [phone] - Manual Therapy, [phone] - Therapeutic Activities, [phone] - Self Care, [phone] - Iontophoresis, and [phone] - Aquatic therapy    Check all conditions that are expected to impact treatment: Musculoskeletal disorders   If treatment provided at initial evaluation, no treatment charged due to lack of authorization.

## 2022-11-14 ENCOUNTER — Encounter (HOSPITAL_BASED_OUTPATIENT_CLINIC_OR_DEPARTMENT_OTHER): Payer: Self-pay

## 2022-11-14 ENCOUNTER — Ambulatory Visit (HOSPITAL_BASED_OUTPATIENT_CLINIC_OR_DEPARTMENT_OTHER): Payer: Medicaid Other

## 2022-11-18 ENCOUNTER — Encounter: Payer: Self-pay | Admitting: Radiology

## 2022-11-19 ENCOUNTER — Ambulatory Visit (HOSPITAL_BASED_OUTPATIENT_CLINIC_OR_DEPARTMENT_OTHER): Payer: Medicaid Other | Admitting: Physical Therapy

## 2022-11-19 ENCOUNTER — Encounter (HOSPITAL_BASED_OUTPATIENT_CLINIC_OR_DEPARTMENT_OTHER): Payer: Self-pay

## 2022-11-19 NOTE — Therapy (Deleted)
OUTPATIENT PHYSICAL THERAPY TREATMENT   Patient Name: Ellen Booker MRN: [id number] DOB:[birth year], 42 y.o., female Today's Date: 11/19/2022  END OF SESSION:        Past Medical History:  Diagnosis Date   Abdominal wall mass    left side   Anemia    Anxiety    Depression    Elevated cholesterol    GERD (gastroesophageal reflux disease)    History of Helicobacter pylori infection    per pt 2016   Renal cyst, right    per CT 04-04-2016   Vaginal Pap smear, abnormal    Wears glasses    Past Surgical History:  Procedure Laterality Date   back  08/2021   CESAREAN SECTION  2010   COLONOSCOPY  last one 06-19-2015   EXCISION ABDOMINAL WALL MASS  2012   right side   EXCISION MASS ABDOMINAL N/A 05/21/2016   Procedure: EXCISION ABDOMINAL WALL MASS;  Surgeon: Rodman Pickle, MD;  Location: [facility] ;  Service: General;  Laterality: N/A;   RESECTION OF ABDOMINAL MASS     Patient Active Problem List   Diagnosis Date Noted   Endometrioma 09/19/2022   Dysmenorrhea 02/01/2014   Tobacco dependency 02/01/2014   Obesity 12/09/2013   Essential hypertension, benign 12/09/2013   Candidiasis of vulva and vagina 12/08/2013   Bilateral nipple discharge 11/08/2013   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   Unspecified disorder of menstruation and other abnormal bleeding from female genital tract 11/08/2013   Irregular menstrual cycle 11/08/2013   UTI (lower urinary tract infection) 01/29/2013   HSV (herpes simplex virus) infection 01/26/2013   Well woman exam with routine gynecological exam 01/26/2013   BMI 40.0-44.9, adult (HCC) 01/26/2013   Elevated BP 01/26/2013    REFERRING PROVIDER:  Jonetta Speak, NP   REFERRING DIAG:  Diagnosis  Z98.1 (ICD-10-CM) - Arthrodesis status  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  ONSET DATE: chronic   SUBJECTIVE:                                                                                                                                                                                           SUBJECTIVE STATEMENT:  Pt reports 4/10 pain level at entry. "I felt so much better after last time." Reports benefit from thoracic stretches added to HEP. Continues to ambulate with SPC.   PERTINENT HISTORY:  Anxiety, h/o c-section  PAIN:  Are you having pain? Yes: NPRS scale: 3/10 at worst/10  Pain location: pointing just lateral to Rt SIJ Pain description: tightness- rubbing from Rt SIJ to Rt hip Aggravating factors: long stride Relieving factors: pain meds, laying on left side.   PRECAUTIONS: None  WEIGHT BEARING RESTRICTIONS: No  FALLS:  Has patient fallen in last 6 months? Yes. Number of falls 2 6/17: fell about 2 weeks down an escalator 8/15: Had a fall down bleachers about 3 weeks ago at daughter's school  LIVING ENVIRONMENT: Lives with: lives with an adult companion Lives in: House/apartment Stairs: yes Has following equipment at home: Single point cane and Environmental consultant - 4 wheeled  OCCUPATION: not working  PLOF: Independent with basic ADLs  PATIENT GOALS: lift leg for more than a couple of seconds without shaking, regain strength, relieve sensation that back needs to pop   OBJECTIVE:   DIAGNOSTIC FINDINGS:  MRI 09/30/20: IMPRESSION: Status post L5-S1 decompression. A large right paracentral and subarticular recess disc protrusion impinges on the right S1 root and deforms the right side of the thecal sac. There is also moderate right foraminal narrowing at this level.  Mild central canal and bilateral foraminal narrowing L4-5.  Xray lumbar completed 04/22/22.  PATIENT SURVEYS:  EVAL: LEFS: 21/80 09/09/22 LEFS: 17/80  COGNITIVE STATUS: Within functional limits for tasks  assessed   POSTURE:  Eval: bil genu valgus and pronated through feet 6/17: Lt foot pronation, no valgus notable  GAIT: No AD at eval with slow cadence, short step length Comments: discussed trekking poles vs [facility]  6/17: with SPC in left hand, step through bilaterally, no scissoring, leaning over cane resulting in left lumbar shift   Body Part #1 Lumbar   Lumbar ROM LUMBAR ROM:   Active  A/PROM  eval AROM 6/17  Flexion Knee joint line Proximal 1/3 shin  Extension    Right lateral flexion Joint line Joint line  Left lateral flexion Joint line Joint line  Right rotation    Left rotation     (Blank rows = not tested)       Body Part #2 Hip//knee   LE MMT LOWER EXTREMITY MMT:    MMT Right eval Left eval Rt//Lt 6/17  Hip flexion 19.7 18.8 22.8/28.2  Hip extension     Hip abduction 28.5 21.5 27.5/24.5  Hip adduction     Hip internal rotation     Hip external rotation     Knee flexion 38.7 24.9 14.2/19.8  Knee extension 31.3 28.1 23.6/23.3   (Blank rows = not tested)     FUNCTIONAL TESTS:  30s chair stand: 5.5 wihtout UEs SLS: bil 2-3 s with ability to control balance correction without UEs 09/09/22: 30s sit to stand: 7 without use of UEs SLS: Rt 2-3 s, Lt 6s with ability correct LOB independently  TREATMENT:  Treatment                           8/15 Cupping/STM to lumbar paraspinals in prone position  LTR 5" 2x10 SKTC 30sec x2ea Thread the needle stretch in quadruped 5" x5ea Child's pose stretch 30sec x2 pre-cupping, x3 after LAQ 3# 5sec hold 2x10ea Partial squats 2x10   Treatment                           8/12 Cupping/STM to lumbar paraspinals in prone position  LTR 5" 2x10 Manual HSS 30sec x2 R SKTC 30sec x2ea Thread the needle stretch in quadruped 5" x5ea Standing rhomboid stretch at doorframe- instructed for  HEP Child's pose stretch 10-15s x5    PATIENT EDUCATION:  Education details:  Anatomy of condition, POC, HEP, exercise form/rationale, pain neuroscience Person educated: Patient Education method: Explanation, Demonstration, Tactile cues, Verbal cues, Education comprehension: verbalized understanding, returned demonstration, verbal cues required, tactile cues required, and needs further education  HOME EXERCISE PROGRAM: [id number]   ASSESSMENT:  CLINICAL IMPRESSION: Good tolerance for stretching and strengthening program today. Continued with cupping due to good response last session. Pt to have re-evaluation next visit.    OBJECTIVE IMPAIRMENTS: Abnormal gait, decreased activity tolerance, decreased balance, difficulty walking, decreased strength, impaired perceived functional ability, increased muscle spasms, impaired flexibility, improper body mechanics, postural dysfunction, and pain.   ACTIVITY LIMITATIONS: carrying, lifting, bending, sitting, standing, squatting, stairs, transfers, bathing, dressing, locomotion level, and caring for others  PARTICIPATION LIMITATIONS: meal prep, cleaning, laundry, shopping, community activity, occupation, and yard work  PERSONAL FACTORS: Time since onset of injury/illness/exacerbation and anxiety, h/o c-section  are also affecting patient's functional outcome.   REHAB POTENTIAL: Good  CLINICAL DECISION MAKING: Evolving/moderate complexity  EVALUATION COMPLEXITY: Moderate   GOALS: Goals reviewed with patient? Yes  SHORT TERM GOALS: Target date: 4/19  Pt will be compliant with daily HEP performance Baseline: began establishing at eval Goal status: IN PROGRESS  2.  Avoiding sustained seated positions greater than 45 min when able Baseline: reports pain with 1.5 hr of sitting and encouraged her to avoid greater than 45 min throughout the day Goal status: INITIAL   LONG TERM GOALS: Target date: POC date  LEFS to improve by  MDC Baseline: 9 point MDC Goal status: INITIAL  2.  Able to walk through [facility] without feeling shaky Baseline: she feels like this is her most visited store as she orders groceries online Goal status: INITIAL  3.  Gross MMT to improve by 10% Baseline: see obj Goal status: INITIAL  4.  Pt will verbalize reduction in sensation of back "needing to pop" Baseline: constant at eval Goal status: INITIAL    PLAN:  PT FREQUENCY: 1x/week  PT DURATION: 12 weeks  PLANNED INTERVENTIONS: Therapeutic exercises, Therapeutic activity, Neuromuscular re-education, Balance training, Gait training, Patient/Family education, Self Care, Joint mobilization, Stair training, Aquatic Therapy, Dry Needling, Spinal mobilization, Cryotherapy, Moist heat, Taping, Ionotophoresis 4mg /ml Dexamethasone, Manual therapy, and Re-evaluation.  PLAN FOR NEXT SESSION: Continue dry needling as needed, right SI joint mobilizations   Riki Altes, PTA  11/19/22 4:02 PM    Check all possible CPT codes: 32355 - PT Re-evaluation, 97110- Therapeutic Exercise, [phone]- Neuro Re-education, [phone] - Gait Training, [phone] - Manual Therapy, 97530 - Therapeutic Activities, 97535 - Self Care, [phone] - Iontophoresis, and [id number] - Aquatic therapy    Check all conditions that are expected to impact  treatment: Musculoskeletal disorders   If treatment provided at initial evaluation, no treatment charged due to lack of authorization.

## 2022-11-21 ENCOUNTER — Ambulatory Visit (HOSPITAL_BASED_OUTPATIENT_CLINIC_OR_DEPARTMENT_OTHER): Payer: Medicaid Other

## 2022-11-21 ENCOUNTER — Encounter (HOSPITAL_BASED_OUTPATIENT_CLINIC_OR_DEPARTMENT_OTHER): Payer: Self-pay

## 2022-11-21 DIAGNOSIS — R262 Difficulty in walking, not elsewhere classified: Secondary | ICD-10-CM

## 2022-11-21 DIAGNOSIS — M5459 Other low back pain: Secondary | ICD-10-CM

## 2022-11-21 DIAGNOSIS — M6281 Muscle weakness (generalized): Secondary | ICD-10-CM

## 2022-11-21 NOTE — Therapy (Signed)
OUTPATIENT PHYSICAL THERAPY TREATMENT   Patient Name: Ellen Booker MRN: [id number] DOB:Apr 08, 1980, 42 y.o., female Today's Date: 11/21/2022  END OF SESSION:  PT End of Session - 11/21/22 1108     Visit Number 20    Number of Visits 27    Date for PT Re-Evaluation 01/11/23    Authorization Type [place] [employer]    Authorization - Number of Visits 27    PT Start Time 1104    PT Stop Time 1144    PT Time Calculation (min) 40 min    Activity Tolerance Patient tolerated treatment well    Behavior During Therapy WFL for tasks assessed/performed                  Past Medical History:  Diagnosis Date   Abdominal wall mass    left side   Anemia    Anxiety    Depression    Elevated cholesterol    GERD (gastroesophageal reflux disease)    History of Helicobacter pylori infection    per pt 2016   Renal cyst, right    per CT 04-04-2016   Vaginal Pap smear, abnormal    Wears glasses    Past Surgical History:  Procedure Laterality Date   back  08/2021   CESAREAN SECTION  2010   COLONOSCOPY  last one 06-19-2015   EXCISION ABDOMINAL WALL MASS  2012   right side   EXCISION MASS ABDOMINAL N/A 05/21/2016   Procedure: EXCISION ABDOMINAL WALL MASS;  Surgeon: Rodman Pickle, MD;  Location: [facility];  Service: General;  Laterality: N/A;   RESECTION OF ABDOMINAL MASS     Patient Active Problem List   Diagnosis Date Noted   Endometrioma 09/19/2022   Dysmenorrhea 02/01/2014   Tobacco dependency 02/01/2014   Obesity 12/09/2013   Essential hypertension, benign 12/09/2013   Candidiasis of vulva and vagina 12/08/2013   Bilateral nipple discharge 11/08/2013   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   Unspecified disorder of menstruation and other abnormal bleeding from female genital tract 11/08/2013   Irregular menstrual cycle 11/08/2013   UTI (lower urinary tract infection) 01/29/2013   HSV (herpes simplex virus) infection 01/26/2013   Well woman exam  with routine gynecological exam 01/26/2013   BMI 40.0-44.9, adult (HCC) 01/26/2013   Elevated BP 01/26/2013    REFERRING PROVIDER:  Jonetta Speak, NP   REFERRING DIAG:  Diagnosis  Z98.1 (ICD-10-CM) - Arthrodesis status  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 "The chair slid back and I landed on my low back. It took me awhile to stand up. I used the railing to pull up." Pt reports increased pain since this. Arrives with 5/10 low back pain today.   PERTINENT HISTORY:  Anxiety, h/o c-section  PAIN:  Are you having pain? Yes: NPRS scale: 5/10 at worst/10 Pain location: pointing just lateral to Rt SIJ Pain description: tightness- rubbing from Rt SIJ to Rt hip Aggravating factors: long stride Relieving factors: pain meds, laying on left side.   PRECAUTIONS: None  WEIGHT BEARING RESTRICTIONS: No  FALLS:  Has patient fallen in last 6 months? Yes. Number of falls 2 6/17: fell about 2 weeks down an escalator 8/15: Had a fall down bleachers about 3 weeks ago at daughter's school  LIVING ENVIRONMENT: Lives with: lives with an adult companion Lives in: House/apartment Stairs: yes Has following equipment at home: Single point cane and Environmental consultant - 4 wheeled  OCCUPATION: not working  PLOF: Independent with basic ADLs  PATIENT GOALS: lift leg for more than a couple of seconds without shaking, regain strength,  relieve sensation that back needs to pop   OBJECTIVE:   DIAGNOSTIC FINDINGS:  MRI 09/30/20: IMPRESSION: Status post L5-S1 decompression. A large right paracentral and subarticular recess disc protrusion impinges on the right S1 root and deforms the right side of the thecal sac. There is also moderate right foraminal narrowing at this level.  Mild central canal and bilateral foraminal narrowing L4-5.  Xray lumbar completed 04/22/22.  PATIENT SURVEYS:  EVAL: LEFS: 21/80 09/09/22 LEFS: 17/80 11/12/22 LEFS: 15/80  COGNITIVE STATUS: Within functional limits for tasks assessed   POSTURE:  Eval: bil genu valgus and pronated through feet 6/17: Lt foot pronation, no valgus notable  GAIT: No AD at eval with slow cadence, short step length Comments: discussed trekking poles vs [facility]  6/17: with SPC in left hand, step through bilaterally, no scissoring, leaning over cane resulting in left lumbar shift  8/20: SPC in left hand, minimal pressure, slow cadence, good foot clearance   Body Part #1 Lumbar   Lumbar ROM LUMBAR ROM:   Active  A/PROM  eval AROM 6/17  Flexion Knee joint line Proximal 1/3 shin  Extension    Right lateral flexion Joint line Joint line  Left lateral flexion Joint line Joint line  Right rotation    Left rotation     (Blank rows = not tested)       Body Part #2 Hip//knee   LE MMT LOWER EXTREMITY MMT:    MMT Right eval Left eval Rt//Lt 6/17 Rt/Lt 8/20  Hip flexion 19.7 18.8 22.8/28.2 37.6/28.7  Hip extension      Hip abduction 28.5 21.5 27.5/24.5 29.4bil/20.5  Hip adduction      Hip internal rotation      Hip external rotation      Knee flexion 38.7 24.9 14.2/19.8 21.1/20.4  Knee extension 31.3 28.1 23.6/23.3 30.5/27.5   (Blank rows = not tested)     FUNCTIONAL TESTS:  30s chair stand: 5.5 wihtout UEs SLS: bil 2-3 s with ability to control balance correction without UEs 09/09/22: 30s sit to stand: 7 without use of UEs SLS: Rt 2-3 s, Lt 6s  with ability correct LOB independently 8/20: 30s sit<>stand: 7 without use of UEs, shaking in legs noted SLS: bil 2-3s with controlled weght shift and independent correction of LOB without UE use  TREATMENT:                                                                                                                                Treatment                           8/29 Cupping/STM to lumbar paraspinals in prone position  LTR 5" 2x10 SKTC 20sec x2ea Supine SLR x8ea with cues for TrA LAQ 3# 5sec hold x15ea Partial squats x15   Treatment                            8/20:  ERO-see clinical impression statement   Treatment                           8/15 Cupping/STM to lumbar paraspinals in prone position  LTR 5" 2x10 SKTC 30sec x2ea Thread the needle stretch in quadruped 5" x5ea Child's pose stretch 30sec x2 pre-cupping, x3 after LAQ 3# 5sec hold 2x10ea Partial squats 2x10   Treatment                           8/12 Cupping/STM to lumbar paraspinals in prone position  LTR 5" 2x10 Manual HSS 30sec x2 R SKTC 30sec x2ea Thread the needle stretch in quadruped 5" x5ea Standing rhomboid stretch at doorframe- instructed for HEP Child's pose stretch 10-15s x5    PATIENT EDUCATION:  Education details:  Anatomy of condition, POC, HEP, exercise form/rationale, pain neuroscience Person educated: Patient Education method: Explanation, Demonstration, Tactile cues, Verbal cues, Education comprehension: verbalized understanding, returned demonstration, verbal cues required, tactile cues required, and needs further education  HOME EXERCISE PROGRAM: [id number]   ASSESSMENT:  CLINICAL IMPRESSION: Discussed with pt safe walking program as she is worried about falling while out walking. Educated her on importance of LE strengthening with HEP. She remains significantly challenged by LE strengthening exercises in clinic. Difficulty with LAQ on L LE citing "pulling: in low back. Will  continue to focus on LE strengthening to decrease fall risk.    OBJECTIVE IMPAIRMENTS: Abnormal gait, decreased activity tolerance, decreased balance, difficulty walking, decreased strength, impaired perceived functional ability, increased muscle spasms, impaired flexibility, improper body mechanics, postural dysfunction, and pain.   ACTIVITY LIMITATIONS: carrying, lifting, bending, sitting, standing, squatting, stairs, transfers, bathing, dressing, locomotion level, and caring for others  PARTICIPATION LIMITATIONS: meal prep, cleaning, laundry, shopping, community activity, occupation, and yard work  PERSONAL FACTORS: Time since onset of injury/illness/exacerbation and anxiety, h/o c-section  are also affecting patient's functional outcome.   REHAB POTENTIAL: Good  CLINICAL DECISION MAKING: Evolving/moderate complexity  EVALUATION COMPLEXITY: Moderate   GOALS: Goals reviewed with patient? Yes  SHORT TERM GOALS: Target date: 4/19  Pt will be compliant with daily HEP performance Baseline: denies regular exercise  since fall 2 weeks ago Goal status: ongoing  2.  Avoiding sustained seated positions greater than 45 min when able Baseline:  Goal status: achieved   LONG TERM GOALS: Target date: POC date  LEFS to improve by MDC Baseline: 9 point MDC Goal status: ongoing  2.  Able to walk through [facility] without feeling shaky Baseline: no longer ordering online and going to store but tries to hurry so it doesn't take her more than 20 min Goal status: ongoing  3.  Gross MMT to improve by 10% Baseline: see obj Goal status: ongoing  4.  Pt will verbalize reduction in sensation of back "needing to pop" Baseline: unchanged Goal status:ongoing    PLAN:  PT FREQUENCY: 1x/week  PT DURATION: 12 weeks  PLANNED INTERVENTIONS: Therapeutic exercises, Therapeutic activity, Neuromuscular re-education, Balance training, Gait training, Patient/Family education, Self Care, Joint  mobilization, Stair training, Aquatic Therapy, Dry Needling, Spinal mobilization, Cryotherapy, Moist heat, Taping, Ionotophoresis 4mg /ml Dexamethasone, Manual therapy, and Re-evaluation.  PLAN FOR NEXT SESSION: Continue dry needling as needed, right SI joint mobilizations, ambulation endurance, hip flexor stretching  Riki Altes, PTA  11/21/22 11:46 AM    Check all possible CPT codes: 42706 - PT Re-evaluation, 97110- Therapeutic Exercise, [phone]- Neuro Re-education, [phone] - Gait Training, [phone] - Manual Therapy, [phone] - Therapeutic Activities, [phone] - Self Care, [phone] - Iontophoresis, and [phone] - Aquatic therapy    Check all conditions that are expected to impact treatment: Musculoskeletal disorders   If treatment provided at initial evaluation, no treatment charged due to lack of authorization.

## 2022-11-22 ENCOUNTER — Ambulatory Visit (HOSPITAL_COMMUNITY)
Admission: RE | Admit: 2022-11-22 | Discharge: 2022-11-22 | Disposition: A | Discharge status: Home / Self Care | Payer: Medicaid Other | Source: Ambulatory Visit | Attending: Gastroenterology | Admitting: Gastroenterology

## 2022-11-22 DIAGNOSIS — K30 Functional dyspepsia: Secondary | ICD-10-CM | POA: Insufficient documentation

## 2022-11-22 DIAGNOSIS — R109 Unspecified abdominal pain: Secondary | ICD-10-CM | POA: Diagnosis present

## 2022-11-22 DIAGNOSIS — R1111 Vomiting without nausea: Secondary | ICD-10-CM | POA: Insufficient documentation

## 2022-11-22 MED ORDER — TECHNETIUM TC 99M SULFUR COLLOID
1.9800 | Freq: Once | INTRAVENOUS | Status: AC
  Administered 2022-11-22: 1.98 via ORAL

## 2022-11-28 ENCOUNTER — Other Ambulatory Visit (HOSPITAL_BASED_OUTPATIENT_CLINIC_OR_DEPARTMENT_OTHER): Payer: Self-pay

## 2022-11-28 ENCOUNTER — Encounter (HOSPITAL_BASED_OUTPATIENT_CLINIC_OR_DEPARTMENT_OTHER): Payer: Medicaid Other

## 2022-11-28 ENCOUNTER — Encounter (HOSPITAL_BASED_OUTPATIENT_CLINIC_OR_DEPARTMENT_OTHER): Payer: Self-pay

## 2022-11-28 ENCOUNTER — Ambulatory Visit (HOSPITAL_BASED_OUTPATIENT_CLINIC_OR_DEPARTMENT_OTHER): Payer: Medicaid Other | Attending: Orthopedic Surgery

## 2022-11-28 DIAGNOSIS — R262 Difficulty in walking, not elsewhere classified: Secondary | ICD-10-CM

## 2022-11-28 DIAGNOSIS — M5459 Other low back pain: Secondary | ICD-10-CM | POA: Diagnosis present

## 2022-11-28 DIAGNOSIS — M6281 Muscle weakness (generalized): Secondary | ICD-10-CM

## 2022-11-28 MED ORDER — SEMAGLUTIDE-WEIGHT MANAGEMENT 0.25 MG/0.5ML ~~LOC~~ SOAJ
0.2500 mg | SUBCUTANEOUS | 0 refills | Status: AC
  Filled 2022-11-28: qty 2, 28d supply, fill #0

## 2022-11-28 NOTE — Therapy (Signed)
OUTPATIENT PHYSICAL THERAPY TREATMENT   Patient Name: Ellen Booker MRN: [id number] DOB:1980-10-07, 42 y.o., female Today's Date: 11/28/2022  END OF SESSION:  PT End of Session - 11/28/22 0932     Visit Number 21    Number of Visits 27    Date for PT Re-Evaluation 01/11/23    Authorization Type [place] [employer]    Authorization - Number of Visits 27    PT Start Time 0933    PT Stop Time 1015    PT Time Calculation (min) 42 min    Activity Tolerance Patient tolerated treatment well    Behavior During Therapy WFL for tasks assessed/performed                   Past Medical History:  Diagnosis Date   Abdominal wall mass    left side   Anemia    Anxiety    Depression    Elevated cholesterol    GERD (gastroesophageal reflux disease)    History of Helicobacter pylori infection    per pt 2016   Renal cyst, right    per CT 04-04-2016   Vaginal Pap smear, abnormal    Wears glasses    Past Surgical History:  Procedure Laterality Date   back  08/2021   CESAREAN SECTION  2010   COLONOSCOPY  last one 06-19-2015   EXCISION ABDOMINAL WALL MASS  2012   right side   EXCISION MASS ABDOMINAL N/A 05/21/2016   Procedure: EXCISION ABDOMINAL WALL MASS;  Surgeon: Rodman Pickle, MD;  Location: [facility];  Service: General;  Laterality: N/A;   RESECTION OF ABDOMINAL MASS     Patient Active Problem List   Diagnosis Date Noted   Endometrioma 09/19/2022   Dysmenorrhea 02/01/2014   Tobacco dependency 02/01/2014   Obesity 12/09/2013   Essential hypertension, benign 12/09/2013   Candidiasis of vulva and vagina 12/08/2013   Bilateral nipple discharge 11/08/2013   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   Unspecified disorder of menstruation and other abnormal bleeding from female genital tract 11/08/2013   Irregular menstrual cycle 11/08/2013   UTI (lower urinary tract infection) 01/29/2013   HSV (herpes simplex virus) infection 01/26/2013   Well woman  exam with routine gynecological exam 01/26/2013   BMI 40.0-44.9, adult (HCC) 01/26/2013   Elevated BP 01/26/2013    REFERRING PROVIDER:  Jonetta Speak, NP   REFERRING DIAG:  Diagnosis  Z98.1 (ICD-10-CM) - Arthrodesis status  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 Has been using tennis ball at home. She continues to have pain when laying fully flat on back. Able to perform cat/cow stretch at home with benefit reported. Has been walking short distances each day and plans to build up each week.   PERTINENT HISTORY:  Anxiety, h/o c-section  PAIN:  Are you having pain? Yes: NPRS scale: 3/10 at worst/10 Pain location: pointing just lateral to Rt SIJ Pain description: tightness- rubbing from Rt SIJ to Rt hip Aggravating factors: long stride Relieving factors: pain meds, laying on left side.   PRECAUTIONS: None  WEIGHT BEARING RESTRICTIONS: No  FALLS:  Has patient fallen in last 6 months? Yes. Number of falls 2 6/17: fell about 2 weeks down an escalator 8/15: Had a fall down bleachers about 3 weeks ago at daughter's school  LIVING ENVIRONMENT: Lives with: lives with an adult companion Lives in: House/apartment Stairs: yes Has following equipment at home: Single point cane and Environmental consultant - 4 wheeled  OCCUPATION: not working  PLOF: Independent with basic ADLs  PATIENT GOALS: lift leg for more than a couple of  seconds without shaking, regain strength, relieve sensation that back needs to pop   OBJECTIVE:   DIAGNOSTIC FINDINGS:  MRI 09/30/20: IMPRESSION: Status post L5-S1 decompression. A large right paracentral and subarticular recess disc protrusion impinges on the right S1 root and deforms the right side of the thecal sac. There is also moderate right foraminal narrowing at this level.  Mild central canal and bilateral foraminal narrowing L4-5.  Xray lumbar completed 04/22/22.  PATIENT SURVEYS:  EVAL: LEFS: 21/80 09/09/22 LEFS: 17/80 11/12/22 LEFS: 15/80  COGNITIVE STATUS: Within functional limits for tasks assessed   POSTURE:  Eval: bil genu valgus and pronated through feet 6/17: Lt foot pronation, no valgus notable  GAIT: No AD at eval with slow cadence, short step length Comments: discussed trekking poles vs [facility]  6/17: with SPC in left hand, step through bilaterally, no scissoring, leaning over cane resulting in left lumbar shift  8/20: SPC in left hand, minimal pressure, slow cadence, good foot clearance   Body Part #1 Lumbar   Lumbar ROM LUMBAR ROM:   Active  A/PROM  eval AROM 6/17  Flexion Knee joint line Proximal 1/3 shin  Extension    Right lateral flexion Joint line Joint line  Left lateral flexion Joint line Joint line  Right rotation    Left rotation     (Blank rows = not tested)       Body Part #2 Hip//knee   LE MMT LOWER EXTREMITY MMT:    MMT Right eval Left eval Rt//Lt 6/17 Rt/Lt 8/20  Hip flexion 19.7 18.8 22.8/28.2 37.6/28.7  Hip extension      Hip abduction 28.5 21.5 27.5/24.5 29.4bil/20.5  Hip adduction      Hip internal rotation      Hip external rotation      Knee flexion 38.7 24.9 14.2/19.8 21.1/20.4  Knee extension 31.3 28.1 23.6/23.3 30.5/27.5   (Blank rows = not tested)     FUNCTIONAL TESTS:  30s chair stand: 5.5 wihtout UEs SLS: bil 2-3 s with ability to control balance correction without UEs 09/09/22: 30s sit to stand:  7 without use of UEs SLS: Rt 2-3 s, Lt 6s with ability correct LOB independently 8/20: 30s sit<>stand: 7 without use of UEs, shaking in legs noted SLS: bil 2-3s with controlled weght shift and independent correction of LOB without UE  use  TREATMENT:                                                                                                                               Treatment                           9/5  LTR 5" 2x10 Supine SLR 2x10ea with cues for TrA Sidelying abduction 2x10ea LAQ 3# 5sec hold 2x10 Sit to stands x10 Posterior slide lunges at rail x10ea HEP update and review   Treatment                           8/29 Cupping/STM to lumbar paraspinals in prone position  LTR 5" 2x10 SKTC 20sec x2ea Supine SLR x8ea with cues for TrA LAQ 3# 5sec hold x15ea Partial squats x15   Treatment                            8/20:  ERO-see clinical impression statement   Treatment                           8/15 Cupping/STM to lumbar paraspinals in prone position  LTR 5" 2x10 SKTC 30sec x2ea Thread the needle stretch in quadruped 5" x5ea Child's pose stretch 30sec x2 pre-cupping, x3 after LAQ 3# 5sec hold 2x10ea Partial squats 2x10   Treatment                           8/12 Cupping/STM to lumbar paraspinals in prone position  LTR 5" 2x10 Manual HSS 30sec x2 R SKTC 30sec x2ea Thread the needle stretch in quadruped 5" x5ea Standing rhomboid stretch at doorframe- instructed for HEP Child's pose stretch 10-15s x5    PATIENT EDUCATION:  Education details:  Anatomy of condition, POC, HEP, exercise form/rationale, pain neuroscience Person educated: Patient Education method: Explanation, Demonstration, Tactile cues, Verbal cues, Education comprehension: verbalized understanding, returned demonstration, verbal cues required, tactile cues required, and needs further education  HOME EXERCISE PROGRAM: [id number]   ASSESSMENT:  CLINICAL IMPRESSION: Pt able to progress  with LE strengthening today with good tolerance. She is most challenged with sit to stand and posterior slide lunges, requiring increased time for exercise completion. Able to perform increased repetitions of supine SLR and sidelying abd bilaterally. Updated HEP and reviewed with pt for proper understanding of exercises and frequency of perofrmance. S   OBJECTIVE IMPAIRMENTS: Abnormal gait, decreased activity tolerance, decreased balance, difficulty walking, decreased strength, impaired perceived functional ability, increased muscle spasms, impaired flexibility, improper body mechanics, postural dysfunction, and pain.   ACTIVITY LIMITATIONS: carrying, lifting, bending, sitting, standing, squatting, stairs, transfers, bathing, dressing, locomotion level, and caring for others  PARTICIPATION LIMITATIONS: meal prep, cleaning, laundry, shopping, community activity, occupation, and  yard work  PERSONAL FACTORS: Time since onset of injury/illness/exacerbation and anxiety, h/o c-section  are also affecting patient's functional outcome.   REHAB POTENTIAL: Good  CLINICAL DECISION MAKING: Evolving/moderate complexity  EVALUATION COMPLEXITY: Moderate   GOALS: Goals reviewed with patient? Yes  SHORT TERM GOALS: Target date: 4/19  Pt will be compliant with daily HEP performance Baseline: denies regular exercise since fall 2 weeks ago Goal status: ongoing  2.  Avoiding sustained seated positions greater than 45 min when able Baseline:  Goal status: achieved   LONG TERM GOALS: Target date: POC date  LEFS to improve by MDC Baseline: 9 point MDC Goal status: ongoing  2.  Able to walk through [facility] without feeling shaky Baseline: no longer ordering online and going to store but tries to hurry so it doesn't take her more than 20 min Goal status: ongoing  3.  Gross MMT to improve by 10% Baseline: see obj Goal status: ongoing  4.  Pt will verbalize reduction in sensation of back "needing  to pop" Baseline: unchanged Goal status:ongoing    PLAN:  PT FREQUENCY: 1x/week  PT DURATION: 12 weeks  PLANNED INTERVENTIONS: Therapeutic exercises, Therapeutic activity, Neuromuscular re-education, Balance training, Gait training, Patient/Family education, Self Care, Joint mobilization, Stair training, Aquatic Therapy, Dry Needling, Spinal mobilization, Cryotherapy, Moist heat, Taping, Ionotophoresis 4mg /ml Dexamethasone, Manual therapy, and Re-evaluation.  PLAN FOR NEXT SESSION: Continue dry needling as needed, right SI joint mobilizations, ambulation endurance, hip flexor stretching  Riki Altes, PTA  11/28/22 10:27 AM    Check all possible CPT codes: 72536 - PT Re-evaluation, 97110- Therapeutic Exercise, [phone]- Neuro Re-education, [phone] - Gait Training, [phone] - Manual Therapy, [phone] - Therapeutic Activities, [phone] - Self Care, [phone] - Iontophoresis, and [phone] - Aquatic therapy    Check all conditions that are expected to impact treatment: Musculoskeletal disorders   If treatment provided at initial evaluation, no treatment charged due to lack of authorization.

## 2022-12-02 ENCOUNTER — Encounter: Payer: Self-pay | Admitting: Gastroenterology

## 2022-12-05 ENCOUNTER — Encounter: Payer: Self-pay | Admitting: Gastroenterology

## 2022-12-09 NOTE — Telephone Encounter (Signed)
Metoclopramide 5 mg tablets  one tablet twice daily, 30 to 60 minutes before breakfast and supper  Dispense 60 tablets, 1 refill  She needs clinic follow-up with me.  Please offer her a telemedicine slot in my morning block on 12/24/2022.  HD

## 2022-12-10 MED ORDER — METOCLOPRAMIDE HCL 5 MG PO TABS: ORAL_TABLET | ORAL | 1 refills | Status: DC

## 2022-12-18 ENCOUNTER — Other Ambulatory Visit (HOSPITAL_BASED_OUTPATIENT_CLINIC_OR_DEPARTMENT_OTHER): Payer: Self-pay

## 2022-12-18 MED ORDER — WEGOVY 0.5 MG/0.5ML ~~LOC~~ SOAJ
0.5000 mg | SUBCUTANEOUS | 0 refills | Status: DC
  Filled 2022-12-18 (×2): qty 2, 28d supply, fill #0

## 2022-12-19 ENCOUNTER — Other Ambulatory Visit (HOSPITAL_BASED_OUTPATIENT_CLINIC_OR_DEPARTMENT_OTHER): Payer: Self-pay

## 2022-12-19 IMAGING — MR MR LUMBAR SPINE W/O CM
5 series · 31 of 48 positions shown · non-contrast
Comparison: None.

CLINICAL DATA: Low back pain radiating into the right leg to the
mid thigh for 5 days. The patient underwent lumbar surgery in
April 2020.

EXAM:
MRI LUMBAR SPINE WITHOUT CONTRAST
TECHNIQUE: Multiplanar, multisequence MR imaging of the lumbar spine was
performed. No intravenous contrast was administered.

[Series 5: T1 · sagittal · 4.0mm · 0.81mm/px · 7 of 17 slices shown (1 of 2)]
[im 1/17]
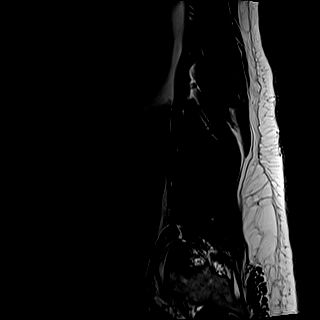
[im 3/17]
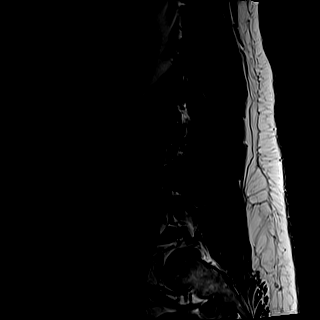
[im 6/17]
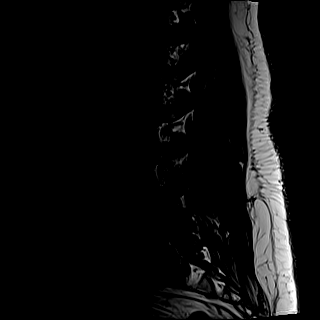
[im 9/17]
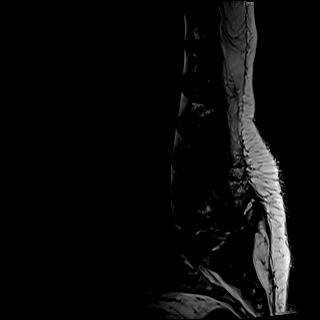
[im 11/17]
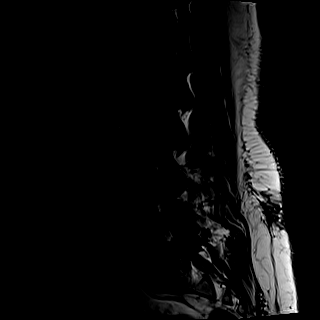
[im 14/17]
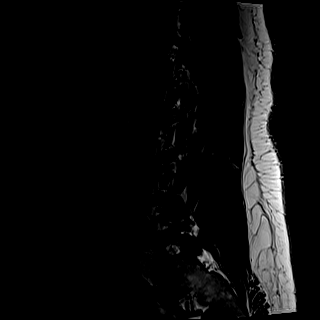
[im 17/17]
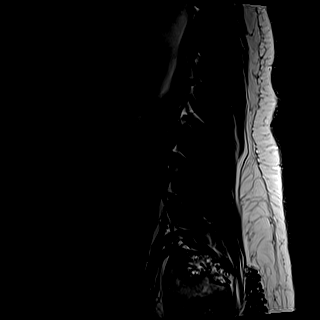

[Series 6: T2 · sagittal · 4.0mm · 0.81mm/px · 7 of 17 slices shown (1 of 2)]
[im 1/17]
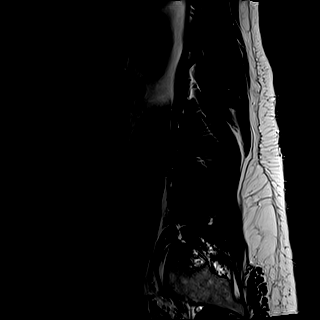
[im 3/17]
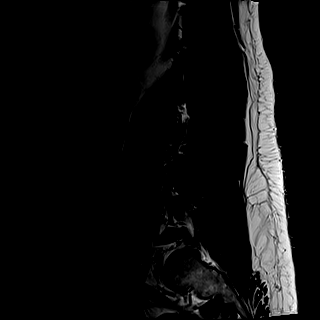
[im 6/17]
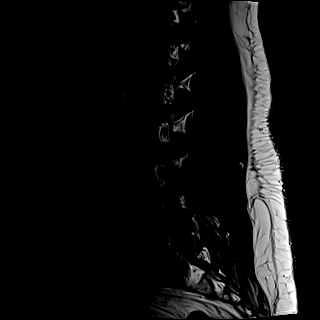
[im 9/17]
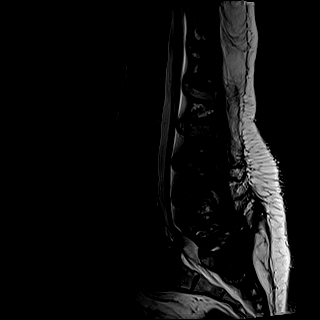
[im 11/17]
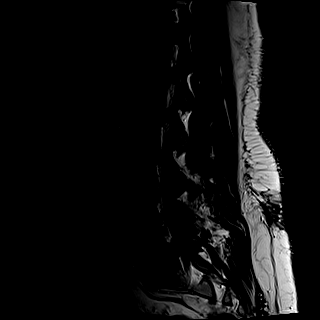
[im 14/17]
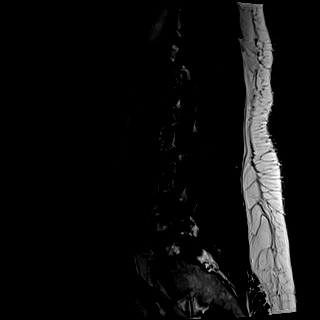
[im 17/17]
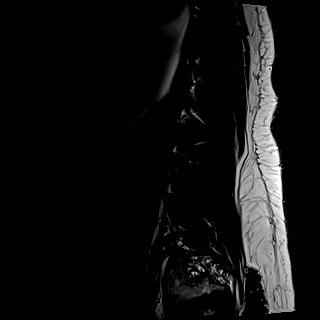

[Series 7: STIR · sagittal · 4.0mm · 0.51mm/px · 1 of 17 slices shown]
[im 1/17]
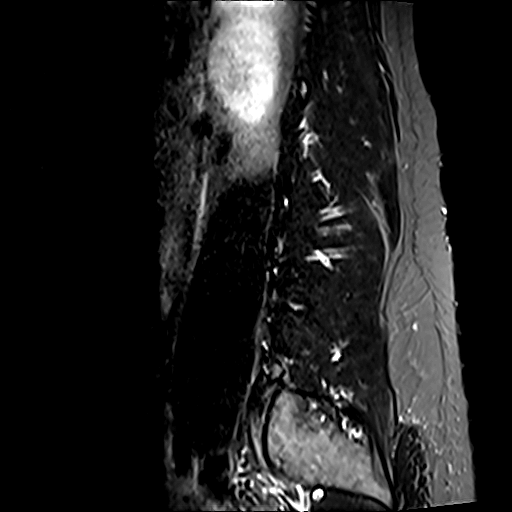

[Series 8: T2 · axial · 4.0mm · 0.69mm/px · z∈[-72,+149]mm · 8 of 38 slices shown (2 of 2)]
[im 1/38]
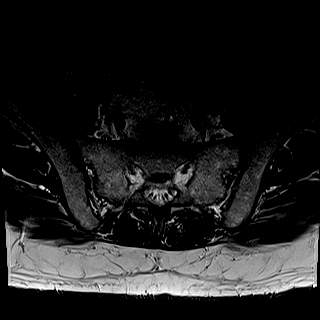
[im 6/38]
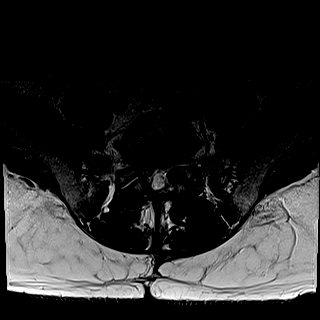
[im 12/38]
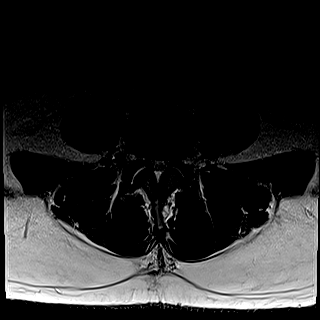
[im 18/38]
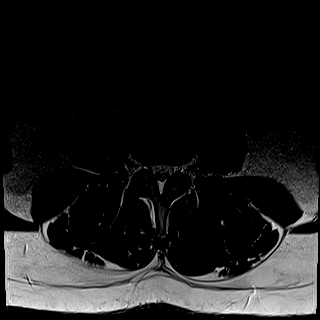
[im 20/38]
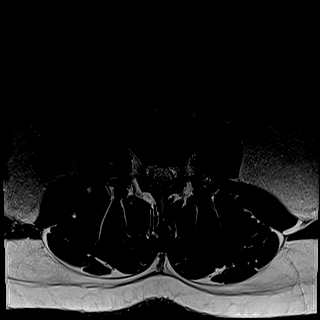
[im 26/38]
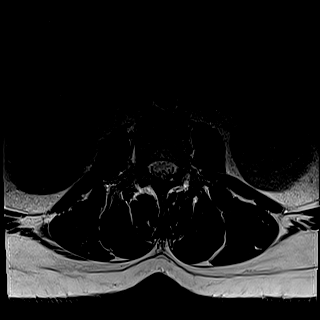
[im 32/38]
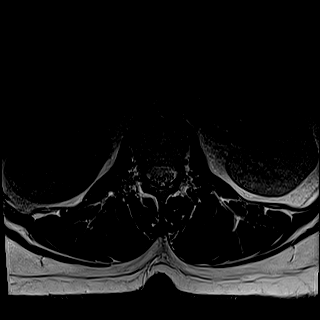
[im 38/38]
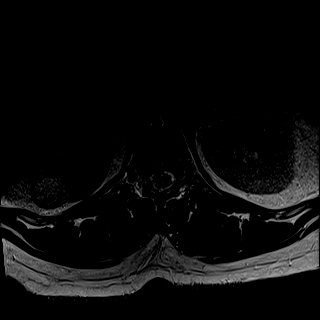

[Series 9: T1 · axial · 4.0mm · 0.43mm/px · z∈[-72,+149]mm · 8 of 38 slices shown (2 of 2)]
[im 1/38]
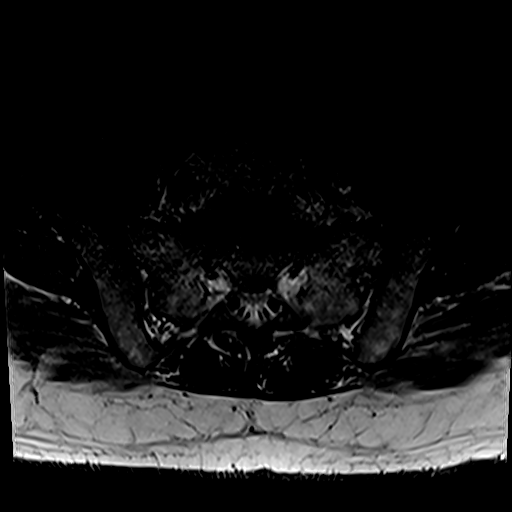
[im 6/38]
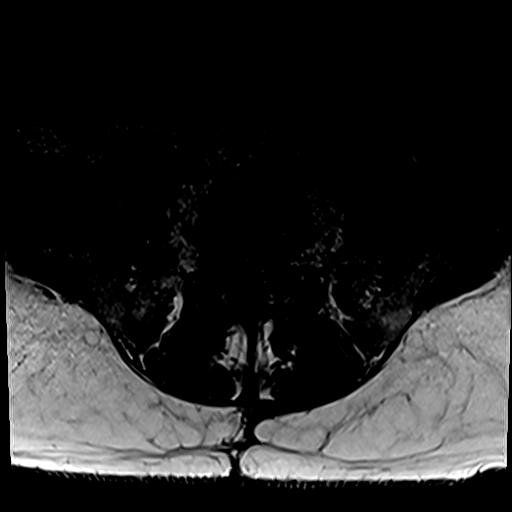
[im 12/38]
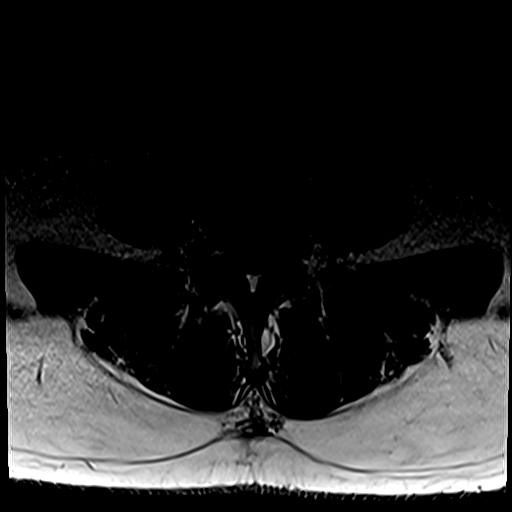
[im 18/38]
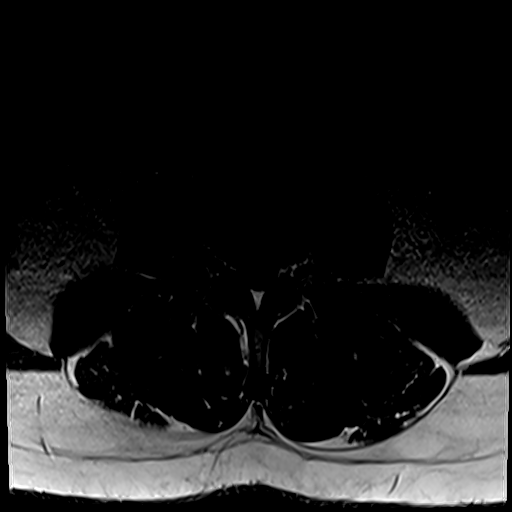
[im 20/38]
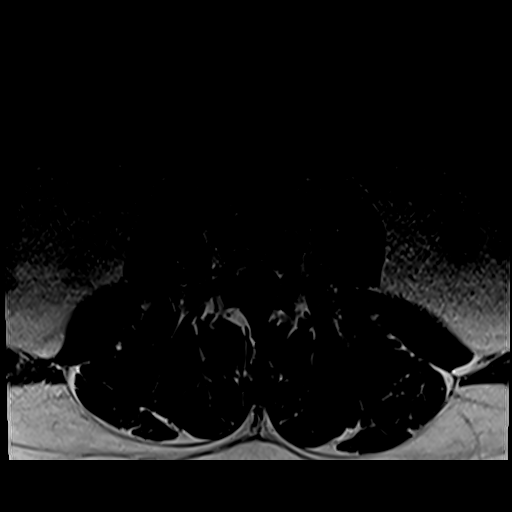
[im 26/38]
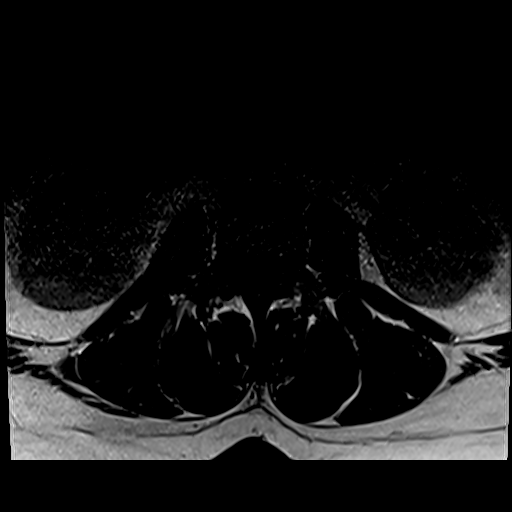
[im 32/38]
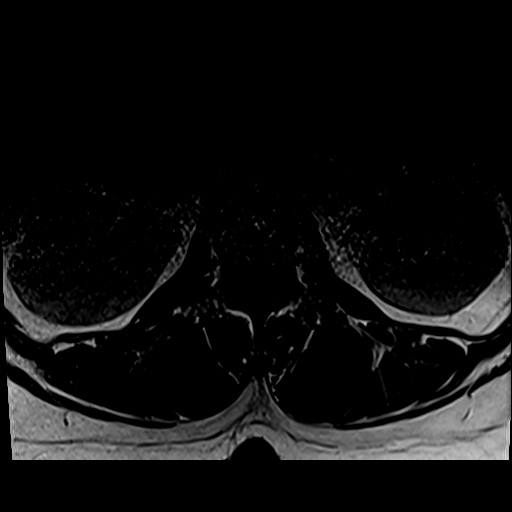
[im 38/38]
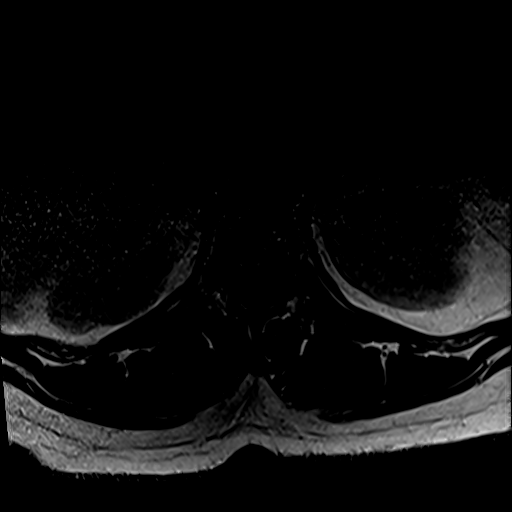

[31 of 48 positions shown; findings below may reference images not displayed]

FINDINGS: Segmentation:  Standard.

Alignment:  Normal.

Vertebrae: No fracture, evidence of discitis, or bone lesion.
Degenerative endplate signal change is seen at L5-S1 eccentric to
the right.

Conus medullaris and cauda equina: Conus extends to the L1 level.
Conus and cauda equina appear normal.

Paraspinal and other soft tissues: Right renal cyst noted.

Disc levels:

The T11-12 to L3-4 levels are negative.

L4-5: There is disc desiccation and a shallow broad-based bulge.
Mild facet degenerative disease and ligamentum flavum thickening are
seen. Mild central canal and bilateral foraminal narrowing.

L5-S1: Status post posterior decompression. There is moderate facet
arthropathy. A large right paracentral and subarticular recess disc
protrusion deforms the right aspect of the thecal sac and impinges
on the right S1 root. Mild to moderate foraminal narrowing is worse
on the right.
IMPRESSION: Status post L5-S1 decompression. A large right paracentral and
subarticular recess disc protrusion impinges on the right S1 root
and deforms the right side of the thecal sac. There is also moderate
right foraminal narrowing at this level.

Mild central canal and bilateral foraminal narrowing L4-5.

## 2022-12-20 ENCOUNTER — Encounter (HOSPITAL_BASED_OUTPATIENT_CLINIC_OR_DEPARTMENT_OTHER): Payer: Medicaid Other

## 2022-12-24 ENCOUNTER — Ambulatory Visit (HOSPITAL_BASED_OUTPATIENT_CLINIC_OR_DEPARTMENT_OTHER): Payer: Medicaid Other

## 2022-12-24 ENCOUNTER — Telehealth: Payer: Medicaid Other | Admitting: Gastroenterology

## 2022-12-24 ENCOUNTER — Encounter (HOSPITAL_BASED_OUTPATIENT_CLINIC_OR_DEPARTMENT_OTHER): Payer: Self-pay

## 2022-12-30 ENCOUNTER — Encounter: Payer: Self-pay | Admitting: Orthopedic Surgery

## 2022-12-31 ENCOUNTER — Encounter (HOSPITAL_BASED_OUTPATIENT_CLINIC_OR_DEPARTMENT_OTHER): Payer: Self-pay

## 2022-12-31 ENCOUNTER — Ambulatory Visit (HOSPITAL_BASED_OUTPATIENT_CLINIC_OR_DEPARTMENT_OTHER): Payer: Medicaid Other | Attending: Orthopedic Surgery

## 2022-12-31 DIAGNOSIS — R262 Difficulty in walking, not elsewhere classified: Secondary | ICD-10-CM | POA: Diagnosis present

## 2022-12-31 DIAGNOSIS — M6281 Muscle weakness (generalized): Secondary | ICD-10-CM | POA: Insufficient documentation

## 2022-12-31 DIAGNOSIS — M5459 Other low back pain: Secondary | ICD-10-CM | POA: Diagnosis present

## 2022-12-31 NOTE — Therapy (Signed)
OUTPATIENT PHYSICAL THERAPY TREATMENT   Patient Name: Ellen Booker MRN: [id number] DOB:11-Aug-1980, 42 y.o., female Today's Date: 12/31/2022  END OF SESSION:  PT End of Session - 12/31/22 1428     Visit Number 22    Number of Visits 27    Date for PT Re-Evaluation 01/11/23    Authorization Type [place] [employer]    Authorization - Number of Visits 27    PT Start Time 1435    PT Stop Time 1515    PT Time Calculation (min) 40 min    Activity Tolerance Patient tolerated treatment well    Behavior During Therapy WFL for tasks assessed/performed                    Past Medical History:  Diagnosis Date   Abdominal wall mass    left side   Anemia    Anxiety    Depression    Elevated cholesterol    GERD (gastroesophageal reflux disease)    History of Helicobacter pylori infection    per pt 2016   Renal cyst, right    per CT 04-04-2016   Vaginal Pap smear, abnormal    Wears glasses    Past Surgical History:  Procedure Laterality Date   back  08/2021   CESAREAN SECTION  2010   COLONOSCOPY  last one 06-19-2015   EXCISION ABDOMINAL WALL MASS  2012   right side   EXCISION MASS ABDOMINAL N/A 05/21/2016   Procedure: EXCISION ABDOMINAL WALL MASS;  Surgeon: Rodman Pickle, MD;  Location: [facility];  Service: General;  Laterality: N/A;   RESECTION OF ABDOMINAL MASS     Patient Active Problem List   Diagnosis Date Noted   Endometrioma 09/19/2022   Dysmenorrhea 02/01/2014   Tobacco dependency 02/01/2014   Obesity 12/09/2013   Essential hypertension, benign 12/09/2013   Candidiasis of vulva and vagina 12/08/2013   Bilateral nipple discharge 11/08/2013   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   Unspecified disorder of menstruation and other abnormal bleeding from female genital tract 11/08/2013   Irregular menstrual cycle 11/08/2013   Lower urinary tract infectious disease 01/29/2013   HSV (herpes simplex virus) infection 01/26/2013   Well  woman exam with routine gynecological exam 01/26/2013   BMI 40.0-44.9, adult (HCC) 01/26/2013   Elevated BP 01/26/2013    REFERRING PROVIDER:  Jonetta Speak, NP   REFERRING DIAG:  Diagnosis  Z98.1 (ICD-10-CM) - Arthrodesis status  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 Has not been out of the house a lot. Pt has been taking pain medication for R side of low back, which is hurting more lately. "It's like a deep throb lately". 0/10 pain level currently on her pain medication. LE's are still shakey with standing and has trouble standing for loner periods.   PERTINENT HISTORY:  Anxiety, h/o c-section  PAIN:  Are you having pain? No  PRECAUTIONS: None  WEIGHT BEARING RESTRICTIONS: No  FALLS:  Has patient fallen in last 6 months? Yes. Number of falls 2 6/17: fell about 2 weeks down an escalator 8/15: Had a fall down bleachers about 3 weeks ago at daughter's school  LIVING ENVIRONMENT: Lives with: lives with an adult companion Lives in: House/apartment Stairs: yes Has following equipment at home: Single point cane and Environmental consultant - 4 wheeled  OCCUPATION: not working  PLOF: Independent with basic ADLs  PATIENT GOALS: lift leg for more than a couple of seconds without shaking, regain strength, relieve sensation that back needs to pop   OBJECTIVE:   DIAGNOSTIC FINDINGS:  MRI 09/30/20: IMPRESSION: Status post L5-S1 decompression. A  large right paracentral and subarticular recess disc protrusion impinges on the right S1 root and deforms the right side of the thecal sac. There is also moderate right foraminal narrowing at this level.  Mild central canal and bilateral foraminal narrowing L4-5.  Xray lumbar completed 04/22/22.  PATIENT SURVEYS:  EVAL: LEFS: 21/80 09/09/22 LEFS: 17/80 11/12/22 LEFS: 15/80  COGNITIVE STATUS: Within functional limits for tasks assessed   POSTURE:  Eval: bil genu valgus and pronated through feet 6/17: Lt foot pronation, no valgus notable  GAIT: No AD at eval with slow cadence, short step length Comments: discussed trekking poles vs [facility]  6/17: with SPC in left hand, step through bilaterally, no scissoring, leaning over cane resulting in left lumbar shift  8/20: SPC in left hand, minimal pressure, slow cadence, good foot clearance   Body Part #1 Lumbar   Lumbar ROM LUMBAR ROM:   Active  A/PROM  eval AROM 6/17  Flexion Knee joint line Proximal 1/3 shin  Extension    Right lateral flexion Joint line Joint line  Left lateral flexion Joint line Joint line  Right rotation    Left rotation     (Blank rows = not tested)       Body Part #2 Hip//knee   LE MMT LOWER EXTREMITY MMT:    MMT Right eval Left eval Rt//Lt 6/17 Rt/Lt 8/20  Hip flexion 19.7 18.8 22.8/28.2 37.6/28.7  Hip extension      Hip abduction 28.5 21.5 27.5/24.5 29.4bil/20.5  Hip adduction      Hip internal rotation      Hip external rotation      Knee flexion 38.7 24.9 14.2/19.8 21.1/20.4  Knee extension 31.3 28.1 23.6/23.3 30.5/27.5   (Blank rows = not tested)     FUNCTIONAL TESTS:  30s chair stand: 5.5 wihtout UEs SLS: bil 2-3 s with ability to control balance correction without UEs 09/09/22: 30s sit to stand: 7 without use of UEs SLS: Rt 2-3 s, Lt 6s with ability correct LOB independently 8/20: 30s sit<>stand: 7 without use of UEs, shaking in legs noted SLS: bil 2-3s with controlled weght  shift and independent correction of LOB without UE use  TREATMENT:  Treatment                           10/8  LTR 5" x10 Piriformis stretch 30sec x3ea PPT 5" x10 Standing hip abd 2# 2x10ea LAQ 3# 5sec hold 2x10 Sit to stands x10 elevated plinth (cues for proper form) Standing march x10ea Standing HR/TR x15 Recumbent bike x47min L2   Treatment                           9/5  LTR 5" 2x10 Supine SLR 2x10ea with cues for TrA Sidelying abduction 2x10ea LAQ 3# 5sec hold 2x10 Sit to stands x10 Posterior slide lunges at rail x10ea HEP update and review   Treatment                           8/29 Cupping/STM to lumbar paraspinals in prone position  LTR 5" 2x10 SKTC 20sec x2ea Supine SLR x8ea with cues for TrA LAQ 3# 5sec hold x15ea Partial squats x15   Treatment                            8/20:  ERO-see clinical impression statement   Treatment                           8/15 Cupping/STM to lumbar paraspinals in prone position  LTR 5" 2x10 SKTC 30sec x2ea Thread the needle stretch in quadruped 5" x5ea Child's pose stretch 30sec x2 pre-cupping, x3 after LAQ 3# 5sec hold 2x10ea Partial squats 2x10   PATIENT EDUCATION:  Education details:  Anatomy of condition, POC, HEP, exercise form/rationale, pain neuroscience Person educated: Patient Education method: Explanation, Demonstration, Tactile cues, Verbal cues, Education comprehension: verbalized understanding, returned demonstration, verbal cues required, tactile cues required, and needs further education  HOME EXERCISE PROGRAM: [id number]   ASSESSMENT:  CLINICAL IMPRESSION: Pt with good tolerance for stretching and strength program. Weakness evident in LE's with standing tasks. Pt demonstrates difficulty with squat technique during sit to stands. She has recumbent bike at home, so trialled this  in clinic to monitor response. Educated pt on importance of LE strengthening at home to prevent falls and improve WB tolerance. Pt will have re-eval next session.   OBJECTIVE IMPAIRMENTS: Abnormal gait, decreased activity tolerance, decreased balance, difficulty walking, decreased strength, impaired perceived functional ability, increased muscle spasms, impaired flexibility, improper body mechanics, postural dysfunction, and pain.   ACTIVITY LIMITATIONS: carrying, lifting, bending, sitting, standing, squatting, stairs, transfers, bathing, dressing, locomotion level, and caring for others  PARTICIPATION LIMITATIONS: meal prep, cleaning, laundry, shopping, community activity, occupation, and yard work  PERSONAL FACTORS: Time since onset of injury/illness/exacerbation and anxiety, h/o c-section  are also affecting patient's functional outcome.   REHAB POTENTIAL: Good  CLINICAL DECISION MAKING: Evolving/moderate complexity  EVALUATION COMPLEXITY: Moderate   GOALS: Goals reviewed with patient? Yes  SHORT TERM GOALS: Target date: 4/19  Pt will be compliant with daily HEP performance Baseline: denies regular exercise since fall 2 weeks ago Goal status: ongoing  2.  Avoiding sustained seated positions greater than 45 min when able Baseline:  Goal status: achieved   LONG TERM GOALS: Target date: POC date  LEFS to improve by MDC Baseline: 9 point MDC Goal status: ongoing  2.  Able to walk through [facility] without feeling shaky Baseline:  no longer ordering online and going to store but tries to hurry so it doesn't take her more than 20 min Goal status: ongoing (still shakey after a few minutes) 10/8  3.  Gross MMT to improve by 10% Baseline: see obj Goal status: ongoing  4.  Pt will verbalize reduction in sensation of back "needing to pop" Baseline: unchanged Goal status:ongoing (single spot in R side of low back) 10/8     PLAN:  PT FREQUENCY: 1x/week  PT DURATION: 12  weeks  PLANNED INTERVENTIONS: Therapeutic exercises, Therapeutic activity, Neuromuscular re-education, Balance training, Gait training, Patient/Family education, Self Care, Joint mobilization, Stair training, Aquatic Therapy, Dry Needling, Spinal mobilization, Cryotherapy, Moist heat, Taping, Ionotophoresis 4mg /ml Dexamethasone, Manual therapy, and Re-evaluation.  PLAN FOR NEXT SESSION: Continue dry needling as needed, right SI joint mobilizations, ambulation endurance, hip flexor stretching  Riki Altes, PTA  12/31/22 4:04 PM    Check all possible CPT codes: 82956 - PT Re-evaluation, 97110- Therapeutic Exercise, [phone]- Neuro Re-education, [phone] - Gait Training, [phone] - Manual Therapy, [phone] - Therapeutic Activities, [phone] - Self Care, [phone] - Iontophoresis, and [phone] - Aquatic therapy    Check all conditions that are expected to impact treatment: Musculoskeletal disorders   If treatment provided at initial evaluation, no treatment charged due to lack of authorization.

## 2023-01-07 ENCOUNTER — Ambulatory Visit (HOSPITAL_BASED_OUTPATIENT_CLINIC_OR_DEPARTMENT_OTHER): Payer: Medicaid Other | Admitting: Physical Therapy

## 2023-01-07 ENCOUNTER — Encounter (HOSPITAL_BASED_OUTPATIENT_CLINIC_OR_DEPARTMENT_OTHER): Payer: Self-pay | Admitting: Physical Therapy

## 2023-01-07 DIAGNOSIS — R262 Difficulty in walking, not elsewhere classified: Secondary | ICD-10-CM

## 2023-01-07 DIAGNOSIS — M6281 Muscle weakness (generalized): Secondary | ICD-10-CM

## 2023-01-07 DIAGNOSIS — M5459 Other low back pain: Secondary | ICD-10-CM

## 2023-01-07 NOTE — Therapy (Signed)
OUTPATIENT PHYSICAL THERAPY TREATMENT   Patient Name: Ellen Booker MRN: [id number] DOB:[birth year], 42 y.o., female Today's Date: 01/07/2023  END OF SESSION:  PT End of Session - 01/07/23 1018     Visit Number 23    Authorization Type [employer]    Authorization - Number of Visits 27    PT Start Time 1018    PT Stop Time 1050    PT Time Calculation (min) 32 min    Activity Tolerance Patient tolerated treatment well    Behavior During Therapy WFL for tasks assessed/performed                    Past Medical History:  Diagnosis Date   Abdominal wall mass    left side   Anemia    Anxiety    Depression    Elevated cholesterol    GERD (gastroesophageal reflux disease)    History of Helicobacter pylori infection    per pt 2016   Renal cyst, right    per CT 04-04-2016   Vaginal Pap smear, abnormal    Wears glasses    Past Surgical History:  Procedure Laterality Date   back  08/2021   CESAREAN SECTION  2010   COLONOSCOPY  last one 06-19-2015   EXCISION ABDOMINAL WALL MASS  2012   right side   EXCISION MASS ABDOMINAL N/A 05/21/2016   Procedure: EXCISION ABDOMINAL WALL MASS;  Surgeon: Rodman Pickle, MD;  Location: [facility];  Service: General;  Laterality: N/A;   RESECTION OF ABDOMINAL MASS     Patient Active Problem List   Diagnosis Date Noted   Endometrioma 09/19/2022   Dysmenorrhea 02/01/2014   Tobacco dependency 02/01/2014   Obesity 12/09/2013   Essential hypertension, benign 12/09/2013   Candidiasis of vulva and vagina 12/08/2013   Bilateral nipple discharge 11/08/2013   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   Unspecified disorder of menstruation and other abnormal bleeding from female genital tract 11/08/2013   Irregular menstrual cycle 11/08/2013   Lower urinary tract infectious disease 01/29/2013   HSV (herpes simplex virus) infection 01/26/2013   Well woman exam with routine gynecological exam 01/26/2013   BMI  40.0-44.9, adult (HCC) 01/26/2013   Elevated BP 01/26/2013    REFERRING PROVIDER:  Jonetta Speak, NP   REFERRING DIAG:  Diagnosis  Z98.1 (ICD-10-CM) - Arthrodesis status  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 Have not really gone anywhere.  My back doesn't feel as tight.  I can walk longer, I go to the store instead of ordering out all the time. I still can't do the whole grocery store.   PERTINENT HISTORY:  Anxiety, h/o c-section  PAIN:  Are you having pain? No  PRECAUTIONS: None  WEIGHT BEARING RESTRICTIONS: No  FALLS:  Has patient fallen in last 6 months? Yes. Number of falls 2 6/17: fell about 2 weeks down an escalator 8/15: Had a fall down bleachers about 3 weeks ago at daughter's school 10/15: it has been a couple of weeks since falling.   LIVING ENVIRONMENT: Lives with: lives with an adult companion Lives in: House/apartment Stairs: yes Has following equipment at home: Single point cane and Environmental consultant - 4 wheeled  OCCUPATION: not working  PLOF: Independent with basic ADLs  PATIENT GOALS: lift leg for more than a couple of seconds without shaking, regain strength, relieve sensation that back needs to pop   OBJECTIVE:   DIAGNOSTIC FINDINGS:  MRI 09/30/20: IMPRESSION: Status post L5-S1 decompression. A large right paracentral and subarticular recess disc protrusion impinges on the right S1 root and deforms the  right side of the thecal sac. There is also moderate right foraminal narrowing at this level.  Mild central canal and bilateral foraminal narrowing L4-5.  Xray lumbar completed 04/22/22.  PATIENT SURVEYS:  EVAL: LEFS: 21/80 09/09/22 LEFS: 17/80 11/12/22 LEFS: 15/80 01/07/23 LEFS: 15/80  COGNITIVE STATUS: Within functional limits for tasks assessed   POSTURE:  Eval: bil genu valgus and pronated through feet 6/17: Lt foot pronation, no valgus notable  GAIT: No AD at eval with slow cadence, short step length Comments: discussed trekking poles vs [facility]  6/17: with SPC in left hand, step through bilaterally, no scissoring, leaning over cane resulting in left lumbar shift  8/20: SPC in left hand, minimal pressure, slow cadence, good foot clearance  10/15: leaning on cane in left hand- Rt LE abducted and right shoulder hike   Body Part #1 Lumbar   Lumbar ROM LUMBAR ROM:   Active  A/PROM  eval AROM 6/17 10/15  Flexion Knee joint line Proximal 1/3 shin   Extension     Right lateral flexion Joint line Joint line   Left lateral flexion Joint line Joint line   Right rotation     Left rotation      (Blank rows = not tested)       Body Part #2 Hip//knee   LE MMT LOWER EXTREMITY MMT:    MMT Right eval Left eval Rt//Lt 6/17 Rt/Lt 8/20 Rt/Lt 10/15  Hip flexion 19.7 18.8 22.8/28.2 37.6/28.7 33.3/32.1  Hip extension       Hip abduction 28.5 21.5 27.5/24.5 29.4bil/20.5 21.4/19.7  Hip adduction       Hip internal rotation       Hip external rotation       Knee flexion 38.7 24.9 14.2/19.8 21.1/20.4 11.1/13.7  Knee extension 31.3 28.1 23.6/23.3 30.5/27.5 18.9/16.1   (Blank rows = not tested)     FUNCTIONAL TESTS:  30s chair stand: 5.5 wihtout UEs SLS: bil 2-3 s with ability to control balance correction without UEs 09/09/22: 30s sit to stand: 7 without use of UEs SLS: Rt 2-3 s, Lt 6s with ability correct LOB independently 8/20: 30s sit<>stand: 7 without use of UEs,  shaking in legs noted SLS: bil 2-3s with controlled weght shift and independent correction of  LOB without UE use 10/15: 30s sit<>stand: 7.5 without use of UEs SLS: 3s bilaterally with independent correction of LOB.   TREATMENT:                                                                                                                              Treatment                            10/15:  See impression   Treatment                           10/8  LTR 5" x10 Piriformis stretch 30sec x3ea PPT 5" x10 Standing hip abd 2# 2x10ea LAQ 3# 5sec hold 2x10 Sit to stands x10 elevated plinth (cues for proper form) Standing march x10ea Standing HR/TR x15 Recumbent bike x40min L2   Treatment                           9/5  LTR 5" 2x10 Supine SLR 2x10ea with cues for TrA Sidelying abduction 2x10ea LAQ 3# 5sec hold 2x10 Sit to stands x10 Posterior slide lunges at rail x10ea HEP update and review   Treatment                           8/29 Cupping/STM to lumbar paraspinals in prone position  LTR 5" 2x10 SKTC 20sec x2ea Supine SLR x8ea with cues for TrA LAQ 3# 5sec hold x15ea Partial squats x15   Treatment                            8/20:  ERO-see clinical impression statement   Treatment                           8/15 Cupping/STM to lumbar paraspinals in prone position  LTR 5" 2x10 SKTC 30sec x2ea Thread the needle stretch in quadruped 5" x5ea Child's pose stretch 30sec x2 pre-cupping, x3 after LAQ 3# 5sec hold 2x10ea Partial squats 2x10   PATIENT EDUCATION:  Education details:  Anatomy of condition, POC, HEP, exercise form/rationale, pain neuroscience Person educated: Patient Education method: Explanation, Demonstration, Tactile cues, Verbal cues, Education comprehension: verbalized understanding, returned demonstration, verbal cues required, tactile cues required, and needs further education  HOME EXERCISE PROGRAM: [id number] - at least 5  days/week   ASSESSMENT:  CLINICAL IMPRESSION: Tests and measured revealed limited change as well as significant decline in strength. We spent our time together discussing rationale for visit to neurologist in order to test for underlying neurological disease that could contribute to this. She has been doing her exercises about 3 days/week and I asked that she try to increase as tolerate and to call PCP for f/u  visit. Will place her on hold for 30 days to determine appropriate POC with PCP/other specialty. Has 4 remaining PT visits allowed in the calendar year for insurance.   OBJECTIVE IMPAIRMENTS: Abnormal gait, decreased activity tolerance, decreased balance, difficulty walking, decreased strength, impaired perceived functional ability, increased muscle spasms, impaired flexibility, improper body mechanics, postural dysfunction, and pain.   ACTIVITY LIMITATIONS: carrying, lifting, bending, sitting, standing, squatting, stairs, transfers, bathing, dressing, locomotion level, and caring for others  PARTICIPATION LIMITATIONS: meal prep, cleaning, laundry, shopping, community activity, occupation, and yard work  PERSONAL FACTORS: Time since onset of injury/illness/exacerbation and anxiety, h/o c-section  are also affecting patient's functional outcome.   REHAB POTENTIAL: Good  CLINICAL DECISION MAKING: Evolving/moderate complexity  EVALUATION COMPLEXITY: Moderate   GOALS: Goals reviewed with patient? Yes  SHORT TERM GOALS: Target date: 4/19  Pt will be compliant with daily HEP performance Baseline: denies regular exercise since fall 2 weeks ago Goal status: ongoing  2.  Avoiding sustained seated positions greater than 45 min when able Baseline:  Goal status: achieved   LONG TERM GOALS: Target date: POC date  LEFS to improve by MDC Baseline: 9 point MDC Goal status: ongoing  2.  Able to walk through [facility] without feeling shaky Baseline: no longer ordering online and going  to store but tries to hurry so it doesn't take her more than 20 min Goal status: ongoing (still shakey after a few minutes) 10/8  3.  Gross MMT to improve by 10% Baseline: see obj Goal status: ongoing  4.  Pt will verbalize reduction in sensation of back "needing to pop" Baseline: unchanged Goal status:ongoing (single spot in R side of low back) 10/8     PLAN:  PT FREQUENCY: 1x/week  PT DURATION: 12 weeks  PLANNED INTERVENTIONS: Therapeutic exercises, Therapeutic activity, Neuromuscular re-education, Balance training, Gait training, Patient/Family education, Self Care, Joint mobilization, Stair training, Aquatic Therapy, Dry Needling, Spinal mobilization, Cryotherapy, Moist heat, Taping, Ionotophoresis 4mg /ml Dexamethasone, Manual therapy, and Re-evaluation.  PLAN FOR NEXT SESSION: Continue dry needling as needed, right SI joint mobilizations, ambulation endurance, hip flexor stretching   Mattia Osterman C. Maraki Macquarrie PT, DPT 01/07/23 10:57 AM     Check all possible CPT codes: 16109 - PT Re-evaluation, 97110- Therapeutic Exercise, [phone]- Neuro Re-education, [phone] - Gait Training, [phone] - Manual Therapy, [phone] - Therapeutic Activities, [phone] - Self Care, [phone] - Iontophoresis, and [phone] - Aquatic therapy    Check all conditions that are expected to impact treatment: Musculoskeletal disorders   If treatment provided at initial evaluation, no treatment charged due to lack of authorization.

## 2023-01-10 ENCOUNTER — Other Ambulatory Visit: Payer: Self-pay | Admitting: Orthopedic Surgery

## 2023-01-11 ENCOUNTER — Encounter (HOSPITAL_BASED_OUTPATIENT_CLINIC_OR_DEPARTMENT_OTHER): Payer: Self-pay | Admitting: Physical Therapy

## 2023-01-13 ENCOUNTER — Other Ambulatory Visit (HOSPITAL_BASED_OUTPATIENT_CLINIC_OR_DEPARTMENT_OTHER): Payer: Self-pay

## 2023-01-13 MED ORDER — WEGOVY 1 MG/0.5ML ~~LOC~~ SOAJ
1.0000 mg | SUBCUTANEOUS | 0 refills | Status: DC
  Filled 2023-01-13: qty 2, 28d supply, fill #0

## 2023-02-07 ENCOUNTER — Encounter: Payer: Self-pay | Admitting: Orthopedic Surgery

## 2023-02-07 MED ORDER — TIZANIDINE HCL 6 MG PO CAPS: 6.0000 mg | ORAL_CAPSULE | Freq: Three times a day (TID) | ORAL | 3 refills | Status: DC | PRN

## 2023-02-07 MED ORDER — IBUPROFEN 800 MG PO TABS: 800.0000 mg | ORAL_TABLET | Freq: Three times a day (TID) | ORAL | 5 refills | Status: AC | PRN

## 2023-02-10 ENCOUNTER — Other Ambulatory Visit (HOSPITAL_BASED_OUTPATIENT_CLINIC_OR_DEPARTMENT_OTHER): Payer: Self-pay

## 2023-02-10 MED ORDER — WEGOVY 1.7 MG/0.75ML ~~LOC~~ SOAJ
1.7000 mg | SUBCUTANEOUS | 0 refills | Status: DC
  Filled 2023-02-10: qty 3, 28d supply, fill #0

## 2023-02-19 ENCOUNTER — Encounter: Payer: Self-pay | Admitting: Orthopedic Surgery

## 2023-02-19 ENCOUNTER — Telehealth: Payer: Self-pay | Admitting: Orthopedic Surgery

## 2023-02-19 NOTE — Telephone Encounter (Signed)
Patient has submitted disability forms. Last seen by Dr. Christell Constant 04/22/22. Please advise patients work status.

## 2023-02-24 ENCOUNTER — Telehealth: Payer: Self-pay | Admitting: Orthopedic Surgery

## 2023-02-24 ENCOUNTER — Encounter: Payer: Self-pay | Admitting: Radiology

## 2023-02-24 NOTE — Telephone Encounter (Signed)
[employer] forms received. To [employer].

## 2023-02-24 NOTE — Telephone Encounter (Signed)
Noted for [employer]. 

## 2023-02-28 ENCOUNTER — Encounter: Payer: Self-pay | Admitting: Orthopedic Surgery

## 2023-03-10 ENCOUNTER — Other Ambulatory Visit (HOSPITAL_BASED_OUTPATIENT_CLINIC_OR_DEPARTMENT_OTHER): Payer: Self-pay

## 2023-03-10 MED ORDER — WEGOVY 2.4 MG/0.75ML ~~LOC~~ SOAJ
2.4000 mg | SUBCUTANEOUS | 0 refills | Status: DC
  Filled 2023-03-10: qty 3, 28d supply, fill #0

## 2023-03-12 NOTE — Progress Notes (Unsigned)
Telemedicine Visit:  Participants on the conference : myself and patient   The patient consented to this consultation and was aware that a charge will be placed through their insurance.  They were also made aware of the limitations of telemedicine.  I was in my office and the patient was at home.   Encounter time:  Total time 30 minutes, with 20 minutes spent with patient on Caregility   _____________________________________________________________________________________________            Summary of GI issues:  Vomiting and dysphagia since early 2024.  Clinical details outlined in office consult note May 2024.  Unrevealing upper endoscopy May 2024. Patient is on GLP-1 agonist, and reported that symptoms had started well before starting that medicine.  Gastric emptying study showed mild delay at the 3 to 4-hour mark which I thought might be due to the GLP-1 agonist.  She reported severe ongoing vomiting with weight loss, so a trial of metoclopramide was given in September 2024.  She then had a consultation/second opinion with Dr. Concepcion Elk at the digestive health practice on 01/28/2023.  Advised to continue MiraLAX and Dulcolax with the possible use of Linzess or Amitiza if that was not helpful.  Barium study was reportedly ordered for dysphagia.  If that was done, I am not able to access the report in this EHR.  [facility] Progress Note  Chief Complaint: Nausea and vomiting, dysphagia, constipation  Subjective  History:  Ellen Booker reports that the metoclopramide 5 mg once daily has significantly reduced her nausea and vomiting.  She will still have episodes that occur for a day or 2 and then resolved.  She has not noticed any clear triggers or relieving factors for those.  She still struggles with constipation, having stools that are firm and she feels as if there is something in the rectum that will not pass.  I reminded her she had a colonoscopy in March 2017 for constipation  and abdominal pain, no obstruction found.  She was given Linzess by the practice noted above, but found that it only gave loose stool and she felt like she still had "a blockage".  She also still has dysphagia where some meds and food feel stuck in the throat and "it is like I forget how to swallow".   ROS: Cardiovascular:  no chest pain Respiratory: no dyspnea Denies headache, visual disturbance, gait unsteadiness, weakness numbness or tingling  She also does not have any reported side effect from the metoclopramide.  I discussed what tardive dyskinesia would look like in case that should occur. The patient's Past Medical, Family and Social History were reviewed and are on file in the EMR. Past Medical History:  Diagnosis Date   Abdominal wall mass    left side   Anemia    Anxiety    Depression    Elevated cholesterol    GERD (gastroesophageal reflux disease)    History of Helicobacter pylori infection    per pt 2016   Renal cyst, right    per CT 04-04-2016   Vaginal Pap smear, abnormal    Wears glasses     Past Surgical History:  Procedure Laterality Date   back  08/2021   CESAREAN SECTION  2010   COLONOSCOPY  last one 06-19-2015   EXCISION ABDOMINAL WALL MASS  2012   right side   EXCISION MASS ABDOMINAL N/A 05/21/2016   Procedure: EXCISION ABDOMINAL WALL MASS;  Surgeon: Rodman Pickle, MD;  Location: [facility];  Service: General;  Laterality: N/A;   RESECTION OF ABDOMINAL MASS      Objective:  Med list reviewed  Current Outpatient Medications:    buPROPion (WELLBUTRIN XL) 150 MG 24 hr tablet, Take 150 mg by mouth daily., Disp: , Rfl:    ibuprofen (ADVIL) 800 MG tablet, Take 1 tablet (800 mg total) by mouth every 8 (eight) hours as needed. Take with plenty of food and water. This medication can affect your kidneys and stomach so do not use it every day, Disp: 30 tablet, Rfl: 0   lamoTRIgine (LAMICTAL) 150 MG tablet, Take 150 mg by mouth 2 (two)  times daily., Disp: , Rfl:    meloxicam (MOBIC) 15 MG tablet, Take 15 mg by mouth daily. Prescribed by Dr. Romie Jumper with [facility], Disp: , Rfl:    METFORMIN & DIET MANAGE PROD PO, , Disp: , Rfl:    metoCLOPramide (REGLAN) 5 MG tablet, Take one tablet (5 mg total) by mouth twice daily, 30 to 60 minutes before breakfast and supper, Disp: 60 tablet, Rfl: 1   oxyCODONE-acetaminophen (PERCOCET) 10-325 MG tablet, Take 1 tablet by mouth every 6 (six) hours as needed for pain. Prescribed by Dr. Allena Katz at [facility] per pt., Disp: , Rfl:    rosuvastatin (CRESTOR) 10 MG tablet, Take by mouth., Disp: , Rfl:    Semaglutide-Weight Management 0.25 MG/0.5ML SOAJ, Inject 0.25 mg into the skin once a week at 9am., Disp: 2 mL, Rfl: 0   tizanidine (ZANAFLEX) 6 MG capsule, Take 1 capsule (6 mg total) by mouth 3 (three) times daily as needed (muscle spasms pain)., Disp: 50 capsule, Rfl: 3   topiramate (TOPAMAX) 50 MG tablet, Take 50 mg by mouth 2 (two) times daily., Disp: , Rfl:    traZODone (DESYREL) 100 MG tablet, Take 100 mg by mouth at bedtime., Disp: , Rfl:    varenicline (CHANTIX) 0.5 MG tablet, Take 0.5 mg by mouth 2 (two) times daily., Disp: , Rfl:    VRAYLAR 3 MG capsule, Take 3 mg by mouth daily., Disp: , Rfl:    VYVANSE 50 MG capsule, Take 50 mg by mouth every morning., Disp: , Rfl:    WEGOVY 2.4 MG/0.75ML SOAJ, Inject 2.4 mg into the skin once a week., Disp: 3 mL, Rfl: 0   Vital signs in last 24 hrs: There were no vitals filed for this visit. Wt Readings from Last 3 Encounters:  09/19/22 215 lb 8 oz (97.8 kg)  08/15/22 222 lb (100.7 kg)  08/13/22 222 lb (100.7 kg)    Physical Exam  No exam-telemedicine visit.  Labs:   ___________________________________________ Radiologic studies:   ____________________________________________ Other: Gastric emptying study report on file, reviewed, noted above  _____________________________________________ Assessment & Plan   Assessment: Encounter Diagnoses  Name Primary?   Nausea and vomiting in adult Yes   Chronic constipation    Dysphagia, pharyngoesophageal phase     Pharyngeal dysphagia, sounds like some cricopharyngeal dysfunction.  No previous stroke or known neurological condition.  Chronic functional constipation.  I explained to her the unclear nature of motility disorders, some available therapies and the need for long-term management.  I also recommended she see gynecology for a physical exam to rule out a rectocele.  Chronic nausea and vomiting for about a year, no obstruction on EGD.  Only mild delay on the fourth hour of a GES.  That suggest more of a functional nausea and vomiting than true gastroparesis.  Nevertheless, there could be a gastric dysrhythmia or  other such entity that is not amenable to diagnostic testing but is certainly known to existing cause the symptoms.  Those often respond to metoclopramide such as her symptoms have.  Plan: Modified barium study  Stop Linzess and start a trial of Trulance 3 mg once daily. She understands this could cause diarrhea, but it should be tried for a week or more at least. Can also try a glycerin suppository once or twice a week in case there is any impacted stool.  As noted above, see gynecology to rule out rectocele. Motegrity might be helpful for both her constipation and upper GI symptoms, but generally is not covered by Medicaid.  Continue metoclopramide at the current dose of 5 mg once daily.  If she has flares of the nausea and vomiting, she can take another dose or 2 for couple days during these flares.  Follow-up with APP to be arranged.    Charlie Pitter III

## 2023-03-13 ENCOUNTER — Other Ambulatory Visit (HOSPITAL_COMMUNITY): Payer: Self-pay | Admitting: *Deleted

## 2023-03-13 ENCOUNTER — Telehealth: Payer: Medicaid Other | Admitting: Gastroenterology

## 2023-03-13 ENCOUNTER — Other Ambulatory Visit: Payer: Self-pay

## 2023-03-13 ENCOUNTER — Telehealth: Payer: Self-pay

## 2023-03-13 DIAGNOSIS — K5909 Other constipation: Secondary | ICD-10-CM | POA: Diagnosis not present

## 2023-03-13 DIAGNOSIS — R112 Nausea with vomiting, unspecified: Secondary | ICD-10-CM

## 2023-03-13 DIAGNOSIS — R1314 Dysphagia, pharyngoesophageal phase: Secondary | ICD-10-CM | POA: Diagnosis not present

## 2023-03-13 DIAGNOSIS — R1310 Dysphagia, unspecified: Secondary | ICD-10-CM

## 2023-03-13 MED ORDER — METOCLOPRAMIDE HCL 5 MG PO TABS: ORAL_TABLET | ORAL | 2 refills | Status: AC

## 2023-03-13 MED ORDER — TRULANCE 3 MG PO TABS: 3.0000 mg | ORAL_TABLET | Freq: Every day | ORAL | 1 refills | Status: AC

## 2023-03-13 NOTE — Telephone Encounter (Signed)
Called patient to go over information for Mychart video , left voicemail.

## 2023-03-14 ENCOUNTER — Telehealth: Payer: Self-pay

## 2023-03-14 NOTE — Telephone Encounter (Signed)
Pharmacy Patient Advocate Encounter   Received notification from CoverMyMeds that prior authorization for Trulance 3 mg tablet is required/requested.   Insurance verification completed.   The patient is insured through [facility] .   Per test claim: PA required; PA started via CoverMyMeds. KEY [id number] . Waiting for clinical questions to populate.

## 2023-04-02 ENCOUNTER — Ambulatory Visit: Payer: Medicaid Other | Admitting: Orthopedic Surgery

## 2023-04-07 ENCOUNTER — Encounter: Payer: Self-pay | Admitting: Gastroenterology

## 2023-04-14 ENCOUNTER — Ambulatory Visit (HOSPITAL_COMMUNITY)
Admission: RE | Admit: 2023-04-14 | Discharge: 2023-04-14 | Disposition: A | Discharge status: Home / Self Care | Payer: Medicaid Other | Source: Ambulatory Visit | Attending: Physician Assistant | Admitting: Physician Assistant

## 2023-04-14 ENCOUNTER — Other Ambulatory Visit (HOSPITAL_BASED_OUTPATIENT_CLINIC_OR_DEPARTMENT_OTHER): Payer: Self-pay

## 2023-04-14 DIAGNOSIS — R1310 Dysphagia, unspecified: Secondary | ICD-10-CM | POA: Insufficient documentation

## 2023-04-14 DIAGNOSIS — K219 Gastro-esophageal reflux disease without esophagitis: Secondary | ICD-10-CM | POA: Insufficient documentation

## 2023-04-14 DIAGNOSIS — R1314 Dysphagia, pharyngoesophageal phase: Secondary | ICD-10-CM

## 2023-04-14 MED ORDER — WEGOVY 2.4 MG/0.75ML ~~LOC~~ SOAJ
2.4000 mg | SUBCUTANEOUS | 0 refills | Status: DC
  Filled 2023-04-14: qty 3, 28d supply, fill #0

## 2023-04-14 NOTE — Progress Notes (Signed)
Modified Barium Swallow Study  Patient Details  Name: Ellen Booker MRN: [id number] Date of Birth: 25-Jun-1980  Today's Date: 04/14/2023  Modified Barium Swallow completed.  Full report located under Chart Review in the Imaging Section.  History of Present Illness Pt is 43 yr old female seen for ouptatient MBS with history of GERD (barium esophagram revealed trace reflux 09/2022),  back surgery. She reports several times a month she "chokes on food", it gets stuck, she can't swallow and sometimes feels that she can't breathe starting January 2024. If she swallows again and gives it extra time then sensation subsides.   Clinical Impression Pt exhibited normal oropharyngeal swallow abilities. Orally she contained the boluses, transited, masticated in timely manner with complete clearance. No difficulty coordinating transit of pill with thin barium. Pt exhibited a safe and efficient pharyngeal swallow. Thin liquid was initiated at the pyriform sinuses and her strength and range of motion of pharyngeal structures was within normal limits which prevented any penetration or aspiration during the study. She was challenged with sequential sips thin liquid. Her pharyngoesophageal segment showed complete distention and duration without obstruction of flow. MBS does not diagnose below the level of the PES however barium pill was observed to transit the esophagus without difficulty. Having history of reflux her symptoms may be related to transient esophageal involvement. Recommend continue regular texture, thin liquids, pills with thin. Alternating liquids and solids may be of benefit. Factors that may increase risk of adverse event in presence of aspiration Rubye Oaks & Clearance Coots 2021):    Swallow Evaluation Recommendations Recommendations: PO diet PO Diet Recommendation: Regular;Thin liquids (Level 0) Liquid Administration via: Cup;Straw Medication Administration: Whole meds with liquid Supervision: Patient  able to self-feed Postural changes: Position pt fully upright for meals Oral care recommendations: Oral care BID (2x/day)      Royce Macadamia 04/14/2023,12:16 PM

## 2023-04-15 ENCOUNTER — Encounter: Payer: Self-pay | Admitting: Gastroenterology

## 2023-04-17 ENCOUNTER — Encounter (HOSPITAL_BASED_OUTPATIENT_CLINIC_OR_DEPARTMENT_OTHER): Payer: Self-pay | Admitting: Physical Therapy

## 2023-04-21 ENCOUNTER — Ambulatory Visit: Payer: Medicaid Other | Admitting: Orthopedic Surgery

## 2023-04-22 ENCOUNTER — Other Ambulatory Visit (HOSPITAL_BASED_OUTPATIENT_CLINIC_OR_DEPARTMENT_OTHER): Payer: Self-pay

## 2023-04-22 MED ORDER — WEGOVY 2.4 MG/0.75ML ~~LOC~~ SOAJ
2.4000 mg | SUBCUTANEOUS | 1 refills | Status: AC
  Filled 2023-04-22 – 2023-05-05 (×4): qty 3, 28d supply, fill #0
  Filled 2023-05-30: qty 3, 28d supply, fill #1

## 2023-04-24 ENCOUNTER — Other Ambulatory Visit (HOSPITAL_BASED_OUTPATIENT_CLINIC_OR_DEPARTMENT_OTHER): Payer: Self-pay

## 2023-04-24 ENCOUNTER — Telehealth: Payer: Medicaid Other | Admitting: Physician Assistant

## 2023-04-24 DIAGNOSIS — B9689 Other specified bacterial agents as the cause of diseases classified elsewhere: Secondary | ICD-10-CM | POA: Diagnosis not present

## 2023-04-24 DIAGNOSIS — N760 Acute vaginitis: Secondary | ICD-10-CM

## 2023-04-24 MED ORDER — FLUCONAZOLE 150 MG PO TABS
ORAL_TABLET | ORAL | 0 refills | Status: AC
  Filled 2023-04-24: qty 2, 2d supply, fill #0

## 2023-04-24 MED ORDER — METRONIDAZOLE 500 MG PO TABS
500.0000 mg | ORAL_TABLET | Freq: Two times a day (BID) | ORAL | 0 refills | Status: AC
  Filled 2023-04-24: qty 14, 7d supply, fill #0

## 2023-04-24 NOTE — Progress Notes (Signed)

## 2023-04-24 NOTE — Progress Notes (Signed)
I have spent 5 minutes in review of e-visit questionnaire, review and updating patient chart, medical decision making and response to patient.   Piedad Climes, PA-C

## 2023-04-29 ENCOUNTER — Other Ambulatory Visit (HOSPITAL_BASED_OUTPATIENT_CLINIC_OR_DEPARTMENT_OTHER): Payer: Self-pay

## 2023-05-05 ENCOUNTER — Other Ambulatory Visit (HOSPITAL_BASED_OUTPATIENT_CLINIC_OR_DEPARTMENT_OTHER): Payer: Self-pay

## 2023-05-07 ENCOUNTER — Ambulatory Visit: Payer: Medicaid Other | Admitting: Orthopedic Surgery

## 2023-05-07 ENCOUNTER — Other Ambulatory Visit (HOSPITAL_BASED_OUTPATIENT_CLINIC_OR_DEPARTMENT_OTHER): Payer: Self-pay

## 2023-05-14 ENCOUNTER — Ambulatory Visit: Payer: Medicaid Other | Admitting: Orthopedic Surgery

## 2023-05-22 ENCOUNTER — Ambulatory Visit: Payer: Medicaid Other | Admitting: Orthopedic Surgery

## 2023-05-22 ENCOUNTER — Other Ambulatory Visit (HOSPITAL_BASED_OUTPATIENT_CLINIC_OR_DEPARTMENT_OTHER): Payer: Self-pay

## 2023-05-22 MED ORDER — WEGOVY 2.4 MG/0.75ML ~~LOC~~ SOAJ
2.4000 mg | SUBCUTANEOUS | 1 refills | Status: AC
  Filled 2023-05-22 – 2023-07-25 (×2): qty 3, 28d supply, fill #0

## 2023-05-23 ENCOUNTER — Ambulatory Visit: Payer: Medicaid Other | Admitting: Physician Assistant

## 2023-05-31 ENCOUNTER — Other Ambulatory Visit (HOSPITAL_BASED_OUTPATIENT_CLINIC_OR_DEPARTMENT_OTHER): Payer: Self-pay

## 2023-06-04 ENCOUNTER — Other Ambulatory Visit (HOSPITAL_BASED_OUTPATIENT_CLINIC_OR_DEPARTMENT_OTHER): Payer: Self-pay

## 2023-06-16 ENCOUNTER — Other Ambulatory Visit (HOSPITAL_BASED_OUTPATIENT_CLINIC_OR_DEPARTMENT_OTHER): Payer: Self-pay

## 2023-06-16 ENCOUNTER — Telehealth: Payer: Self-pay

## 2023-06-16 MED ORDER — WEGOVY 2.4 MG/0.75ML ~~LOC~~ SOAJ
2.4000 mg | SUBCUTANEOUS | 1 refills | Status: AC
  Filled 2023-06-16 – 2023-06-27 (×3): qty 3, 28d supply, fill #0
  Filled 2024-02-26 (×2): qty 3, 28d supply, fill #1

## 2023-06-16 NOTE — Telephone Encounter (Signed)
 Pharmacy Patient Advocate Encounter   Received notification from CoverMyMeds that prior authorization for Trulance 3 mg tablets is required/requested.   Insurance verification completed.   The patient is insured through [facility] .   Per test claim: PA required; PA submitted to above mentioned insurance via CoverMyMeds Key/confirmation #/EOC [id number] Status is pending

## 2023-06-17 NOTE — Telephone Encounter (Signed)
 Pharmacy Patient Advocate Encounter  Received notification from [facility] that Prior Authorization for Trulance 3MG  tablets has been APPROVED from 06-16-2023 to 06-15-2024   PA #/Case ID/Reference #: [facility]

## 2023-06-17 NOTE — Telephone Encounter (Signed)
 Morrie Sheldon please forward to GI clinic.  I left the clinic in October!

## 2023-06-19 NOTE — Telephone Encounter (Signed)
 Marland Kitchen

## 2023-06-23 ENCOUNTER — Other Ambulatory Visit (HOSPITAL_BASED_OUTPATIENT_CLINIC_OR_DEPARTMENT_OTHER): Payer: Self-pay

## 2023-06-23 NOTE — Telephone Encounter (Signed)
 Attempted to reach the pt by phone.  Left message on machine to call back I have also sent a message to My Chart. She does view her messages

## 2023-06-27 ENCOUNTER — Other Ambulatory Visit (HOSPITAL_BASED_OUTPATIENT_CLINIC_OR_DEPARTMENT_OTHER): Payer: Self-pay

## 2023-07-14 ENCOUNTER — Encounter: Payer: Self-pay | Admitting: Radiology

## 2023-07-14 ENCOUNTER — Encounter: Payer: Self-pay | Admitting: Orthopedic Surgery

## 2023-07-14 ENCOUNTER — Telehealth: Payer: Self-pay | Admitting: Orthopedic Surgery

## 2023-07-14 NOTE — Telephone Encounter (Signed)
 IC,lmvm advising patient that per Dr. Sulema Endo, she will need to schedule an appointment and be seen prior to any more forms being completed. He last treated pt 03/2022. [phone] patient

## 2023-07-15 ENCOUNTER — Telehealth: Payer: Self-pay | Admitting: Orthopedic Surgery

## 2023-07-15 NOTE — Telephone Encounter (Signed)
 I received vm from patient stating that the form has a deadline of 07/14/23.  She is asking if Dr. Sulema Endo will complete the form before she is seen since the form is due(it's for a loan). Pts callback [phone]

## 2023-07-17 ENCOUNTER — Ambulatory Visit: Admitting: Orthopedic Surgery

## 2023-07-17 ENCOUNTER — Other Ambulatory Visit (INDEPENDENT_AMBULATORY_CARE_PROVIDER_SITE_OTHER): Payer: Self-pay

## 2023-07-17 DIAGNOSIS — M47816 Spondylosis without myelopathy or radiculopathy, lumbar region: Secondary | ICD-10-CM

## 2023-07-17 DIAGNOSIS — M5441 Lumbago with sciatica, right side: Secondary | ICD-10-CM

## 2023-07-17 DIAGNOSIS — M5442 Lumbago with sciatica, left side: Secondary | ICD-10-CM

## 2023-07-17 DIAGNOSIS — G8929 Other chronic pain: Secondary | ICD-10-CM | POA: Diagnosis not present

## 2023-07-17 NOTE — Progress Notes (Signed)
 Orthopedic Spine Surgery Office Note   Assessment: Patient is a 43 y.o. female with history of 3 lumbar surgeries (her most recent was a L5/S1 ALIF and posterior instrumented spinal fusion) who presents with low back pain and continued with leg shaking and unsteadiness     Plan: -Patient has tried physical therapy, narcotics, activity modification, Tylenol , NSAIDs, pain management, surgery, injections -Can continue with muscle relaxers to help with pain -Given her continued pain and unsteadiness, I do not think that she will be able to return to work.  It has not gotten better with time and multiple treatments that she has tried so I anticipate this will be chronic -Patient should return to office in 1 year, x-rays at next visit: AP/lateral/Flex/ex lumbar     Patient expressed understanding of the plan and all questions were answered to the patient's satisfaction.    ___________________________________________________________________________     History:   Patient is a 43 y.o. female who has a history of 3 lumbar surgeries with an outside surgeon who I had seen about a year ago.  She has had significant low back pain and unsteadiness since surgery.  She feels that the pain in her lower lumbar region.  She does not have any radiating leg pain.  She does notice continued shaking of her legs especially when she is doing anything more than a few minutes upright.  She has been using a cane to help with her balance.  She has done several rounds of PT but that has not helped with her balance.  She has been referred to neurology for her unsteadiness.  Has not noticed any new symptoms since she was last seen in the office.  Has mostly been using muscle relaxers to control her pain of late.  Has been unable to return to work.   Treatments tried: physical therapy, narcotics, activity modification, Tylenol , NSAIDs, pain management, surgery, injections     Physical Exam:   General: no acute distress,  appears stated age Neurologic: alert, answering questions appropriately, following commands Respiratory: unlabored breathing on room air, symmetric chest rise Psychiatric: appropriate affect, normal cadence to speech     MSK (spine):   -Strength exam                                                   Left                  Right EHL                              4/5                  4/5 TA                                 5/5                  5/5 GSC                             5/5                  5/5 Knee extension  5/5                  5/5 Hip flexion                    5/5                  5/5   -Sensory exam                           Sensation intact to light touch in L3-S1 nerve distributions of bilateral lower extremities   -Straight leg raise: negative -Clonus: no beats bilaterally   -Left hip exam: no pain through range of motion, negative stinchfield, negative FABER -Right hip exam: no pain through range of motion, negative stinchfield, negative FABER - Shaking of the legs seen throughout strength testing exam   Imaging: XRs of the lumbar spine from 07/13/2023 were independently reviewed and interpreted, showing posterior instrumentation at L5 and S1.  No lucency seen on the screws.  Screws not backed out.  There is an interbody device at L5/S1 that appears in appropriate position.  No fracture or dislocation seen.  No significant adjacent segment disease seen.  No evidence of instability on flexion/extension views.     Patient name: Ellen Booker Patient MRN: [id number] Date of visit: 07/17/23

## 2023-07-22 ENCOUNTER — Ambulatory Visit: Admitting: Physician Assistant

## 2023-07-24 ENCOUNTER — Encounter: Payer: Self-pay | Admitting: Orthopedic Surgery

## 2023-07-25 ENCOUNTER — Other Ambulatory Visit (HOSPITAL_BASED_OUTPATIENT_CLINIC_OR_DEPARTMENT_OTHER): Payer: Self-pay

## 2023-07-25 MED ORDER — TIZANIDINE HCL 4 MG PO CAPS: 8.0000 mg | ORAL_CAPSULE | Freq: Three times a day (TID) | ORAL | 0 refills | Status: DC | PRN

## 2023-07-25 NOTE — Addendum Note (Signed)
 Addended by: Colette Davies on: 07/25/2023 01:13 PM   Modules accepted: Orders

## 2023-07-28 NOTE — Telephone Encounter (Signed)
 IC,lmvm [phone], advised form is ready to be picked up at front desk

## 2023-08-04 ENCOUNTER — Other Ambulatory Visit (HOSPITAL_BASED_OUTPATIENT_CLINIC_OR_DEPARTMENT_OTHER): Payer: Self-pay

## 2023-08-04 MED ORDER — WEGOVY 2.4 MG/0.75ML ~~LOC~~ SOAJ
2.4000 mg | SUBCUTANEOUS | 1 refills | Status: AC
  Filled 2023-08-04: qty 3, 28d supply, fill #0
  Filled 2023-08-27: qty 3, 28d supply, fill #1

## 2023-08-07 ENCOUNTER — Ambulatory Visit: Admitting: Orthopedic Surgery

## 2023-08-19 ENCOUNTER — Other Ambulatory Visit (HOSPITAL_BASED_OUTPATIENT_CLINIC_OR_DEPARTMENT_OTHER): Payer: Self-pay

## 2023-08-19 ENCOUNTER — Encounter (HOSPITAL_BASED_OUTPATIENT_CLINIC_OR_DEPARTMENT_OTHER): Payer: Self-pay | Admitting: Physical Therapy

## 2023-08-27 ENCOUNTER — Other Ambulatory Visit (HOSPITAL_BASED_OUTPATIENT_CLINIC_OR_DEPARTMENT_OTHER): Payer: Self-pay

## 2023-08-27 MED ORDER — WEGOVY 2.4 MG/0.75ML ~~LOC~~ SOAJ
2.4000 mg | SUBCUTANEOUS | 14 refills | Status: AC
  Filled 2023-09-24: qty 3, 28d supply, fill #0
  Filled 2023-10-22: qty 3, 28d supply, fill #1
  Filled 2023-11-20: qty 3, 28d supply, fill #2
  Filled 2024-01-14: qty 3, 28d supply, fill #3

## 2023-09-24 ENCOUNTER — Other Ambulatory Visit (HOSPITAL_BASED_OUTPATIENT_CLINIC_OR_DEPARTMENT_OTHER): Payer: Self-pay

## 2023-10-22 ENCOUNTER — Other Ambulatory Visit (HOSPITAL_BASED_OUTPATIENT_CLINIC_OR_DEPARTMENT_OTHER): Payer: Self-pay

## 2023-10-29 ENCOUNTER — Other Ambulatory Visit (HOSPITAL_BASED_OUTPATIENT_CLINIC_OR_DEPARTMENT_OTHER): Payer: Self-pay

## 2023-10-29 MED ORDER — TIZANIDINE HCL 4 MG PO TABS
4.0000 mg | ORAL_TABLET | Freq: Four times a day (QID) | ORAL | 2 refills | Status: DC | PRN
  Filled 2023-10-30: qty 30, 8d supply, fill #0
  Filled 2023-11-26: qty 30, 8d supply, fill #1

## 2023-10-29 MED ORDER — PROPRANOLOL HCL ER 60 MG PO CP24
60.0000 mg | ORAL_CAPSULE | Freq: Every day | ORAL | 0 refills | Status: DC
  Filled 2023-10-30: qty 90, 90d supply, fill #0

## 2023-10-29 MED ORDER — NALOXONE HCL 4 MG/0.1ML NA LIQD: NASAL | 1 refills | Status: AC

## 2023-10-29 MED ORDER — ROSUVASTATIN CALCIUM 10 MG PO TABS
10.0000 mg | ORAL_TABLET | Freq: Every day | ORAL | 1 refills | Status: AC
  Filled 2024-02-26 – 2024-03-23 (×3): qty 90, 90d supply, fill #0

## 2023-10-29 MED ORDER — TRAZODONE HCL 100 MG PO TABS
100.0000 mg | ORAL_TABLET | Freq: Every day | ORAL | 1 refills | Status: AC
  Filled 2024-02-26 – 2024-04-27 (×3): qty 180, 90d supply, fill #0

## 2023-10-29 MED ORDER — VRAYLAR 4.5 MG PO CAPS: 4.5000 mg | ORAL_CAPSULE | Freq: Every day | ORAL | 0 refills | Status: AC

## 2023-10-29 MED ORDER — VARENICLINE TARTRATE 1 MG PO TABS: 1.0000 mg | ORAL_TABLET | Freq: Two times a day (BID) | ORAL | 1 refills | Status: AC

## 2023-10-29 MED ORDER — SEMAGLUTIDE-WEIGHT MANAGEMENT 0.25 MG/0.5ML ~~LOC~~ SOAJ: 0.2500 mg | SUBCUTANEOUS | 0 refills | Status: AC

## 2023-10-29 MED ORDER — TRAZODONE HCL 100 MG PO TABS
100.0000 mg | ORAL_TABLET | Freq: Every day | ORAL | 0 refills | Status: AC
  Filled 2023-10-30 – 2023-11-26 (×2): qty 180, 90d supply, fill #0

## 2023-10-29 MED ORDER — LAMOTRIGINE 200 MG PO TABS
200.0000 mg | ORAL_TABLET | Freq: Two times a day (BID) | ORAL | 1 refills | Status: AC
  Filled 2023-10-30 – 2023-10-31 (×2): qty 60, 30d supply, fill #0

## 2023-10-31 ENCOUNTER — Other Ambulatory Visit (HOSPITAL_BASED_OUTPATIENT_CLINIC_OR_DEPARTMENT_OTHER): Payer: Self-pay

## 2023-10-31 ENCOUNTER — Other Ambulatory Visit: Payer: Self-pay

## 2023-10-31 MED ORDER — VYVANSE 60 MG PO CAPS
60.0000 mg | ORAL_CAPSULE | Freq: Every morning | ORAL | 0 refills | Status: DC
  Filled 2023-10-31: qty 30, 30d supply, fill #0

## 2023-11-04 ENCOUNTER — Other Ambulatory Visit (HOSPITAL_BASED_OUTPATIENT_CLINIC_OR_DEPARTMENT_OTHER): Payer: Self-pay

## 2023-11-05 ENCOUNTER — Other Ambulatory Visit: Payer: Self-pay

## 2023-11-07 ENCOUNTER — Other Ambulatory Visit: Payer: Self-pay | Admitting: Medical Genetics

## 2023-11-07 ENCOUNTER — Other Ambulatory Visit (HOSPITAL_BASED_OUTPATIENT_CLINIC_OR_DEPARTMENT_OTHER): Payer: Self-pay

## 2023-11-20 ENCOUNTER — Other Ambulatory Visit (HOSPITAL_BASED_OUTPATIENT_CLINIC_OR_DEPARTMENT_OTHER): Payer: Self-pay

## 2023-11-24 ENCOUNTER — Other Ambulatory Visit (HOSPITAL_BASED_OUTPATIENT_CLINIC_OR_DEPARTMENT_OTHER): Payer: Self-pay

## 2023-11-24 MED ORDER — VYVANSE 60 MG PO CAPS
60.0000 mg | ORAL_CAPSULE | Freq: Every morning | ORAL | 0 refills | Status: AC
  Filled 2023-12-01: qty 30, 30d supply, fill #0

## 2023-11-25 ENCOUNTER — Encounter: Payer: Self-pay | Admitting: Orthopedic Surgery

## 2023-11-26 ENCOUNTER — Other Ambulatory Visit (HOSPITAL_BASED_OUTPATIENT_CLINIC_OR_DEPARTMENT_OTHER): Payer: Self-pay

## 2023-11-26 ENCOUNTER — Other Ambulatory Visit: Payer: Self-pay

## 2023-11-26 MED ORDER — TRINTELLIX 20 MG PO TABS
20.0000 mg | ORAL_TABLET | Freq: Every day | ORAL | 0 refills | Status: AC
  Filled 2023-11-26 – 2023-11-27 (×3): qty 90, 90d supply, fill #0

## 2023-11-26 MED ORDER — LAMOTRIGINE 200 MG PO TABS: 200.0000 mg | ORAL_TABLET | Freq: Two times a day (BID) | ORAL | 0 refills | Status: AC

## 2023-11-26 MED ORDER — VRAYLAR 4.5 MG PO CAPS
4.5000 mg | ORAL_CAPSULE | Freq: Every day | ORAL | 0 refills | Status: AC
  Filled 2023-11-26: qty 90, 90d supply, fill #0

## 2023-11-26 MED ORDER — PROPRANOLOL HCL ER 60 MG PO CP24
60.0000 mg | ORAL_CAPSULE | Freq: Every day | ORAL | 0 refills | Status: AC
  Filled 2024-02-26 – 2024-03-23 (×3): qty 90, 90d supply, fill #0

## 2023-11-26 MED ORDER — VYVANSE 60 MG PO CAPS
60.0000 mg | ORAL_CAPSULE | Freq: Every morning | ORAL | 0 refills | Status: AC
  Filled 2024-02-26 (×2): qty 30, 30d supply, fill #0

## 2023-11-27 ENCOUNTER — Other Ambulatory Visit (HOSPITAL_BASED_OUTPATIENT_CLINIC_OR_DEPARTMENT_OTHER): Payer: Self-pay

## 2023-12-01 ENCOUNTER — Other Ambulatory Visit (HOSPITAL_BASED_OUTPATIENT_CLINIC_OR_DEPARTMENT_OTHER): Payer: Self-pay

## 2023-12-03 ENCOUNTER — Encounter: Payer: Self-pay | Admitting: Orthopedic Surgery

## 2023-12-06 ENCOUNTER — Other Ambulatory Visit (HOSPITAL_BASED_OUTPATIENT_CLINIC_OR_DEPARTMENT_OTHER): Payer: Self-pay

## 2023-12-08 MED ORDER — METHOCARBAMOL 750 MG PO TABS: 750.0000 mg | ORAL_TABLET | Freq: Four times a day (QID) | ORAL | 0 refills | Status: DC | PRN

## 2023-12-11 ENCOUNTER — Other Ambulatory Visit (HOSPITAL_BASED_OUTPATIENT_CLINIC_OR_DEPARTMENT_OTHER): Payer: Self-pay

## 2023-12-11 MED ORDER — WEGOVY 2.4 MG/0.75ML ~~LOC~~ SOAJ
2.4000 mg | SUBCUTANEOUS | 1 refills | Status: DC
  Filled 2023-12-11: qty 3, 28d supply, fill #0
  Filled 2023-12-20: qty 3, 28d supply, fill #1

## 2023-12-12 ENCOUNTER — Other Ambulatory Visit (HOSPITAL_BASED_OUTPATIENT_CLINIC_OR_DEPARTMENT_OTHER): Payer: Self-pay

## 2023-12-16 ENCOUNTER — Other Ambulatory Visit (HOSPITAL_COMMUNITY)

## 2023-12-17 ENCOUNTER — Other Ambulatory Visit (HOSPITAL_BASED_OUTPATIENT_CLINIC_OR_DEPARTMENT_OTHER): Payer: Self-pay

## 2023-12-17 MED ORDER — LAMOTRIGINE 200 MG PO TABS
200.0000 mg | ORAL_TABLET | Freq: Two times a day (BID) | ORAL | 0 refills | Status: AC
  Filled 2023-12-17: qty 180, 90d supply, fill #0

## 2023-12-20 ENCOUNTER — Other Ambulatory Visit (HOSPITAL_BASED_OUTPATIENT_CLINIC_OR_DEPARTMENT_OTHER): Payer: Self-pay

## 2023-12-31 ENCOUNTER — Other Ambulatory Visit

## 2023-12-31 DIAGNOSIS — Z006 Encounter for examination for normal comparison and control in clinical research program: Secondary | ICD-10-CM

## 2024-01-01 ENCOUNTER — Other Ambulatory Visit

## 2024-01-01 ENCOUNTER — Other Ambulatory Visit: Payer: Self-pay | Admitting: Medical Genetics

## 2024-01-01 DIAGNOSIS — Z006 Encounter for examination for normal comparison and control in clinical research program: Secondary | ICD-10-CM

## 2024-01-06 ENCOUNTER — Other Ambulatory Visit (HOSPITAL_BASED_OUTPATIENT_CLINIC_OR_DEPARTMENT_OTHER): Payer: Self-pay

## 2024-01-06 MED ORDER — VYVANSE 60 MG PO CAPS
60.0000 mg | ORAL_CAPSULE | Freq: Every morning | ORAL | 0 refills | Status: AC
  Filled 2024-01-06: qty 30, 30d supply, fill #0

## 2024-01-10 LAB — GENECONNECT MOLECULAR SCREEN: Genetic Analysis Overall Interpretation: NEGATIVE

## 2024-01-14 ENCOUNTER — Other Ambulatory Visit (HOSPITAL_BASED_OUTPATIENT_CLINIC_OR_DEPARTMENT_OTHER): Payer: Self-pay

## 2024-01-19 ENCOUNTER — Other Ambulatory Visit: Payer: Self-pay

## 2024-01-19 ENCOUNTER — Other Ambulatory Visit (HOSPITAL_BASED_OUTPATIENT_CLINIC_OR_DEPARTMENT_OTHER): Payer: Self-pay

## 2024-01-19 MED ORDER — BUPROPION HCL ER (XL) 150 MG PO TB24
150.0000 mg | ORAL_TABLET | Freq: Every day | ORAL | 0 refills | Status: DC
  Filled 2024-01-19: qty 30, 30d supply, fill #0

## 2024-01-26 ENCOUNTER — Encounter: Payer: Self-pay | Admitting: Radiology

## 2024-01-27 ENCOUNTER — Other Ambulatory Visit (HOSPITAL_BASED_OUTPATIENT_CLINIC_OR_DEPARTMENT_OTHER): Payer: Self-pay

## 2024-01-27 MED ORDER — TRINTELLIX 10 MG PO TABS
10.0000 mg | ORAL_TABLET | Freq: Every day | ORAL | 0 refills | Status: AC
  Filled 2024-01-27: qty 30, 30d supply, fill #0

## 2024-02-02 ENCOUNTER — Other Ambulatory Visit (HOSPITAL_BASED_OUTPATIENT_CLINIC_OR_DEPARTMENT_OTHER): Payer: Self-pay

## 2024-02-02 MED ORDER — VYVANSE 60 MG PO CAPS
60.0000 mg | ORAL_CAPSULE | Freq: Every morning | ORAL | 0 refills | Status: AC
  Filled 2024-02-26: qty 30, 30d supply, fill #0

## 2024-02-03 ENCOUNTER — Other Ambulatory Visit (HOSPITAL_BASED_OUTPATIENT_CLINIC_OR_DEPARTMENT_OTHER): Payer: Self-pay

## 2024-02-03 MED ORDER — PHENTERMINE HCL 37.5 MG PO TABS
18.7500 mg | ORAL_TABLET | Freq: Every day | ORAL | 0 refills | Status: DC
  Filled 2024-02-03: qty 30, 30d supply, fill #0

## 2024-02-03 MED ORDER — BUPROPION HCL ER (XL) 150 MG PO TB24
150.0000 mg | ORAL_TABLET | Freq: Every day | ORAL | 0 refills | Status: DC
  Filled 2024-02-03 – 2024-03-23 (×4): qty 30, 30d supply, fill #0

## 2024-02-04 ENCOUNTER — Other Ambulatory Visit: Payer: Self-pay

## 2024-02-26 ENCOUNTER — Other Ambulatory Visit (HOSPITAL_COMMUNITY): Payer: Self-pay

## 2024-02-26 ENCOUNTER — Other Ambulatory Visit (HOSPITAL_BASED_OUTPATIENT_CLINIC_OR_DEPARTMENT_OTHER): Payer: Self-pay

## 2024-02-26 ENCOUNTER — Other Ambulatory Visit: Payer: Self-pay

## 2024-02-27 ENCOUNTER — Other Ambulatory Visit (HOSPITAL_COMMUNITY): Payer: Self-pay

## 2024-03-02 ENCOUNTER — Other Ambulatory Visit (HOSPITAL_BASED_OUTPATIENT_CLINIC_OR_DEPARTMENT_OTHER): Payer: Self-pay

## 2024-03-03 ENCOUNTER — Other Ambulatory Visit (HOSPITAL_BASED_OUTPATIENT_CLINIC_OR_DEPARTMENT_OTHER): Payer: Self-pay

## 2024-03-03 MED ORDER — VRAYLAR 4.5 MG PO CAPS
4.5000 mg | ORAL_CAPSULE | Freq: Every day | ORAL | 2 refills | Status: AC
  Filled 2024-03-03: qty 90, 90d supply, fill #0

## 2024-03-03 MED ORDER — PROPRANOLOL HCL ER 60 MG PO CP24
60.0000 mg | ORAL_CAPSULE | Freq: Every day | ORAL | 2 refills | Status: AC
  Filled 2024-03-03: qty 30, 30d supply, fill #0
  Filled 2024-04-27: qty 30, 30d supply, fill #1

## 2024-03-04 ENCOUNTER — Other Ambulatory Visit (HOSPITAL_BASED_OUTPATIENT_CLINIC_OR_DEPARTMENT_OTHER): Payer: Self-pay

## 2024-03-04 MED ORDER — VYVANSE 60 MG PO CAPS
60.0000 mg | ORAL_CAPSULE | Freq: Every morning | ORAL | 0 refills | Status: AC
  Filled 2024-03-04 (×2): qty 30, 30d supply, fill #0

## 2024-03-05 ENCOUNTER — Other Ambulatory Visit (HOSPITAL_BASED_OUTPATIENT_CLINIC_OR_DEPARTMENT_OTHER): Payer: Self-pay

## 2024-03-05 MED ORDER — VYVANSE 60 MG PO CAPS
60.0000 mg | ORAL_CAPSULE | Freq: Every morning | ORAL | 0 refills | Status: AC
  Filled 2024-03-05: qty 30, 30d supply, fill #0

## 2024-03-08 ENCOUNTER — Ambulatory Visit: Admitting: Orthopedic Surgery

## 2024-03-10 ENCOUNTER — Other Ambulatory Visit: Payer: Self-pay

## 2024-03-10 ENCOUNTER — Other Ambulatory Visit (HOSPITAL_BASED_OUTPATIENT_CLINIC_OR_DEPARTMENT_OTHER): Payer: Self-pay

## 2024-03-10 MED ORDER — LAMOTRIGINE 200 MG PO TABS
200.0000 mg | ORAL_TABLET | Freq: Two times a day (BID) | ORAL | 2 refills | Status: AC
  Filled 2024-03-10: qty 60, 30d supply, fill #0
  Filled 2024-04-27: qty 60, 30d supply, fill #1

## 2024-03-10 MED FILL — Trazodone HCl Tab 100 MG: 100.0000 mg | ORAL | 90 days supply | Qty: 180 | Fill #0 | Status: AC

## 2024-03-15 ENCOUNTER — Other Ambulatory Visit: Payer: Self-pay

## 2024-03-15 ENCOUNTER — Other Ambulatory Visit (HOSPITAL_BASED_OUTPATIENT_CLINIC_OR_DEPARTMENT_OTHER): Payer: Self-pay

## 2024-03-15 MED ORDER — VYVANSE 60 MG PO CAPS
60.0000 mg | ORAL_CAPSULE | Freq: Every morning | ORAL | 0 refills | Status: AC
  Filled 2024-03-15 – 2024-03-23 (×2): qty 30, 30d supply, fill #0

## 2024-03-23 ENCOUNTER — Other Ambulatory Visit (HOSPITAL_BASED_OUTPATIENT_CLINIC_OR_DEPARTMENT_OTHER): Payer: Self-pay

## 2024-03-23 ENCOUNTER — Other Ambulatory Visit: Payer: Self-pay

## 2024-03-24 ENCOUNTER — Encounter: Payer: Self-pay | Admitting: Orthopedic Surgery

## 2024-03-24 MED ORDER — METHOCARBAMOL 750 MG PO TABS: 750.0000 mg | ORAL_TABLET | Freq: Four times a day (QID) | ORAL | 2 refills | Status: AC | PRN

## 2024-03-30 ENCOUNTER — Other Ambulatory Visit (HOSPITAL_BASED_OUTPATIENT_CLINIC_OR_DEPARTMENT_OTHER): Payer: Self-pay

## 2024-03-30 MED ORDER — PHENTERMINE HCL 37.5 MG PO TABS
18.7500 mg | ORAL_TABLET | Freq: Every day | ORAL | 0 refills | Status: DC
  Filled 2024-03-30: qty 30, 30d supply, fill #0

## 2024-04-19 ENCOUNTER — Other Ambulatory Visit (HOSPITAL_BASED_OUTPATIENT_CLINIC_OR_DEPARTMENT_OTHER): Payer: Self-pay

## 2024-04-19 MED ORDER — WEGOVY 0.25 MG/0.5ML ~~LOC~~ SOAJ
0.2500 mg | SUBCUTANEOUS | 0 refills | Status: DC
  Filled 2024-04-19: qty 2, 28d supply, fill #0

## 2024-04-19 MED ORDER — IBUPROFEN 800 MG PO TABS
800.0000 mg | ORAL_TABLET | Freq: Three times a day (TID) | ORAL | 5 refills | Status: AC | PRN
  Filled 2024-04-19: qty 30, 10d supply, fill #0

## 2024-04-27 ENCOUNTER — Other Ambulatory Visit: Payer: Self-pay

## 2024-04-27 ENCOUNTER — Other Ambulatory Visit (HOSPITAL_BASED_OUTPATIENT_CLINIC_OR_DEPARTMENT_OTHER): Payer: Self-pay

## 2024-04-27 MED ORDER — WEGOVY 0.5 MG/0.5ML ~~LOC~~ SOAJ
SUBCUTANEOUS | 0 refills | Status: AC
  Filled 2024-04-27: qty 2, 28d supply, fill #0

## 2024-04-27 MED ORDER — WEGOVY 0.5 MG/0.5ML ~~LOC~~ SOAJ
0.5000 mg | SUBCUTANEOUS | 0 refills | Status: AC
  Filled 2024-04-27: qty 2, 28d supply, fill #0

## 2024-04-28 ENCOUNTER — Other Ambulatory Visit (HOSPITAL_BASED_OUTPATIENT_CLINIC_OR_DEPARTMENT_OTHER): Payer: Self-pay

## 2024-04-30 ENCOUNTER — Other Ambulatory Visit (HOSPITAL_COMMUNITY): Payer: Self-pay

## 2024-04-30 ENCOUNTER — Other Ambulatory Visit (HOSPITAL_BASED_OUTPATIENT_CLINIC_OR_DEPARTMENT_OTHER): Payer: Self-pay

## 2024-04-30 ENCOUNTER — Other Ambulatory Visit: Payer: Self-pay
